# Patient Record
Sex: Female | Born: 1945 | Race: White | Hispanic: No | Marital: Single | Smoking: Never smoker
Health system: Southern US, Community
[De-identification: ages and names within clinical notes are randomized; demographics above are authoritative.]

## PROBLEM LIST (undated history)

## (undated) DIAGNOSIS — E785 Hyperlipidemia, unspecified: Secondary | ICD-10-CM

## (undated) DIAGNOSIS — I2510 Atherosclerotic heart disease of native coronary artery without angina pectoris: Secondary | ICD-10-CM

## (undated) DIAGNOSIS — N189 Chronic kidney disease, unspecified: Secondary | ICD-10-CM

## (undated) DIAGNOSIS — E039 Hypothyroidism, unspecified: Secondary | ICD-10-CM

## (undated) DIAGNOSIS — T148XXA Other injury of unspecified body region, initial encounter: Secondary | ICD-10-CM

## (undated) DIAGNOSIS — I10 Essential (primary) hypertension: Secondary | ICD-10-CM

## (undated) DIAGNOSIS — E669 Obesity, unspecified: Secondary | ICD-10-CM

## (undated) DIAGNOSIS — R42 Dizziness and giddiness: Secondary | ICD-10-CM

## (undated) DIAGNOSIS — I4891 Unspecified atrial fibrillation: Secondary | ICD-10-CM

## (undated) DIAGNOSIS — E079 Disorder of thyroid, unspecified: Secondary | ICD-10-CM

## (undated) DIAGNOSIS — R011 Cardiac murmur, unspecified: Secondary | ICD-10-CM

## (undated) DIAGNOSIS — M109 Gout, unspecified: Secondary | ICD-10-CM

## (undated) DIAGNOSIS — I340 Nonrheumatic mitral (valve) insufficiency: Secondary | ICD-10-CM

## (undated) DIAGNOSIS — M8580 Other specified disorders of bone density and structure, unspecified site: Secondary | ICD-10-CM

## (undated) HISTORY — DX: Other injury of unspecified body region, initial encounter: T14.8XXA

## (undated) HISTORY — DX: Obesity, unspecified: E66.9

## (undated) HISTORY — DX: Essential (primary) hypertension: I10

## (undated) HISTORY — DX: Disorder of thyroid, unspecified: E07.9

## (undated) HISTORY — DX: Other specified disorders of bone density and structure, unspecified site: M85.80

## (undated) HISTORY — DX: Chronic kidney disease, unspecified: N18.9

## (undated) HISTORY — DX: Cardiac murmur, unspecified: R01.1

## (undated) HISTORY — DX: Gout, unspecified: M10.9

## (undated) HISTORY — DX: Hyperlipidemia, unspecified: E78.5

---

## 2004-11-24 ENCOUNTER — Emergency Department: Payer: Self-pay | Admitting: Emergency Medicine

## 2006-01-29 ENCOUNTER — Ambulatory Visit: Payer: Self-pay | Admitting: Unknown Physician Specialty

## 2008-04-16 HISTORY — PX: OTHER SURGICAL HISTORY: SHX169

## 2008-08-14 DIAGNOSIS — I482 Chronic atrial fibrillation, unspecified: Secondary | ICD-10-CM | POA: Insufficient documentation

## 2009-02-24 ENCOUNTER — Ambulatory Visit: Payer: Self-pay | Admitting: Unknown Physician Specialty

## 2010-06-08 ENCOUNTER — Ambulatory Visit: Payer: Self-pay

## 2011-08-06 ENCOUNTER — Ambulatory Visit: Payer: Self-pay

## 2011-10-12 ENCOUNTER — Ambulatory Visit: Payer: Self-pay | Admitting: Cardiovascular Disease

## 2011-10-12 HISTORY — PX: CARDIAC CATHETERIZATION: SHX172

## 2012-03-14 ENCOUNTER — Ambulatory Visit: Payer: Self-pay | Admitting: Emergency Medicine

## 2012-06-02 HISTORY — PX: UPPER GI ENDOSCOPY: SHX6162

## 2012-08-13 DIAGNOSIS — E669 Obesity, unspecified: Secondary | ICD-10-CM | POA: Insufficient documentation

## 2012-08-13 DIAGNOSIS — E079 Disorder of thyroid, unspecified: Secondary | ICD-10-CM | POA: Insufficient documentation

## 2012-08-13 DIAGNOSIS — E063 Autoimmune thyroiditis: Secondary | ICD-10-CM | POA: Insufficient documentation

## 2012-08-13 DIAGNOSIS — I10 Essential (primary) hypertension: Secondary | ICD-10-CM | POA: Insufficient documentation

## 2012-08-14 DIAGNOSIS — Z8719 Personal history of other diseases of the digestive system: Secondary | ICD-10-CM | POA: Insufficient documentation

## 2012-08-14 DIAGNOSIS — M109 Gout, unspecified: Secondary | ICD-10-CM | POA: Insufficient documentation

## 2012-08-14 HISTORY — PX: HERNIA REPAIR: SHX51

## 2012-10-20 ENCOUNTER — Ambulatory Visit: Payer: Self-pay

## 2013-08-20 ENCOUNTER — Encounter: Payer: Self-pay | Admitting: *Deleted

## 2013-09-09 ENCOUNTER — Ambulatory Visit (INDEPENDENT_AMBULATORY_CARE_PROVIDER_SITE_OTHER): Payer: Medicare Other | Admitting: General Surgery

## 2013-09-09 ENCOUNTER — Other Ambulatory Visit: Payer: Medicare Other

## 2013-09-09 ENCOUNTER — Encounter: Payer: Self-pay | Admitting: General Surgery

## 2013-09-09 VITALS — BP 140/80 | HR 74 | Resp 14 | Ht 62.0 in | Wt 176.0 lb

## 2013-09-09 DIAGNOSIS — N641 Fat necrosis of breast: Secondary | ICD-10-CM

## 2013-09-09 DIAGNOSIS — N63 Unspecified lump in unspecified breast: Secondary | ICD-10-CM
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
## 2013-09-09 NOTE — Patient Instructions (Signed)
Patient to return as needed. 

## 2013-09-10 ENCOUNTER — Encounter: Payer: Self-pay | Admitting: General Surgery

## 2013-09-10 DIAGNOSIS — N63 Unspecified lump in unspecified breast: Secondary | ICD-10-CM | POA: Insufficient documentation

## 2013-09-10 DIAGNOSIS — N641 Fat necrosis of breast: Secondary | ICD-10-CM | POA: Insufficient documentation

## 2013-10-21 ENCOUNTER — Ambulatory Visit: Payer: Self-pay

## 2014-02-15 ENCOUNTER — Encounter: Payer: Self-pay | Admitting: General Surgery

## 2014-03-08 ENCOUNTER — Ambulatory Visit: Payer: Self-pay | Admitting: Unknown Physician Specialty

## 2014-05-17 DIAGNOSIS — I2510 Atherosclerotic heart disease of native coronary artery without angina pectoris: Secondary | ICD-10-CM | POA: Diagnosis not present

## 2014-05-17 DIAGNOSIS — I4891 Unspecified atrial fibrillation: Secondary | ICD-10-CM | POA: Diagnosis not present

## 2014-05-17 DIAGNOSIS — E785 Hyperlipidemia, unspecified: Secondary | ICD-10-CM | POA: Diagnosis not present

## 2014-05-17 DIAGNOSIS — I340 Nonrheumatic mitral (valve) insufficiency: Secondary | ICD-10-CM | POA: Diagnosis not present

## 2014-05-21 DIAGNOSIS — I482 Chronic atrial fibrillation: Secondary | ICD-10-CM | POA: Diagnosis not present

## 2014-05-21 DIAGNOSIS — E785 Hyperlipidemia, unspecified: Secondary | ICD-10-CM | POA: Diagnosis not present

## 2014-05-21 DIAGNOSIS — E039 Hypothyroidism, unspecified: Secondary | ICD-10-CM | POA: Diagnosis not present

## 2014-05-21 DIAGNOSIS — E789 Disorder of lipoprotein metabolism, unspecified: Secondary | ICD-10-CM | POA: Diagnosis not present

## 2014-05-21 DIAGNOSIS — I129 Hypertensive chronic kidney disease with stage 1 through stage 4 chronic kidney disease, or unspecified chronic kidney disease: Secondary | ICD-10-CM | POA: Diagnosis not present

## 2014-05-21 DIAGNOSIS — O039 Complete or unspecified spontaneous abortion without complication: Secondary | ICD-10-CM | POA: Diagnosis not present

## 2014-06-03 DIAGNOSIS — G4733 Obstructive sleep apnea (adult) (pediatric): Secondary | ICD-10-CM | POA: Diagnosis not present

## 2014-06-25 DIAGNOSIS — G4733 Obstructive sleep apnea (adult) (pediatric): Secondary | ICD-10-CM | POA: Diagnosis not present

## 2014-07-01 DIAGNOSIS — I4891 Unspecified atrial fibrillation: Secondary | ICD-10-CM | POA: Diagnosis not present

## 2014-07-01 DIAGNOSIS — G4733 Obstructive sleep apnea (adult) (pediatric): Secondary | ICD-10-CM | POA: Diagnosis not present

## 2014-07-01 DIAGNOSIS — I2510 Atherosclerotic heart disease of native coronary artery without angina pectoris: Secondary | ICD-10-CM | POA: Diagnosis not present

## 2014-07-01 DIAGNOSIS — I10 Essential (primary) hypertension: Secondary | ICD-10-CM | POA: Diagnosis not present

## 2014-08-09 LAB — SURGICAL PATHOLOGY

## 2014-10-11 DIAGNOSIS — G4730 Sleep apnea, unspecified: Secondary | ICD-10-CM | POA: Diagnosis not present

## 2014-10-11 DIAGNOSIS — I2510 Atherosclerotic heart disease of native coronary artery without angina pectoris: Secondary | ICD-10-CM | POA: Diagnosis not present

## 2014-10-11 DIAGNOSIS — I4891 Unspecified atrial fibrillation: Secondary | ICD-10-CM | POA: Diagnosis not present

## 2014-10-31 ENCOUNTER — Other Ambulatory Visit: Payer: Self-pay | Admitting: Unknown Physician Specialty

## 2014-11-04 ENCOUNTER — Other Ambulatory Visit: Payer: Self-pay | Admitting: Family Medicine

## 2014-11-04 DIAGNOSIS — R011 Cardiac murmur, unspecified: Secondary | ICD-10-CM | POA: Insufficient documentation

## 2014-11-04 DIAGNOSIS — I129 Hypertensive chronic kidney disease with stage 1 through stage 4 chronic kidney disease, or unspecified chronic kidney disease: Secondary | ICD-10-CM

## 2014-11-04 DIAGNOSIS — N183 Chronic kidney disease, stage 3 unspecified: Secondary | ICD-10-CM | POA: Insufficient documentation

## 2014-11-04 DIAGNOSIS — E039 Hypothyroidism, unspecified: Secondary | ICD-10-CM

## 2014-11-04 DIAGNOSIS — E785 Hyperlipidemia, unspecified: Secondary | ICD-10-CM | POA: Insufficient documentation

## 2014-11-04 DIAGNOSIS — I482 Chronic atrial fibrillation, unspecified: Secondary | ICD-10-CM

## 2014-11-04 DIAGNOSIS — M109 Gout, unspecified: Secondary | ICD-10-CM

## 2014-11-04 DIAGNOSIS — M85852 Other specified disorders of bone density and structure, left thigh: Secondary | ICD-10-CM | POA: Insufficient documentation

## 2014-11-04 DIAGNOSIS — E669 Obesity, unspecified: Secondary | ICD-10-CM

## 2014-11-04 DIAGNOSIS — M8580 Other specified disorders of bone density and structure, unspecified site: Secondary | ICD-10-CM | POA: Insufficient documentation

## 2014-11-04 DIAGNOSIS — Z1231 Encounter for screening mammogram for malignant neoplasm of breast: Secondary | ICD-10-CM

## 2014-11-04 DIAGNOSIS — I340 Nonrheumatic mitral (valve) insufficiency: Secondary | ICD-10-CM | POA: Insufficient documentation

## 2014-11-08 ENCOUNTER — Encounter: Payer: Self-pay | Admitting: Unknown Physician Specialty

## 2014-11-08 ENCOUNTER — Ambulatory Visit (INDEPENDENT_AMBULATORY_CARE_PROVIDER_SITE_OTHER): Payer: Medicare Other | Admitting: Unknown Physician Specialty

## 2014-11-08 VITALS — BP 141/84 | HR 65 | Temp 98.4°F | Ht 60.5 in | Wt 182.6 lb

## 2014-11-08 DIAGNOSIS — E785 Hyperlipidemia, unspecified: Secondary | ICD-10-CM

## 2014-11-08 DIAGNOSIS — N182 Chronic kidney disease, stage 2 (mild): Secondary | ICD-10-CM

## 2014-11-08 DIAGNOSIS — N185 Chronic kidney disease, stage 5: Secondary | ICD-10-CM | POA: Diagnosis not present

## 2014-11-08 DIAGNOSIS — N189 Chronic kidney disease, unspecified: Secondary | ICD-10-CM

## 2014-11-08 DIAGNOSIS — N181 Chronic kidney disease, stage 1: Secondary | ICD-10-CM

## 2014-11-08 DIAGNOSIS — N183 Chronic kidney disease, stage 3 (moderate): Secondary | ICD-10-CM | POA: Diagnosis not present

## 2014-11-08 DIAGNOSIS — E039 Hypothyroidism, unspecified: Secondary | ICD-10-CM | POA: Diagnosis not present

## 2014-11-08 DIAGNOSIS — E669 Obesity, unspecified: Secondary | ICD-10-CM | POA: Diagnosis not present

## 2014-11-08 DIAGNOSIS — N184 Chronic kidney disease, stage 4 (severe): Secondary | ICD-10-CM | POA: Diagnosis not present

## 2014-11-08 DIAGNOSIS — I129 Hypertensive chronic kidney disease with stage 1 through stage 4 chronic kidney disease, or unspecified chronic kidney disease: Secondary | ICD-10-CM

## 2014-11-08 LAB — LIPID PANEL PICCOLO, WAIVED
Chol/HDL Ratio Piccolo,Waive: 1.8 mg/dL
Cholesterol Piccolo, Waived: 130 mg/dL (ref <200)
HDL Chol Piccolo, Waived: 71 mg/dL (ref >59)
LDL Chol Calc Piccolo Waived: 41 mg/dL (ref <100)
Triglycerides Piccolo,Waived: 89 mg/dL (ref <150)
VLDL Chol Calc Piccolo,Waive: 18 mg/dL (ref <30)

## 2014-11-08 LAB — MICROALBUMIN, URINE WAIVED
Creatinine, Urine Waived: 50 mg/dL (ref 10–300)
Microalb, Ur Waived: 10 mg/L (ref 0–19)
Microalb/Creat Ratio: <30 mg/g (ref <30)

## 2014-11-08 LAB — BAYER DCA HB A1C WAIVED: HB A1C (BAYER DCA - WAIVED): 5.7 % (ref <7.0)

## 2014-11-08 MED ORDER — LEVOTHYROXINE SODIUM 75 MCG PO TABS: 75.0000 ug | ORAL_TABLET | Freq: Every day | ORAL | Status: DC

## 2014-11-08 MED ORDER — AMLODIPINE BESYLATE 2.5 MG PO TABS: 2.5000 mg | ORAL_TABLET | Freq: Every day | ORAL | Status: DC

## 2014-11-08 NOTE — Progress Notes (Signed)
BP 141/84 mmHg  Pulse 65  Temp(Src) 98.4 F (36.9 C)  Ht 5' 0.5" (1.537 m)  Wt 182 lb 9.6 oz (82.827 kg)  BMI 35.06 kg/m2  SpO2 98%  LMP  (LMP Unknown)   Subjective:    Patient ID: Amy Myers, female    DOB: 29-Aug-1945, 69 y.o.   MRN: [id number]  HPI: Amy Myers is a 69 y.o. female  Chief Complaint  Patient presents with  . Hypertension  . Hyperlipidemia    HYPERTENSION / HYPERLIPIDEMIA Satisfied with current treatment?  yes  H6  Duration of hypertension:  chronic  H4  BP monitoring frequency:  a few times a week  H5  BP range:  130s  60-70s H3 BP medication side effects:  no P1 Duration of hyperlipidemia:  chronic  H4  Cholesterol medication side effects:  no P1  Cholesterol supplements:  none  P1 Past cholesterol medications:  none  P1 Medication compliance:  excellent compliance  P1  Aspirin:  Aspirin Therapy Done on 10/31/10 Recent stressors:  no  H6   Recurrent headaches:  no  R10 Visual changes:  no  R2  Palpitations:  yes  R4  Dyspnea:  no  R5  Chest pain:  no  R4  Lower extremity edema:  no  R4  Dizzy/lightheaded:  no  R10    Relevant past medical, surgical, family and social history reviewed and updated as indicated. Interim medical history since our last visit reviewed. Allergies and medications reviewed and updated.  Review of Systems  Per HPI unless specifically indicated above     Objective:    BP 141/84 mmHg  Pulse 65  Temp(Src) 98.4 F (36.9 C)  Ht 5' 0.5" (1.537 m)  Wt 182 lb 9.6 oz (82.827 kg)  BMI 35.06 kg/m2  SpO2 98%  LMP  (LMP Unknown)  Wt Readings from Last 3 Encounters:  11/08/14 182 lb 9.6 oz (82.827 kg)  05/21/14 178 lb (80.74 kg)  09/09/13 176 lb (79.833 kg)    Physical Exam  Constitutional: She is oriented to person, place, and time. She appears well-developed and well-nourished. No distress.  HENT:  Head: Normocephalic and atraumatic.  Eyes: Conjunctivae and lids are normal. Right eye exhibits no discharge.  Left eye exhibits no discharge. No scleral icterus.  Cardiovascular: Normal rate, regular rhythm and normal heart sounds.   Pulmonary/Chest: Effort normal and breath sounds normal. No respiratory distress.  Abdominal: Normal appearance. There is no splenomegaly or hepatomegaly.  Musculoskeletal: Normal range of motion.  Neurological: She is alert and oriented to person, place, and time.  Skin: Skin is intact. No rash noted. No pallor.  Psychiatric: She has a normal mood and affect. Her behavior is normal. Judgment and thought content normal.  Vitals reviewed.     Assessment & Plan:   Problem List Items Addressed This Visit      Unprioritized   Hypertensive CKD (chronic kidney disease)   Relevant Medications   amLODipine (NORVASC) 2.5 MG tablet   Other Relevant Orders   Microalbumin, Urine Waived   Uric acid   Comprehensive metabolic panel   Obesity   Relevant Orders   Bayer DCA Hb A1c Waived   Hyperlipidemia - Primary   Relevant Medications   amLODipine (NORVASC) 2.5 MG tablet   Other Relevant Orders   Lipid Panel Piccolo, Waived   Hypothyroidism   Relevant Medications   levothyroxine (SYNTHROID, LEVOTHROID) 75 MCG tablet   Other Relevant Orders   TSH  All stable.  Continue present meds.  Await TSH   Follow up plan: Return in about 4 weeks (around 12/06/2014) for physical.

## 2014-11-08 NOTE — Patient Instructions (Signed)

## 2014-11-09 ENCOUNTER — Other Ambulatory Visit: Payer: Self-pay | Admitting: Family Medicine

## 2014-11-09 ENCOUNTER — Encounter: Payer: Self-pay | Admitting: Unknown Physician Specialty

## 2014-11-09 ENCOUNTER — Ambulatory Visit
Admission: RE | Admit: 2014-11-09 | Discharge: 2014-11-09 | Disposition: A | Discharge status: Home / Self Care | Payer: Medicare Other | Source: Ambulatory Visit | Attending: Family Medicine | Admitting: Family Medicine

## 2014-11-09 DIAGNOSIS — Z1231 Encounter for screening mammogram for malignant neoplasm of breast: Secondary | ICD-10-CM | POA: Diagnosis not present

## 2014-11-09 LAB — COMPREHENSIVE METABOLIC PANEL
ALT: 8 IU/L (ref 0–32)
AST: 11 IU/L (ref 0–40)
Albumin/Globulin Ratio: 2 (ref 1.1–2.5)
Albumin: 4.1 g/dL (ref 3.5–4.8)
Alkaline Phosphatase: 70 IU/L (ref 39–117)
BUN/Creatinine Ratio: 14 (ref 11–26)
BUN: 15 mg/dL (ref 8–27)
Bilirubin Total: 0.4 mg/dL (ref 0.0–1.2)
CO2: 21 mmol/L (ref 18–29)
Calcium: 9.2 mg/dL (ref 8.7–10.3)
Chloride: 100 mmol/L (ref 97–108)
Creatinine, Ser: 1.04 mg/dL — ABNORMAL HIGH (ref 0.57–1.00)
GFR calc Af Amer: 63 mL/min/{1.73_m2} (ref >59)
GFR calc non Af Amer: 55 mL/min/{1.73_m2} — ABNORMAL LOW (ref >59)
Globulin, Total: 2.1 g/dL (ref 1.5–4.5)
Glucose: 94 mg/dL (ref 65–99)
Potassium: 4.5 mmol/L (ref 3.5–5.2)
Sodium: 139 mmol/L (ref 134–144)
Total Protein: 6.2 g/dL (ref 6.0–8.5)

## 2014-11-09 LAB — URIC ACID: Uric Acid: 4.4 mg/dL (ref 2.5–7.1)

## 2014-11-09 LAB — TSH: TSH: 1.58 u[IU]/mL (ref 0.450–4.500)

## 2014-11-30 ENCOUNTER — Other Ambulatory Visit: Payer: Self-pay | Admitting: Unknown Physician Specialty

## 2014-12-08 ENCOUNTER — Encounter: Payer: Medicare Other | Admitting: Unknown Physician Specialty

## 2014-12-13 ENCOUNTER — Other Ambulatory Visit: Payer: Self-pay

## 2014-12-13 DIAGNOSIS — E039 Hypothyroidism, unspecified: Secondary | ICD-10-CM

## 2014-12-13 MED ORDER — RALOXIFENE HCL 60 MG PO TABS: 60.0000 mg | ORAL_TABLET | Freq: Every day | ORAL | Status: DC

## 2014-12-13 NOTE — Telephone Encounter (Signed)
Patient was last seen on 11/08/14 and pharmacy is [facility].

## 2014-12-24 ENCOUNTER — Ambulatory Visit (INDEPENDENT_AMBULATORY_CARE_PROVIDER_SITE_OTHER): Payer: Medicare Other | Admitting: Unknown Physician Specialty

## 2014-12-24 ENCOUNTER — Encounter: Payer: Self-pay | Admitting: Unknown Physician Specialty

## 2014-12-24 VITALS — BP 139/81 | HR 61 | Temp 98.4°F | Ht 60.7 in | Wt 184.0 lb

## 2014-12-24 DIAGNOSIS — N183 Chronic kidney disease, stage 3 (moderate): Secondary | ICD-10-CM

## 2014-12-24 DIAGNOSIS — N185 Chronic kidney disease, stage 5: Secondary | ICD-10-CM

## 2014-12-24 DIAGNOSIS — N181 Chronic kidney disease, stage 1: Secondary | ICD-10-CM

## 2014-12-24 DIAGNOSIS — N182 Chronic kidney disease, stage 2 (mild): Secondary | ICD-10-CM | POA: Diagnosis not present

## 2014-12-24 DIAGNOSIS — I129 Hypertensive chronic kidney disease with stage 1 through stage 4 chronic kidney disease, or unspecified chronic kidney disease: Secondary | ICD-10-CM

## 2014-12-24 DIAGNOSIS — N189 Chronic kidney disease, unspecified: Secondary | ICD-10-CM

## 2014-12-24 DIAGNOSIS — N184 Chronic kidney disease, stage 4 (severe): Secondary | ICD-10-CM | POA: Diagnosis not present

## 2014-12-24 DIAGNOSIS — E039 Hypothyroidism, unspecified: Secondary | ICD-10-CM

## 2014-12-24 DIAGNOSIS — Z0000 Encounter for general adult medical examination without abnormal findings: Secondary | ICD-10-CM

## 2014-12-24 MED ORDER — ALLOPURINOL 100 MG PO TABS: 100.0000 mg | ORAL_TABLET | Freq: Every day | ORAL | Status: DC

## 2014-12-24 MED ORDER — LEVOTHYROXINE SODIUM 75 MCG PO TABS: 75.0000 ug | ORAL_TABLET | Freq: Every day | ORAL | Status: DC

## 2014-12-24 MED ORDER — RALOXIFENE HCL 60 MG PO TABS: 60.0000 mg | ORAL_TABLET | Freq: Every day | ORAL | Status: DC

## 2014-12-24 MED ORDER — AMLODIPINE BESYLATE 2.5 MG PO TABS: 2.5000 mg | ORAL_TABLET | Freq: Every day | ORAL | Status: DC

## 2014-12-24 NOTE — Progress Notes (Signed)
BP 139/81 mmHg  Pulse 61  Temp(Src) 98.4 F (36.9 C)  Ht 5' 0.7" (1.542 m)  Wt 184 lb (83.462 kg)  BMI 35.10 kg/m2  SpO2 98%  LMP  (LMP Unknown)   Subjective:    Patient ID: Amy Myers, female    DOB: [birth year], 69 y.o.   MRN: [id number]  HPI: Amy Myers is a 69 y.o. female  Chief Complaint  Patient presents with  . Medicare Wellness   Relevant past medical, surgical, family and social history reviewed and updated as indicated. Interim medical history since our last visit reviewed. Allergies and medications reviewed and updated.  See functional status, depression screen, and fall's risk assessment  under the appropriate section.  Care team updated  Pt is able to perform complex mental tasks, recognize clock face, recognize time and do a 3 item recall.    Advance Directives:  Pt has not had power of attorney or advance directive forms.  She does want to be resucitated if she has a cardiac or respiratory arrest.    Relevant past medical, surgical, family and social history reviewed and updated as indicated. Interim medical history since our last visit reviewed. Allergies and medications reviewed and updated.  Review of Systems  Constitutional: Negative.   HENT: Negative.   Eyes: Negative.   Respiratory: Negative.   Cardiovascular: Negative.   Gastrointestinal: Negative.   Endocrine: Negative.   Genitourinary: Negative.   Musculoskeletal: Negative.   Skin: Negative.   Allergic/Immunologic: Negative.   Neurological: Negative.   Hematological: Negative.   Psychiatric/Behavioral: Negative.     Per HPI unless specifically indicated above     Objective:    BP 139/81 mmHg  Pulse 61  Temp(Src) 98.4 F (36.9 C)  Ht 5' 0.7" (1.542 m)  Wt 184 lb (83.462 kg)  BMI 35.10 kg/m2  SpO2 98%  LMP  (LMP Unknown)  Wt Readings from Last 3 Encounters:  12/24/14 184 lb (83.462 kg)  11/08/14 182 lb 9.6 oz (82.827 kg)  05/21/14 178 lb (80.74 kg)    Physical Exam   Constitutional: She is oriented to person, place, and time. She appears well-developed and well-nourished.  HENT:  Head: Normocephalic and atraumatic.  Eyes: Pupils are equal, round, and reactive to light. Right eye exhibits no discharge. Left eye exhibits no discharge. No scleral icterus.  Neck: Normal range of motion. Neck supple. Carotid bruit is not present. No thyromegaly present.  Cardiovascular: Normal rate, regular rhythm and normal heart sounds.  Exam reveals no gallop and no friction rub.   No murmur heard. Pulmonary/Chest: Effort normal and breath sounds normal. No respiratory distress. She has no wheezes. She has no rales.  Abdominal: Soft. Bowel sounds are normal. There is no tenderness. There is no rebound.  Genitourinary: No breast swelling, tenderness or discharge.  Musculoskeletal: Normal range of motion.  Lymphadenopathy:    She has no cervical adenopathy.  Neurological: She is alert and oriented to person, place, and time.  Skin: Skin is warm, dry and intact. No rash noted.  Psychiatric: She has a normal mood and affect. Her speech is normal and behavior is normal. Judgment and thought content normal. Cognition and memory are normal.  Nursing note and vitals reviewed.   Results for orders placed or performed in visit on 11/08/14  Lipid Panel Piccolo, [employer]  Result Value Ref Range   Cholesterol Piccolo, Waived 130 <200 mg/dL   HDL Chol Piccolo, Waived 71 >59 mg/dL   Triglycerides Piccolo,Waived 89 <150  mg/dL   Chol/HDL Ratio Piccolo,Waive 1.8 mg/dL   LDL Chol Calc Piccolo Waived 41 <100 mg/dL   VLDL Chol Calc Piccolo,Waive 18 <30 mg/dL  Bayer DCA Hb A1c Waived  Result Value Ref Range   Bayer DCA Hb A1c Waived 5.7 <7.0 %  Microalbumin, Urine Waived  Result Value Ref Range   Microalb, Ur Waived 10 0 - 19 mg/L   Creatinine, Urine Waived 50 10 - 300 mg/dL   Microalb/Creat Ratio <30 <30 mg/g  Uric acid  Result Value Ref Range   Uric Acid 4.4 2.5 - 7.1 mg/dL   Comprehensive metabolic panel  Result Value Ref Range   Glucose 94 65 - 99 mg/dL   BUN 15 8 - 27 mg/dL   Creatinine, Ser 1.04 (H) 0.57 - 1.00 mg/dL   GFR calc non Af Amer 55 (L) >59 mL/min/1.73   GFR calc Af Amer 63 >59 mL/min/1.73   BUN/Creatinine Ratio 14 11 - 26   Sodium 139 134 - 144 mmol/L   Potassium 4.5 3.5 - 5.2 mmol/L   Chloride 100 97 - 108 mmol/L   CO2 21 18 - 29 mmol/L   Calcium 9.2 8.7 - 10.3 mg/dL   Total Protein 6.2 6.0 - 8.5 g/dL   Albumin 4.1 3.5 - 4.8 g/dL   Globulin, Total 2.1 1.5 - 4.5 g/dL   Albumin/Globulin Ratio 2.0 1.1 - 2.5   Bilirubin Total 0.4 0.0 - 1.2 mg/dL   Alkaline Phosphatase 70 39 - 117 IU/L   AST 11 0 - 40 IU/L   ALT 8 0 - 32 IU/L  TSH  Result Value Ref Range   TSH 1.580 0.450 - 4.500 uIU/mL      Assessment & Plan:   Medicare wellness  Follow up plan: Return in about 6 months (around 06/23/2015).

## 2015-01-11 DIAGNOSIS — I10 Essential (primary) hypertension: Secondary | ICD-10-CM | POA: Diagnosis not present

## 2015-01-11 DIAGNOSIS — I340 Nonrheumatic mitral (valve) insufficiency: Secondary | ICD-10-CM | POA: Diagnosis not present

## 2015-01-11 DIAGNOSIS — I2510 Atherosclerotic heart disease of native coronary artery without angina pectoris: Secondary | ICD-10-CM | POA: Diagnosis not present

## 2015-01-11 DIAGNOSIS — I4891 Unspecified atrial fibrillation: Secondary | ICD-10-CM | POA: Diagnosis not present

## 2015-01-11 DIAGNOSIS — I341 Nonrheumatic mitral (valve) prolapse: Secondary | ICD-10-CM | POA: Diagnosis not present

## 2015-01-31 ENCOUNTER — Other Ambulatory Visit: Payer: Self-pay

## 2015-01-31 MED ORDER — ALLOPURINOL 100 MG PO TABS: 100.0000 mg | ORAL_TABLET | Freq: Every day | ORAL | Status: DC

## 2015-01-31 NOTE — Telephone Encounter (Signed)
PATIENT: Amy Myers DOB: [birth year] PHARMACY: [facility] LAST VISIT: 12/24/2014  Patient requests allopurinol 100 mg tab.   I contacted pharmacy because the prescription was written 12/24/2014 with 5 refills and the directions say take one tablet daily. The pharmacist says that she's been taking two tablets a day for a while, and if she was supposed to take only one now, that wasn't clear to her and she ran out early. This is why the pharmacist faxed in a new request with directions 2 tablets daily. I told pharmacist I would clarify with you what amount you wanted patient to take.

## 2015-02-03 ENCOUNTER — Other Ambulatory Visit: Payer: Self-pay

## 2015-02-03 NOTE — Telephone Encounter (Signed)
PATIENT: Amy Myers LAST VISIT: 12/21/2014  [name]

## 2015-02-04 MED ORDER — LISINOPRIL 40 MG PO TABS: 40.0000 mg | ORAL_TABLET | Freq: Every day | ORAL | Status: DC

## 2015-03-01 ENCOUNTER — Telehealth: Payer: Self-pay

## 2015-03-01 NOTE — Telephone Encounter (Signed)
Pharmacy sent a fax stating that the patient's prescription for allopurinol was written to take one tablet once daily but the patient is supposed to be taking 2 tablets daily. They request a new prescription be sent with the correct directions.

## 2015-03-01 NOTE — Telephone Encounter (Signed)
OK to wait for Amy Myers 

## 2015-03-02 MED ORDER — ALLOPURINOL 100 MG PO TABS: 100.0000 mg | ORAL_TABLET | Freq: Two times a day (BID) | ORAL | Status: DC

## 2015-06-13 DIAGNOSIS — I2510 Atherosclerotic heart disease of native coronary artery without angina pectoris: Secondary | ICD-10-CM | POA: Diagnosis not present

## 2015-06-13 DIAGNOSIS — I10 Essential (primary) hypertension: Secondary | ICD-10-CM | POA: Diagnosis not present

## 2015-06-13 DIAGNOSIS — I4891 Unspecified atrial fibrillation: Secondary | ICD-10-CM | POA: Diagnosis not present

## 2015-06-22 DIAGNOSIS — R079 Chest pain, unspecified: Secondary | ICD-10-CM | POA: Diagnosis not present

## 2015-06-24 ENCOUNTER — Ambulatory Visit (INDEPENDENT_AMBULATORY_CARE_PROVIDER_SITE_OTHER): Payer: Medicare Other | Admitting: Unknown Physician Specialty

## 2015-06-24 ENCOUNTER — Telehealth: Payer: Self-pay

## 2015-06-24 ENCOUNTER — Encounter: Payer: Self-pay | Admitting: Unknown Physician Specialty

## 2015-06-24 VITALS — BP 121/82 | HR 76 | Temp 97.8°F | Ht 59.3 in | Wt 184.2 lb

## 2015-06-24 DIAGNOSIS — N183 Chronic kidney disease, stage 3 unspecified: Secondary | ICD-10-CM

## 2015-06-24 DIAGNOSIS — I129 Hypertensive chronic kidney disease with stage 1 through stage 4 chronic kidney disease, or unspecified chronic kidney disease: Secondary | ICD-10-CM

## 2015-06-24 DIAGNOSIS — E785 Hyperlipidemia, unspecified: Secondary | ICD-10-CM | POA: Diagnosis not present

## 2015-06-24 LAB — LIPID PANEL PICCOLO, WAIVED
Chol/HDL Ratio Piccolo,Waive: 1.8 mg/dL
Cholesterol Piccolo, Waived: 128 mg/dL (ref <200)
HDL Chol Piccolo, Waived: 71 mg/dL (ref >59)
LDL Chol Calc Piccolo Waived: 41 mg/dL (ref <100)
Triglycerides Piccolo,Waived: 84 mg/dL (ref <150)
VLDL Chol Calc Piccolo,Waive: 17 mg/dL (ref <30)

## 2015-06-24 NOTE — Telephone Encounter (Signed)
Patient came in for an appointment and stated she got a flu shot at [facility] so I called them and they stated the patient got her flu shot 01/13/15.

## 2015-06-24 NOTE — Progress Notes (Signed)
----------------------------------------------------------------------------  BP 121/82 mmHg  Pulse 76  Temp(Src) 97.8 F (36.6 C)  Ht 4' 11.3" (1.506 m)  Wt 184 lb 3.2 oz (83.553 kg)  BMI 36.84 kg/m2  SpO2 97%  LMP  (LMP Unknown)   Subjective:    Patient ID: Amy Myers, female    DOB: [birth year], 70 y.o.   MRN: [id number]  HPI: Amy Myers is a 70 y.o. female  Chief Complaint  Patient presents with  . Hyperlipidemia  . Hypertension  . Hypothyroidism   Hypertension Using medications without difficulty.   Average home BP: 122/70  No problems or lightheadedness No chest pain with exertion or shortness of breath No Edema   Hyperlipidemia Using medications without problems No Muscle aches  Diet compliance: Mostly eats well Exercise: "a little bit"   Relevant past medical, surgical, family and social history reviewed and updated as indicated. Interim medical history since our last visit reviewed. Allergies and medications reviewed and updated.  Review of Systems  Per HPI unless specifically indicated above     Objective:    BP 121/82 mmHg  Pulse 76  Temp(Src) 97.8 F (36.6 C)  Ht 4' 11.3" (1.506 m)  Wt 184 lb 3.2 oz (83.553 kg)  BMI 36.84 kg/m2  SpO2 97%  LMP  (LMP Unknown)  Wt Readings from Last 3 Encounters:  06/24/15 184 lb 3.2 oz (83.553 kg)  12/24/14 184 lb (83.462 kg)  11/08/14 182 lb 9.6 oz (82.827 kg)    Physical Exam  Constitutional: She is oriented to person, place, and time. She appears well-developed and well-nourished. No distress.  HENT:  Head: Normocephalic and atraumatic.  Eyes: Conjunctivae and lids are normal. Right eye exhibits no discharge. Left eye exhibits no discharge. No scleral icterus.  Neck: Normal range of motion. Neck supple. No JVD present. Carotid bruit is not present.  Cardiovascular: Normal rate, regular rhythm and normal heart sounds.   Pulmonary/Chest: Effort normal and breath sounds normal.  Abdominal: Normal  appearance. There is no splenomegaly or hepatomegaly.  Musculoskeletal: Normal range of motion.  Neurological: She is alert and oriented to person, place, and time.  Skin: Skin is warm, dry and intact. No rash noted. No pallor.  Psychiatric: She has a normal mood and affect. Her behavior is normal. Judgment and thought content normal.    Results for orders placed or performed in visit on 11/08/14  Lipid Panel Piccolo, [employer]  Result Value Ref Range   Cholesterol Piccolo, Waived 130 <200 mg/dL   HDL Chol Piccolo, Waived 71 >59 mg/dL   Triglycerides Piccolo,Waived 89 <150 mg/dL   Chol/HDL Ratio Piccolo,Waive 1.8 mg/dL   LDL Chol Calc Piccolo Waived 41 <100 mg/dL   VLDL Chol Calc Piccolo,Waive 18 <30 mg/dL  Bayer DCA Hb A1c Waived  Result Value Ref Range   Bayer DCA Hb A1c Waived 5.7 <7.0 %  Microalbumin, Urine Waived  Result Value Ref Range   Microalb, Ur Waived 10 0 - 19 mg/L   Creatinine, Urine Waived 50 10 - 300 mg/dL   Microalb/Creat Ratio <30 <30 mg/g  Uric acid  Result Value Ref Range   Uric Acid 4.4 2.5 - 7.1 mg/dL  Comprehensive metabolic panel  Result Value Ref Range   Glucose 94 65 - 99 mg/dL   BUN 15 8 - 27 mg/dL   Creatinine, Ser 1.04 (H) 0.57 - 1.00 mg/dL   GFR calc non Af Amer 55 (L) >59 mL/min/1.73   GFR calc Af Amer 63 >59 mL/min/1.73  BUN/Creatinine Ratio 14 11 - 26   Sodium 139 134 - 144 mmol/L   Potassium 4.5 3.5 - 5.2 mmol/L   Chloride 100 97 - 108 mmol/L   CO2 21 18 - 29 mmol/L   Calcium 9.2 8.7 - 10.3 mg/dL   Total Protein 6.2 6.0 - 8.5 g/dL   Albumin 4.1 3.5 - 4.8 g/dL   Globulin, Total 2.1 1.5 - 4.5 g/dL   Albumin/Globulin Ratio 2.0 1.1 - 2.5   Bilirubin Total 0.4 0.0 - 1.2 mg/dL   Alkaline Phosphatase 70 39 - 117 IU/L   AST 11 0 - 40 IU/L   ALT 8 0 - 32 IU/L  TSH  Result Value Ref Range   TSH 1.580 0.450 - 4.500 uIU/mL      Assessment & Plan:   Problem List Items Addressed This Visit      Unprioritized   Hyperlipidemia    Reviewed  lipid panel.  LDL was 71.    Continue present medications.         Relevant Orders   Lipid Panel Piccolo, Waived   Benign hypertension with chronic kidney disease, stage III - Primary    Check CMP.  BP stable.  Continue present meds      Relevant Orders   Comprehensive metabolic panel       Follow up plan: Return in about 6 months (around 12/25/2015) for physicak.

## 2015-06-24 NOTE — Assessment & Plan Note (Signed)
Check CMP.  BP stable.  Continue present meds

## 2015-06-24 NOTE — Assessment & Plan Note (Signed)
Reviewed lipid panel.  LDL was 71.    Continue present medications.

## 2015-06-25 LAB — COMPREHENSIVE METABOLIC PANEL
ALT: 6 IU/L (ref 0–32)
AST: 8 IU/L (ref 0–40)
Albumin/Globulin Ratio: 1.8 (ref 1.1–2.5)
Albumin: 4.1 g/dL (ref 3.5–4.8)
Alkaline Phosphatase: 64 IU/L (ref 39–117)
BUN/Creatinine Ratio: 14 (ref 11–26)
BUN: 17 mg/dL (ref 8–27)
Bilirubin Total: 0.5 mg/dL (ref 0.0–1.2)
CO2: 22 mmol/L (ref 18–29)
Calcium: 9.4 mg/dL (ref 8.7–10.3)
Chloride: 100 mmol/L (ref 96–106)
Creatinine, Ser: 1.21 mg/dL — ABNORMAL HIGH (ref 0.57–1.00)
GFR calc Af Amer: 52 mL/min/{1.73_m2} — ABNORMAL LOW (ref >59)
GFR calc non Af Amer: 45 mL/min/{1.73_m2} — ABNORMAL LOW (ref >59)
Globulin, Total: 2.3 g/dL (ref 1.5–4.5)
Glucose: 101 mg/dL — ABNORMAL HIGH (ref 65–99)
Potassium: 4.7 mmol/L (ref 3.5–5.2)
Sodium: 137 mmol/L (ref 134–144)
Total Protein: 6.4 g/dL (ref 6.0–8.5)

## 2015-06-27 DIAGNOSIS — I4891 Unspecified atrial fibrillation: Secondary | ICD-10-CM | POA: Diagnosis not present

## 2015-06-27 DIAGNOSIS — I10 Essential (primary) hypertension: Secondary | ICD-10-CM | POA: Diagnosis not present

## 2015-06-27 DIAGNOSIS — I2510 Atherosclerotic heart disease of native coronary artery without angina pectoris: Secondary | ICD-10-CM | POA: Diagnosis not present

## 2015-10-03 ENCOUNTER — Other Ambulatory Visit: Payer: Self-pay | Admitting: Unknown Physician Specialty

## 2015-10-03 ENCOUNTER — Other Ambulatory Visit: Payer: Self-pay | Admitting: Family Medicine

## 2015-10-03 DIAGNOSIS — Z1231 Encounter for screening mammogram for malignant neoplasm of breast: Secondary | ICD-10-CM

## 2015-10-19 DIAGNOSIS — I2510 Atherosclerotic heart disease of native coronary artery without angina pectoris: Secondary | ICD-10-CM | POA: Diagnosis not present

## 2015-10-19 DIAGNOSIS — G4730 Sleep apnea, unspecified: Secondary | ICD-10-CM | POA: Diagnosis not present

## 2015-10-19 DIAGNOSIS — I10 Essential (primary) hypertension: Secondary | ICD-10-CM | POA: Diagnosis not present

## 2015-10-19 DIAGNOSIS — I4891 Unspecified atrial fibrillation: Secondary | ICD-10-CM | POA: Diagnosis not present

## 2015-11-10 ENCOUNTER — Other Ambulatory Visit: Payer: Self-pay | Admitting: Family Medicine

## 2015-11-10 ENCOUNTER — Ambulatory Visit
Admission: RE | Admit: 2015-11-10 | Discharge: 2015-11-10 | Disposition: A | Discharge status: Home / Self Care | Payer: Medicare Other | Source: Ambulatory Visit | Attending: Family Medicine | Admitting: Family Medicine

## 2015-11-10 DIAGNOSIS — Z1231 Encounter for screening mammogram for malignant neoplasm of breast: Secondary | ICD-10-CM | POA: Insufficient documentation

## 2015-12-08 ENCOUNTER — Encounter (INDEPENDENT_AMBULATORY_CARE_PROVIDER_SITE_OTHER): Payer: Self-pay

## 2016-01-02 ENCOUNTER — Encounter: Payer: Self-pay | Admitting: Unknown Physician Specialty

## 2016-01-02 ENCOUNTER — Ambulatory Visit (INDEPENDENT_AMBULATORY_CARE_PROVIDER_SITE_OTHER): Payer: Medicare Other | Admitting: Unknown Physician Specialty

## 2016-01-02 VITALS — BP 120/80 | HR 150 | Temp 98.7°F | Ht 60.0 in | Wt 180.2 lb

## 2016-01-02 DIAGNOSIS — N63 Unspecified lump in unspecified breast: Secondary | ICD-10-CM

## 2016-01-02 DIAGNOSIS — I129 Hypertensive chronic kidney disease with stage 1 through stage 4 chronic kidney disease, or unspecified chronic kidney disease: Secondary | ICD-10-CM | POA: Diagnosis not present

## 2016-01-02 DIAGNOSIS — E039 Hypothyroidism, unspecified: Secondary | ICD-10-CM | POA: Diagnosis not present

## 2016-01-02 DIAGNOSIS — I482 Chronic atrial fibrillation, unspecified: Secondary | ICD-10-CM

## 2016-01-02 DIAGNOSIS — N183 Chronic kidney disease, stage 3 (moderate): Secondary | ICD-10-CM

## 2016-01-02 DIAGNOSIS — E785 Hyperlipidemia, unspecified: Secondary | ICD-10-CM | POA: Diagnosis not present

## 2016-01-02 DIAGNOSIS — Z0000 Encounter for general adult medical examination without abnormal findings: Secondary | ICD-10-CM

## 2016-01-02 DIAGNOSIS — M8580 Other specified disorders of bone density and structure, unspecified site: Secondary | ICD-10-CM | POA: Diagnosis not present

## 2016-01-02 MED ORDER — ALLOPURINOL 100 MG PO TABS: 100.0000 mg | ORAL_TABLET | Freq: Two times a day (BID) | ORAL | 5 refills | Status: DC

## 2016-01-02 MED ORDER — RALOXIFENE HCL 60 MG PO TABS: 60.0000 mg | ORAL_TABLET | Freq: Every day | ORAL | 5 refills | Status: DC

## 2016-01-02 MED ORDER — LISINOPRIL 40 MG PO TABS: 40.0000 mg | ORAL_TABLET | Freq: Every day | ORAL | 6 refills | Status: DC

## 2016-01-02 NOTE — Assessment & Plan Note (Signed)
Check lipid panel  

## 2016-01-02 NOTE — Assessment & Plan Note (Signed)
Evaluated by surgery. Recent mammogram negative

## 2016-01-02 NOTE — Assessment & Plan Note (Signed)
Stable, continue present medications.   

## 2016-01-02 NOTE — Assessment & Plan Note (Signed)
Check TSH 

## 2016-01-02 NOTE — Patient Instructions (Signed)
Please do call to schedule your bone density; the number to schedule one at either [facility] or [facility] is [phone]   

## 2016-01-02 NOTE — Progress Notes (Signed)
BP 120/80 (BP Location: Left Arm, Patient Position: Sitting, Cuff Size: Large)   Pulse (!) 150   Temp 98.7 F (37.1 C)   Ht 5' (1.524 m)   Wt 180 lb 3.2 oz (81.7 kg)   LMP  (LMP Unknown)   SpO2 96%   BMI 35.19 kg/m    Subjective:    Patient ID: Amy Myers, female    DOB: 05/19/1945, 70 y.o.   MRN: [id number]  HPI: Amy Myers is a 70 y.o. female  Pt is here for her Medicare physical and monitoring of chronic medical conditions Chief Complaint  Patient presents with  . Medicare Wellness    Hep C order entered   Hypertension Using medications without difficulty Average home BPs  SBP 120-130s   No problems or lightheadedness No chest pain with exertion or shortness of breath No Edema  Hyperlipidemia Using medications without problems: No Muscle aches  Diet compliance: "trying to" Exercise:  "trying to"  Hypothyroid No complaints of fatigue, weight loss or weight gain  Chronic a fib Taking Elquis 5 mg daily.  Sees Dr. Humphrey Rolls  Depression screen [facility] 2/9 01/02/2016 12/24/2014  Decreased Interest 0 0  Down, Depressed, Hopeless 0 0  PHQ - 2 Score 0 0   Fall Risk  01/02/2016 12/24/2014  Falls in the past year? No No   Mini cog was negative  Relevant past medical, surgical, family and social history reviewed and updated as indicated. Interim medical history since our last visit reviewed. Allergies and medications reviewed and updated.  Review of Systems  All other systems reviewed and are negative.   Per HPI unless specifically indicated above     Objective:    BP 120/80 (BP Location: Left Arm, Patient Position: Sitting, Cuff Size: Large)   Pulse (!) 150   Temp 98.7 F (37.1 C)   Ht 5' (1.524 m)   Wt 180 lb 3.2 oz (81.7 kg)   LMP  (LMP Unknown)   SpO2 96%   BMI 35.19 kg/m   Wt Readings from Last 3 Encounters:  01/02/16 180 lb 3.2 oz (81.7 kg)  06/24/15 184 lb 3.2 oz (83.6 kg)  12/24/14 184 lb (83.5 kg)    Pulse rechecked and down to  102  Physical Exam  Constitutional: She is oriented to person, place, and time. She appears well-developed and well-nourished.  HENT:  Head: Normocephalic and atraumatic.  Eyes: Pupils are equal, round, and reactive to light. Right eye exhibits no discharge. Left eye exhibits no discharge. No scleral icterus.  Neck: Normal range of motion. Neck supple. Carotid bruit is not present. No thyromegaly present.  Cardiovascular: Normal rate, S1 normal, S2 normal and normal heart sounds.  An irregularly irregular rhythm present. Exam reveals no gallop and no friction rub.   No murmur heard. Pulmonary/Chest: Effort normal and breath sounds normal. No respiratory distress. She has no wheezes. She has no rales.  Abdominal: Soft. Bowel sounds are normal. There is no tenderness. There is no rebound.  Genitourinary: No breast tenderness or discharge.  Genitourinary Comments: Lump right breast a 3 o clock.  Has been there for a period of time  Musculoskeletal: Normal range of motion.  Lymphadenopathy:    She has no cervical adenopathy.  Neurological: She is alert and oriented to person, place, and time.  Skin: Skin is warm, dry and intact. No rash noted.  Psychiatric: She has a normal mood and affect. Her speech is normal and behavior is normal. Judgment and  thought content normal. Cognition and memory are normal.  Nursing note and vitals reviewed.   Results for orders placed or performed in visit on 06/24/15  Comprehensive metabolic panel  Result Value Ref Range   Glucose 101 (H) 65 - 99 mg/dL   BUN 17 8 - 27 mg/dL   Creatinine, Ser 1.21 (H) 0.57 - 1.00 mg/dL   GFR calc non Af Amer 45 (L) >59 mL/min/1.73   GFR calc Af Amer 52 (L) >59 mL/min/1.73   BUN/Creatinine Ratio 14 11 - 26   Sodium 137 134 - 144 mmol/L   Potassium 4.7 3.5 - 5.2 mmol/L   Chloride 100 96 - 106 mmol/L   CO2 22 18 - 29 mmol/L   Calcium 9.4 8.7 - 10.3 mg/dL   Total Protein 6.4 6.0 - 8.5 g/dL   Albumin 4.1 3.5 - 4.8 g/dL    Globulin, Total 2.3 1.5 - 4.5 g/dL   Albumin/Globulin Ratio 1.8 1.1 - 2.5   Bilirubin Total 0.5 0.0 - 1.2 mg/dL   Alkaline Phosphatase 64 39 - 117 IU/L   AST 8 0 - 40 IU/L   ALT 6 0 - 32 IU/L  Lipid Panel Piccolo, Waived  Result Value Ref Range   Cholesterol Piccolo, Waived 128 <200 mg/dL   HDL Chol Piccolo, Waived 71 >59 mg/dL   Triglycerides Piccolo,Waived 84 <150 mg/dL   Chol/HDL Ratio Piccolo,Waive 1.8 mg/dL   LDL Chol Calc Piccolo Waived 41 <100 mg/dL   VLDL Chol Calc Piccolo,Waive 17 <30 mg/dL      Assessment & Plan:   Problem List Items Addressed This Visit      Unprioritized   Benign hypertension with chronic kidney disease, stage III    Stable, continue present medications.        Relevant Medications   lisinopril (PRINIVIL,ZESTRIL) 40 MG tablet   Other Relevant Orders   CBC with Differential/Platelet   Comprehensive metabolic panel   Chronic atrial fibrillation ([place])    Per Dr. Humphrey Rolls      Relevant Medications   lisinopril (PRINIVIL,ZESTRIL) 40 MG tablet   Hyperlipidemia    Check lipid panel      Relevant Medications   lisinopril (PRINIVIL,ZESTRIL) 40 MG tablet   Other Relevant Orders   Lipid Panel w/o Chol/HDL Ratio   Hypothyroidism    Check TSH      Relevant Orders   TSH   Lump or mass in breast    Evaluated by surgery. Recent mammogram negative      Osteopenia    Other Visit Diagnoses    Health care maintenance    -  Primary   Relevant Orders   Hepatitis C antibody     Order bone density   Follow up plan: Return in about 6 months (around 07/01/2016).

## 2016-01-02 NOTE — Assessment & Plan Note (Signed)
Per Dr. Khan 

## 2016-01-03 ENCOUNTER — Encounter: Payer: Self-pay | Admitting: Unknown Physician Specialty

## 2016-01-03 LAB — COMPREHENSIVE METABOLIC PANEL
ALT: 10 IU/L (ref 0–32)
AST: 9 IU/L (ref 0–40)
Albumin/Globulin Ratio: 1.6 (ref 1.2–2.2)
Albumin: 4.1 g/dL (ref 3.5–4.8)
Alkaline Phosphatase: 72 IU/L (ref 39–117)
BUN/Creatinine Ratio: 13 (ref 12–28)
BUN: 16 mg/dL (ref 8–27)
Bilirubin Total: 0.4 mg/dL (ref 0.0–1.2)
CO2: 21 mmol/L (ref 18–29)
Calcium: 9.6 mg/dL (ref 8.7–10.3)
Chloride: 97 mmol/L (ref 96–106)
Creatinine, Ser: 1.22 mg/dL — ABNORMAL HIGH (ref 0.57–1.00)
GFR calc Af Amer: 52 mL/min/{1.73_m2} — ABNORMAL LOW (ref >59)
GFR calc non Af Amer: 45 mL/min/{1.73_m2} — ABNORMAL LOW (ref >59)
Globulin, Total: 2.5 g/dL (ref 1.5–4.5)
Glucose: 105 mg/dL — ABNORMAL HIGH (ref 65–99)
Potassium: 4.6 mmol/L (ref 3.5–5.2)
Sodium: 138 mmol/L (ref 134–144)
Total Protein: 6.6 g/dL (ref 6.0–8.5)

## 2016-01-03 LAB — HEPATITIS C ANTIBODY: Hep C Virus Ab: <0.1 s/co ratio (ref 0.0–0.9)

## 2016-01-03 LAB — LIPID PANEL W/O CHOL/HDL RATIO
Cholesterol, Total: 155 mg/dL (ref 100–199)
HDL: 77 mg/dL (ref >39)
LDL Calculated: 55 mg/dL (ref 0–99)
Triglycerides: 117 mg/dL (ref 0–149)
VLDL Cholesterol Cal: 23 mg/dL (ref 5–40)

## 2016-01-03 LAB — CBC WITH DIFFERENTIAL/PLATELET
Basophils Absolute: 0 10*3/uL (ref 0.0–0.2)
Basos: 1 %
EOS (ABSOLUTE): 0.2 10*3/uL (ref 0.0–0.4)
Eos: 3 %
Hematocrit: 41.1 % (ref 34.0–46.6)
Hemoglobin: 13.9 g/dL (ref 11.1–15.9)
Immature Grans (Abs): 0 10*3/uL (ref 0.0–0.1)
Immature Granulocytes: 0 %
Lymphocytes Absolute: 1.7 10*3/uL (ref 0.7–3.1)
Lymphs: 26 %
MCH: 31 pg (ref 26.6–33.0)
MCHC: 33.8 g/dL (ref 31.5–35.7)
MCV: 92 fL (ref 79–97)
Monocytes Absolute: 0.3 10*3/uL (ref 0.1–0.9)
Monocytes: 5 %
Neutrophils Absolute: 4.3 10*3/uL (ref 1.4–7.0)
Neutrophils: 65 %
Platelets: 203 10*3/uL (ref 150–379)
RBC: 4.49 x10E6/uL (ref 3.77–5.28)
RDW: 14.3 % (ref 12.3–15.4)
WBC: 6.6 10*3/uL (ref 3.4–10.8)

## 2016-01-03 LAB — TSH: TSH: 1.91 u[IU]/mL (ref 0.450–4.500)

## 2016-01-18 ENCOUNTER — Ambulatory Visit
Admission: RE | Admit: 2016-01-18 | Discharge: 2016-01-18 | Disposition: A | Discharge status: Home / Self Care | Payer: Medicare Other | Source: Ambulatory Visit | Attending: Unknown Physician Specialty | Admitting: Unknown Physician Specialty

## 2016-01-18 ENCOUNTER — Encounter: Payer: Self-pay | Admitting: Unknown Physician Specialty

## 2016-01-18 DIAGNOSIS — Z13820 Encounter for screening for osteoporosis: Secondary | ICD-10-CM | POA: Diagnosis not present

## 2016-01-18 DIAGNOSIS — M85852 Other specified disorders of bone density and structure, left thigh: Secondary | ICD-10-CM | POA: Insufficient documentation

## 2016-01-18 DIAGNOSIS — Z0000 Encounter for general adult medical examination without abnormal findings: Secondary | ICD-10-CM | POA: Insufficient documentation

## 2016-01-18 DIAGNOSIS — M85862 Other specified disorders of bone density and structure, left lower leg: Secondary | ICD-10-CM | POA: Diagnosis not present

## 2016-02-20 DIAGNOSIS — I4891 Unspecified atrial fibrillation: Secondary | ICD-10-CM | POA: Diagnosis not present

## 2016-02-20 DIAGNOSIS — I340 Nonrheumatic mitral (valve) insufficiency: Secondary | ICD-10-CM | POA: Diagnosis not present

## 2016-02-20 DIAGNOSIS — I2510 Atherosclerotic heart disease of native coronary artery without angina pectoris: Secondary | ICD-10-CM | POA: Diagnosis not present

## 2016-03-22 DIAGNOSIS — R0602 Shortness of breath: Secondary | ICD-10-CM | POA: Diagnosis not present

## 2016-03-22 DIAGNOSIS — I2510 Atherosclerotic heart disease of native coronary artery without angina pectoris: Secondary | ICD-10-CM | POA: Diagnosis not present

## 2016-03-22 DIAGNOSIS — I340 Nonrheumatic mitral (valve) insufficiency: Secondary | ICD-10-CM | POA: Diagnosis not present

## 2016-03-22 DIAGNOSIS — I4891 Unspecified atrial fibrillation: Secondary | ICD-10-CM | POA: Diagnosis not present

## 2016-03-22 DIAGNOSIS — I10 Essential (primary) hypertension: Secondary | ICD-10-CM | POA: Diagnosis not present

## 2016-04-18 DIAGNOSIS — R600 Localized edema: Secondary | ICD-10-CM | POA: Diagnosis not present

## 2016-04-18 DIAGNOSIS — N179 Acute kidney failure, unspecified: Secondary | ICD-10-CM | POA: Diagnosis not present

## 2016-04-18 DIAGNOSIS — N183 Chronic kidney disease, stage 3 (moderate): Secondary | ICD-10-CM | POA: Diagnosis not present

## 2016-04-18 DIAGNOSIS — I10 Essential (primary) hypertension: Secondary | ICD-10-CM | POA: Diagnosis not present

## 2016-06-22 ENCOUNTER — Other Ambulatory Visit: Payer: Self-pay

## 2016-06-22 MED ORDER — LEVOTHYROXINE SODIUM 75 MCG PO TABS: 75.0000 ug | ORAL_TABLET | Freq: Every day | ORAL | 5 refills | Status: DC

## 2016-07-03 ENCOUNTER — Ambulatory Visit (INDEPENDENT_AMBULATORY_CARE_PROVIDER_SITE_OTHER): Payer: Medicare Other | Admitting: Unknown Physician Specialty

## 2016-07-03 ENCOUNTER — Encounter: Payer: Self-pay | Admitting: Unknown Physician Specialty

## 2016-07-03 DIAGNOSIS — N183 Chronic kidney disease, stage 3 (moderate): Secondary | ICD-10-CM | POA: Diagnosis not present

## 2016-07-03 DIAGNOSIS — I129 Hypertensive chronic kidney disease with stage 1 through stage 4 chronic kidney disease, or unspecified chronic kidney disease: Secondary | ICD-10-CM | POA: Diagnosis not present

## 2016-07-03 DIAGNOSIS — E7800 Pure hypercholesterolemia, unspecified: Secondary | ICD-10-CM

## 2016-07-03 DIAGNOSIS — M109 Gout, unspecified: Secondary | ICD-10-CM

## 2016-07-03 MED ORDER — RALOXIFENE HCL 60 MG PO TABS: 60.0000 mg | ORAL_TABLET | Freq: Every day | ORAL | 5 refills | Status: DC

## 2016-07-03 MED ORDER — LISINOPRIL 40 MG PO TABS: 40.0000 mg | ORAL_TABLET | Freq: Every day | ORAL | 6 refills | Status: DC

## 2016-07-03 MED ORDER — ALLOPURINOL 100 MG PO TABS: 100.0000 mg | ORAL_TABLET | Freq: Two times a day (BID) | ORAL | 5 refills | Status: DC

## 2016-07-03 MED ORDER — AMLODIPINE BESYLATE 2.5 MG PO TABS: 2.5000 mg | ORAL_TABLET | Freq: Every day | ORAL | 5 refills | Status: DC

## 2016-07-03 NOTE — Assessment & Plan Note (Signed)
Stable, continue present medications.  Check CMP today 

## 2016-07-03 NOTE — Progress Notes (Signed)
BP 123/77 (BP Location: Left Arm, Patient Position: Sitting, Cuff Size: Large)   Pulse 84   Temp 97.7 F (36.5 C)   Ht 5' 0.7" (1.542 m) Comment: pt had shoes on  Wt 184 lb (83.5 kg) Comment: pt had shoes on  LMP  (LMP Unknown)   SpO2 98%   BMI 35.11 kg/m    Subjective:    Patient ID: Amy Myers, female    DOB: 25-Jan-1946, 71 y.o.   MRN: [id number]  HPI: Amy Myers is a 71 y.o. female  Chief Complaint  Patient presents with  . Hyperlipidemia  . Hypertension  . Hypothyroidism   Hypertension Using medications without difficulty Average home BPs  SBP 120-130s                No swelling, chest pain, or SOB  Hyperlipidemia Using medications without problems: No Muscle aches  Diet compliance: no changes Exercise:  No changes Pt with oow GFR last visit.  Needs to be checked.    Hypothyroid No complaints of fatigue, weight loss or weight gain  Chronic a fib Taking Elquis 5 mg daily without problems.  Seeing Dr. Humphrey Rolls next month  Gout Has occasional pain but no flares.  On Allopurinol daily.    Relevant past medical, surgical, family and social history reviewed and updated as indicated. Interim medical history since our last visit reviewed. Allergies and medications reviewed and updated.  Review of Systems  Per HPI unless specifically indicated above     Objective:    BP 123/77 (BP Location: Left Arm, Patient Position: Sitting, Cuff Size: Large)   Pulse 84   Temp 97.7 F (36.5 C)   Ht 5' 0.7" (1.542 m) Comment: pt had shoes on  Wt 184 lb (83.5 kg) Comment: pt had shoes on  LMP  (LMP Unknown)   SpO2 98%   BMI 35.11 kg/m   Wt Readings from Last 3 Encounters:  07/03/16 184 lb (83.5 kg)  01/02/16 180 lb 3.2 oz (81.7 kg)  06/24/15 184 lb 3.2 oz (83.6 kg)    Physical Exam  Constitutional: She is oriented to person, place, and time. She appears well-developed and well-nourished. No distress.  HENT:  Head: Normocephalic and atraumatic.  Eyes:  Conjunctivae and lids are normal. Right eye exhibits no discharge. Left eye exhibits no discharge. No scleral icterus.  Neck: Normal range of motion. Neck supple. No JVD present. Carotid bruit is not present.  Cardiovascular: An irregularly irregular rhythm present.  Pulmonary/Chest: Effort normal and breath sounds normal.  Abdominal: Normal appearance. There is no splenomegaly or hepatomegaly.  Musculoskeletal: Normal range of motion.  Neurological: She is alert and oriented to person, place, and time.  Skin: Skin is warm, dry and intact. No rash noted. No pallor.  Psychiatric: She has a normal mood and affect. Her behavior is normal. Judgment and thought content normal.    Results for orders placed or performed in visit on 01/02/16  Hepatitis C antibody  Result Value Ref Range   Hep C Virus Ab <0.1 0.0 - 0.9 s/co ratio  CBC with Differential/Platelet  Result Value Ref Range   WBC 6.6 3.4 - 10.8 x10E3/uL   RBC 4.49 3.77 - 5.28 x10E6/uL   Hemoglobin 13.9 11.1 - 15.9 g/dL   Hematocrit 41.1 34.0 - 46.6 %   MCV 92 79 - 97 fL   MCH 31.0 26.6 - 33.0 pg   MCHC 33.8 31.5 - 35.7 g/dL   RDW 14.3 12.3 -  15.4 %   Platelets 203 150 - 379 x10E3/uL   Neutrophils 65 %   Lymphs 26 %   Monocytes 5 %   Eos 3 %   Basos 1 %   Neutrophils Absolute 4.3 1.4 - 7.0 x10E3/uL   Lymphocytes Absolute 1.7 0.7 - 3.1 x10E3/uL   Monocytes Absolute 0.3 0.1 - 0.9 x10E3/uL   EOS (ABSOLUTE) 0.2 0.0 - 0.4 x10E3/uL   Basophils Absolute 0.0 0.0 - 0.2 x10E3/uL   Immature Granulocytes 0 %   Immature Grans (Abs) 0.0 0.0 - 0.1 x10E3/uL  Comprehensive metabolic panel  Result Value Ref Range   Glucose 105 (H) 65 - 99 mg/dL   BUN 16 8 - 27 mg/dL   Creatinine, Ser 1.22 (H) 0.57 - 1.00 mg/dL   GFR calc non Af Amer 45 (L) >59 mL/min/1.73   GFR calc Af Amer 52 (L) >59 mL/min/1.73   BUN/Creatinine Ratio 13 12 - 28   Sodium 138 134 - 144 mmol/L   Potassium 4.6 3.5 - 5.2 mmol/L   Chloride 97 96 - 106 mmol/L   CO2 21 18 -  29 mmol/L   Calcium 9.6 8.7 - 10.3 mg/dL   Total Protein 6.6 6.0 - 8.5 g/dL   Albumin 4.1 3.5 - 4.8 g/dL   Globulin, Total 2.5 1.5 - 4.5 g/dL   Albumin/Globulin Ratio 1.6 1.2 - 2.2   Bilirubin Total 0.4 0.0 - 1.2 mg/dL   Alkaline Phosphatase 72 39 - 117 IU/L   AST 9 0 - 40 IU/L   ALT 10 0 - 32 IU/L  Lipid Panel w/o Chol/HDL Ratio  Result Value Ref Range   Cholesterol, Total 155 100 - 199 mg/dL   Triglycerides 117 0 - 149 mg/dL   HDL 77 >39 mg/dL   VLDL Cholesterol Cal 23 5 - 40 mg/dL   LDL Calculated 55 0 - 99 mg/dL  TSH  Result Value Ref Range   TSH 1.910 0.450 - 4.500 uIU/mL      Assessment & Plan:   Problem List Items Addressed This Visit      Unprioritized   Benign hypertension with chronic kidney disease, stage III    Stable, continue present medications.  Check CMP today      Relevant Medications   lisinopril (PRINIVIL,ZESTRIL) 40 MG tablet   amLODipine (NORVASC) 2.5 MG tablet   Other Relevant Orders   Comprehensive metabolic panel   Gout    Renal doses of Allopurinol  Doing well and continue present      Relevant Orders   Comprehensive metabolic panel   Uric acid   Hyperlipidemia    Stable, continue present medications.        Relevant Medications   lisinopril (PRINIVIL,ZESTRIL) 40 MG tablet   amLODipine (NORVASC) 2.5 MG tablet       Follow up plan: Return in about 6 months (around 01/03/2017) for for PE.

## 2016-07-03 NOTE — Assessment & Plan Note (Signed)
Renal doses of Allopurinol  Doing well and continue present

## 2016-07-03 NOTE — Assessment & Plan Note (Signed)
Stable, continue present medications.   

## 2016-07-04 ENCOUNTER — Encounter: Payer: Self-pay | Admitting: Unknown Physician Specialty

## 2016-07-04 LAB — COMPREHENSIVE METABOLIC PANEL
ALT: 9 IU/L (ref 0–32)
AST: 13 IU/L (ref 0–40)
Albumin/Globulin Ratio: 1.8 (ref 1.2–2.2)
Albumin: 4.1 g/dL (ref 3.5–4.8)
Alkaline Phosphatase: 68 IU/L (ref 39–117)
BUN/Creatinine Ratio: 14 (ref 12–28)
BUN: 16 mg/dL (ref 8–27)
Bilirubin Total: 0.3 mg/dL (ref 0.0–1.2)
CO2: 28 mmol/L (ref 18–29)
Calcium: 9.6 mg/dL (ref 8.7–10.3)
Chloride: 102 mmol/L (ref 96–106)
Creatinine, Ser: 1.17 mg/dL — ABNORMAL HIGH (ref 0.57–1.00)
GFR calc Af Amer: 54 mL/min/{1.73_m2} — ABNORMAL LOW (ref >59)
GFR calc non Af Amer: 47 mL/min/{1.73_m2} — ABNORMAL LOW (ref >59)
Globulin, Total: 2.3 g/dL (ref 1.5–4.5)
Glucose: 101 mg/dL — ABNORMAL HIGH (ref 65–99)
Potassium: 4.6 mmol/L (ref 3.5–5.2)
Sodium: 142 mmol/L (ref 134–144)
Total Protein: 6.4 g/dL (ref 6.0–8.5)

## 2016-07-04 LAB — URIC ACID: Uric Acid: 4.3 mg/dL (ref 2.5–7.1)

## 2016-07-20 DIAGNOSIS — R0602 Shortness of breath: Secondary | ICD-10-CM | POA: Diagnosis not present

## 2016-07-20 DIAGNOSIS — I10 Essential (primary) hypertension: Secondary | ICD-10-CM | POA: Diagnosis not present

## 2016-07-20 DIAGNOSIS — I2510 Atherosclerotic heart disease of native coronary artery without angina pectoris: Secondary | ICD-10-CM | POA: Diagnosis not present

## 2016-07-20 DIAGNOSIS — I4891 Unspecified atrial fibrillation: Secondary | ICD-10-CM | POA: Diagnosis not present

## 2016-08-03 DIAGNOSIS — I4891 Unspecified atrial fibrillation: Secondary | ICD-10-CM | POA: Diagnosis not present

## 2016-08-03 DIAGNOSIS — I10 Essential (primary) hypertension: Secondary | ICD-10-CM | POA: Diagnosis not present

## 2016-08-03 DIAGNOSIS — I2510 Atherosclerotic heart disease of native coronary artery without angina pectoris: Secondary | ICD-10-CM | POA: Diagnosis not present

## 2016-08-09 DIAGNOSIS — R0602 Shortness of breath: Secondary | ICD-10-CM | POA: Diagnosis not present

## 2016-08-09 DIAGNOSIS — I10 Essential (primary) hypertension: Secondary | ICD-10-CM | POA: Diagnosis not present

## 2016-08-09 DIAGNOSIS — I4891 Unspecified atrial fibrillation: Secondary | ICD-10-CM | POA: Diagnosis not present

## 2016-08-09 DIAGNOSIS — G4730 Sleep apnea, unspecified: Secondary | ICD-10-CM | POA: Diagnosis not present

## 2016-08-20 DIAGNOSIS — R0602 Shortness of breath: Secondary | ICD-10-CM | POA: Diagnosis not present

## 2016-08-20 DIAGNOSIS — I10 Essential (primary) hypertension: Secondary | ICD-10-CM | POA: Diagnosis not present

## 2016-08-20 DIAGNOSIS — I2510 Atherosclerotic heart disease of native coronary artery without angina pectoris: Secondary | ICD-10-CM | POA: Diagnosis not present

## 2016-08-20 DIAGNOSIS — G4730 Sleep apnea, unspecified: Secondary | ICD-10-CM | POA: Diagnosis not present

## 2016-08-20 DIAGNOSIS — I4891 Unspecified atrial fibrillation: Secondary | ICD-10-CM | POA: Diagnosis not present

## 2016-10-04 DIAGNOSIS — I2510 Atherosclerotic heart disease of native coronary artery without angina pectoris: Secondary | ICD-10-CM | POA: Diagnosis not present

## 2016-10-04 DIAGNOSIS — I25119 Atherosclerotic heart disease of native coronary artery with unspecified angina pectoris: Secondary | ICD-10-CM | POA: Diagnosis not present

## 2016-10-04 DIAGNOSIS — I10 Essential (primary) hypertension: Secondary | ICD-10-CM | POA: Diagnosis not present

## 2016-10-04 DIAGNOSIS — I4891 Unspecified atrial fibrillation: Secondary | ICD-10-CM | POA: Diagnosis not present

## 2016-10-10 ENCOUNTER — Telehealth: Payer: Self-pay | Admitting: Unknown Physician Specialty

## 2016-10-10 NOTE — Telephone Encounter (Signed)
Patient called to let Amy Myers know that her cardiologist had done lab work on her and that they found her thyroid was messed up. She wanted me to ask before scheduling an appt what Amy Myers wanted her to do.  Please advise.  Thanks  [phone]

## 2016-10-10 NOTE — Telephone Encounter (Signed)
Called and spoke to patient. She states that she sees Dr. Humphrey Rolls at [facility]. Will call them and see if they can fax [place] the patient lab results.

## 2016-10-10 NOTE — Telephone Encounter (Signed)
Called and left a VM at the nurses station at [facility] requesting a copy of the patient lab results.

## 2016-10-10 NOTE — Telephone Encounter (Signed)
Routing to provider for advice.

## 2016-10-10 NOTE — Telephone Encounter (Signed)
It was fine last time I checked.  I can't find any results.  Probably thyroid medicine needs adjusting but I need the results

## 2016-10-15 NOTE — Telephone Encounter (Signed)
Fax number [phone].

## 2016-10-15 NOTE — Telephone Encounter (Signed)
Results received from [place]. Will give to [facility] for review tomorrow when she returns.

## 2016-10-15 NOTE — Telephone Encounter (Signed)
[place] and spoke to medical records department. I asked if we could be sent a copy of the patient last lab results. They asked for me to send them a fax on a cover sheet with the information I was needing. Will send them a fax now.

## 2016-10-16 MED ORDER — LEVOTHYROXINE SODIUM 100 MCG PO TABS: 100.0000 ug | ORAL_TABLET | Freq: Every day | ORAL | 0 refills | Status: DC

## 2016-10-16 NOTE — Addendum Note (Signed)
Addended by: Kathrine Haddock on: 10/16/2016 12:11 PM   Modules accepted: Orders

## 2016-10-16 NOTE — Telephone Encounter (Signed)
Patient notified

## 2016-10-16 NOTE — Telephone Encounter (Signed)
Please let her know that I will increase her Synthroid

## 2016-10-16 NOTE — Telephone Encounter (Signed)
Results given to [facility] for review.

## 2016-10-22 ENCOUNTER — Other Ambulatory Visit: Payer: Self-pay | Admitting: Family Medicine

## 2016-10-22 DIAGNOSIS — Z1231 Encounter for screening mammogram for malignant neoplasm of breast: Secondary | ICD-10-CM

## 2016-11-13 ENCOUNTER — Ambulatory Visit
Admission: RE | Admit: 2016-11-13 | Discharge: 2016-11-13 | Disposition: A | Discharge status: Home / Self Care | Payer: Medicare Other | Source: Ambulatory Visit | Attending: Family Medicine | Admitting: Family Medicine

## 2016-11-13 DIAGNOSIS — Z1231 Encounter for screening mammogram for malignant neoplasm of breast: Secondary | ICD-10-CM | POA: Diagnosis not present

## 2016-12-04 DIAGNOSIS — I4891 Unspecified atrial fibrillation: Secondary | ICD-10-CM | POA: Diagnosis not present

## 2016-12-04 DIAGNOSIS — I2510 Atherosclerotic heart disease of native coronary artery without angina pectoris: Secondary | ICD-10-CM | POA: Diagnosis not present

## 2016-12-04 DIAGNOSIS — I340 Nonrheumatic mitral (valve) insufficiency: Secondary | ICD-10-CM | POA: Diagnosis not present

## 2016-12-04 DIAGNOSIS — I10 Essential (primary) hypertension: Secondary | ICD-10-CM | POA: Diagnosis not present

## 2016-12-06 ENCOUNTER — Ambulatory Visit (INDEPENDENT_AMBULATORY_CARE_PROVIDER_SITE_OTHER): Payer: Medicare Other

## 2016-12-06 VITALS — BP 127/78 | HR 64 | Temp 98.0°F | Resp 15 | Ht 60.0 in | Wt 186.8 lb

## 2016-12-06 DIAGNOSIS — Z0000 Encounter for general adult medical examination without abnormal findings: Secondary | ICD-10-CM

## 2016-12-06 NOTE — Patient Instructions (Addendum)
Amy Myers , Thank you for taking time to come for your Medicare Wellness Visit. I appreciate your ongoing commitment to your health goals. Please review the following plan we discussed and let me know if I can assist you in the future.   Screening recommendations/referrals: Colonoscopy: completed 03/08/2014  Mammogram: completed 11/13/2016 Bone Density: completed 02/07/2016 Recommended yearly ophthalmology/optometry visit for glaucoma screening and checkup Recommended yearly dental visit for hygiene and checkup  Vaccinations: Influenza vaccine: up to date, due 12/2016 Pneumococcal vaccine: up to date Tdap vaccine: up to date Shingles vaccine: up to date  Advanced directives: Advance directive discussed with you today. I have provided a copy for you to complete at home and have notarized. Once this is complete please bring a copy in to our office so we can scan it into your chart.  Conditions/risks identified: Recommend drinking at least 5-6 glasses of water a day   Next appointment: Follow up on 01/04/2017 at 9:00am with Regino Schultze. Follow up in one year for your annual wellness exam.    Preventive Care 65 Years and Older, Female Preventive care refers to lifestyle choices and visits with your health care provider that can promote health and wellness. What does preventive care include?  A yearly physical exam. This is also called an annual well check.  Dental exams once or twice a year.  Routine eye exams. Ask your health care provider how often you should have your eyes checked.  Personal lifestyle choices, including:  Daily care of your teeth and gums.  Regular physical activity.  Eating a healthy diet.  Avoiding tobacco and drug use.  Limiting alcohol use.  Practicing safe sex.  Taking low-dose aspirin every day.  Taking vitamin and mineral supplements as recommended by your health care provider. What happens during an annual well check? The services and  screenings done by your health care provider during your annual well check will depend on your age, overall health, lifestyle risk factors, and family history of disease. Counseling  Your health care provider may ask you questions about your:  Alcohol use.  Tobacco use.  Drug use.  Emotional well-being.  Home and relationship well-being.  Sexual activity.  Eating habits.  History of falls.  Memory and ability to understand (cognition).  Work and work Statistician.  Reproductive health. Screening  You may have the following tests or measurements:  Height, weight, and BMI.  Blood pressure.  Lipid and cholesterol levels. These may be checked every 5 years, or more frequently if you are over 86 years old.  Skin check.  Lung cancer screening. You may have this screening every year starting at age 71 if you have a 30-pack-year history of smoking and currently smoke or have quit within the past 15 years.  Fecal occult blood test (FOBT) of the stool. You may have this test every year starting at age 28.  Flexible sigmoidoscopy or colonoscopy. You may have a sigmoidoscopy every 5 years or a colonoscopy every 10 years starting at age 41.  Hepatitis C blood test.  Hepatitis B blood test.  Sexually transmitted disease (STD) testing.  Diabetes screening. This is done by checking your blood sugar (glucose) after you have not eaten for a while (fasting). You may have this done every 1-3 years.  Bone density scan. This is done to screen for osteoporosis. You may have this done starting at age 15.  Mammogram. This may be done every 1-2 years. Talk to your health care provider about how often  you should have regular mammograms. Talk with your health care provider about your test results, treatment options, and if necessary, the need for more tests. Vaccines  Your health care provider may recommend certain vaccines, such as:  Influenza vaccine. This is recommended every  year.  Tetanus, diphtheria, and acellular pertussis (Tdap, Td) vaccine. You may need a Td booster every 10 years.  Zoster vaccine. You may need this after age 84.  Pneumococcal 13-valent conjugate (PCV13) vaccine. One dose is recommended after age 87.  Pneumococcal polysaccharide (PPSV23) vaccine. One dose is recommended after age 56. Talk to your health care provider about which screenings and vaccines you need and how often you need them. This information is not intended to replace advice given to you by your health care provider. Make sure you discuss any questions you have with your health care provider. Document Released: 04/29/2015 Document Revised: 12/21/2015 Document Reviewed: 02/01/2015 Elsevier Interactive Patient Education  2017 [place] Prevention in the Home Falls can cause injuries. They can happen to people of all ages. There are many things you can do to make your home safe and to help prevent falls. What can I do on the outside of my home?  Regularly fix the edges of walkways and driveways and fix any cracks.  Remove anything that might make you trip as you walk through a door, such as a raised step or threshold.  Trim any bushes or trees on the path to your home.  Use bright outdoor lighting.  Clear any walking paths of anything that might make someone trip, such as rocks or tools.  Regularly check to see if handrails are loose or broken. Make sure that both sides of any steps have handrails.  Any raised decks and porches should have guardrails on the edges.  Have any leaves, snow, or ice cleared regularly.  Use sand or salt on walking paths during winter.  Clean up any spills in your garage right away. This includes oil or grease spills. What can I do in the bathroom?  Use night lights.  Install grab bars by the toilet and in the tub and shower. Do not use towel bars as grab bars.  Use non-skid mats or decals in the tub or shower.  If you  need to sit down in the shower, use a plastic, non-slip stool.  Keep the floor dry. Clean up any water that spills on the floor as soon as it happens.  Remove soap buildup in the tub or shower regularly.  Attach bath mats securely with double-sided non-slip rug tape.  Do not have throw rugs and other things on the floor that can make you trip. What can I do in the bedroom?  Use night lights.  Make sure that you have a light by your bed that is easy to reach.  Do not use any sheets or blankets that are too big for your bed. They should not hang down onto the floor.  Have a firm chair that has side arms. You can use this for support while you get dressed.  Do not have throw rugs and other things on the floor that can make you trip. What can I do in the kitchen?  Clean up any spills right away.  Avoid walking on wet floors.  Keep items that you use a lot in easy-to-reach places.  If you need to reach something above you, use a strong step stool that has a grab bar.  Keep electrical cords  out of the way.  Do not use floor polish or wax that makes floors slippery. If you must use wax, use non-skid floor wax.  Do not have throw rugs and other things on the floor that can make you trip. What can I do with my stairs?  Do not leave any items on the stairs.  Make sure that there are handrails on both sides of the stairs and use them. Fix handrails that are broken or loose. Make sure that handrails are as long as the stairways.  Check any carpeting to make sure that it is firmly attached to the stairs. Fix any carpet that is loose or worn.  Avoid having throw rugs at the top or bottom of the stairs. If you do have throw rugs, attach them to the floor with carpet tape.  Make sure that you have a light switch at the top of the stairs and the bottom of the stairs. If you do not have them, ask someone to add them for you. What else can I do to help prevent falls?  Wear shoes  that:  Do not have high heels.  Have rubber bottoms.  Are comfortable and fit you well.  Are closed at the toe. Do not wear sandals.  If you use a stepladder:  Make sure that it is fully opened. Do not climb a closed stepladder.  Make sure that both sides of the stepladder are locked into place.  Ask someone to hold it for you, if possible.  Clearly mark and make sure that you can see:  Any grab bars or handrails.  First and last steps.  Where the edge of each step is.  Use tools that help you move around (mobility aids) if they are needed. These include:  Canes.  Walkers.  Scooters.  Crutches.  Turn on the lights when you go into a dark area. Replace any light bulbs as soon as they burn out.  Set up your furniture so you have a clear path. Avoid moving your furniture around.  If any of your floors are uneven, fix them.  If there are any pets around you, be aware of where they are.  Review your medicines with your doctor. Some medicines can make you feel dizzy. This can increase your chance of falling. Ask your doctor what other things that you can do to help prevent falls. This information is not intended to replace advice given to you by your health care provider. Make sure you discuss any questions you have with your health care provider. Document Released: 01/27/2009 Document Revised: 09/08/2015 Document Reviewed: 05/07/2014 Elsevier Interactive Patient Education  2017 [employer].
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
## 2017-01-04 ENCOUNTER — Encounter: Payer: Self-pay | Admitting: Unknown Physician Specialty

## 2017-01-04 ENCOUNTER — Ambulatory Visit (INDEPENDENT_AMBULATORY_CARE_PROVIDER_SITE_OTHER): Payer: Medicare Other | Admitting: Unknown Physician Specialty

## 2017-01-04 VITALS — BP 126/84 | HR 101 | Temp 98.2°F | Wt 184.2 lb

## 2017-01-04 DIAGNOSIS — N183 Chronic kidney disease, stage 3 unspecified: Secondary | ICD-10-CM

## 2017-01-04 DIAGNOSIS — Z23 Encounter for immunization: Secondary | ICD-10-CM | POA: Diagnosis not present

## 2017-01-04 DIAGNOSIS — N63 Unspecified lump in unspecified breast: Secondary | ICD-10-CM | POA: Diagnosis not present

## 2017-01-04 DIAGNOSIS — E7800 Pure hypercholesterolemia, unspecified: Secondary | ICD-10-CM | POA: Diagnosis not present

## 2017-01-04 DIAGNOSIS — Z0000 Encounter for general adult medical examination without abnormal findings: Secondary | ICD-10-CM

## 2017-01-04 DIAGNOSIS — I129 Hypertensive chronic kidney disease with stage 1 through stage 4 chronic kidney disease, or unspecified chronic kidney disease: Secondary | ICD-10-CM

## 2017-01-04 DIAGNOSIS — E039 Hypothyroidism, unspecified: Secondary | ICD-10-CM | POA: Diagnosis not present

## 2017-01-04 DIAGNOSIS — Z7189 Other specified counseling: Secondary | ICD-10-CM

## 2017-01-04 MED ORDER — ALLOPURINOL 100 MG PO TABS: 100.0000 mg | ORAL_TABLET | Freq: Two times a day (BID) | ORAL | 5 refills | Status: DC

## 2017-01-04 MED ORDER — LISINOPRIL 40 MG PO TABS: 40.0000 mg | ORAL_TABLET | Freq: Every day | ORAL | 6 refills | Status: DC

## 2017-01-04 MED ORDER — AMLODIPINE BESYLATE 2.5 MG PO TABS: 2.5000 mg | ORAL_TABLET | Freq: Every day | ORAL | 5 refills | Status: DC

## 2017-01-04 MED ORDER — LEVOTHYROXINE SODIUM 100 MCG PO TABS: 100.0000 ug | ORAL_TABLET | Freq: Every day | ORAL | 0 refills | Status: DC

## 2017-01-04 NOTE — Assessment & Plan Note (Signed)
Check labs today.
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
## 2017-01-04 NOTE — Assessment & Plan Note (Signed)
Pt brought in a living will and HCPOA paperwork.  Her HCPOA is Amy Myers, her nephew and living will completed.

## 2017-01-04 NOTE — Assessment & Plan Note (Signed)
Stable, continue present medications.   

## 2017-01-04 NOTE — Assessment & Plan Note (Signed)
Complaints of weight gain.  Check labs

## 2017-01-04 NOTE — Patient Instructions (Addendum)
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
## 2017-01-05 LAB — CBC WITH DIFFERENTIAL/PLATELET
Basophils Absolute: 0 10*3/uL (ref 0.0–0.2)
Basos: 1 %
EOS (ABSOLUTE): 0.2 10*3/uL (ref 0.0–0.4)
Eos: 3 %
Hematocrit: 39.9 % (ref 34.0–46.6)
Hemoglobin: 13.2 g/dL (ref 11.1–15.9)
Immature Grans (Abs): 0 10*3/uL (ref 0.0–0.1)
Immature Granulocytes: 0 %
Lymphocytes Absolute: 1 10*3/uL (ref 0.7–3.1)
Lymphs: 20 %
MCH: 31 pg (ref 26.6–33.0)
MCHC: 33.1 g/dL (ref 31.5–35.7)
MCV: 94 fL (ref 79–97)
Monocytes Absolute: 0.2 10*3/uL (ref 0.1–0.9)
Monocytes: 5 %
Neutrophils Absolute: 3.7 10*3/uL (ref 1.4–7.0)
Neutrophils: 71 %
Platelets: 161 10*3/uL (ref 150–379)
RBC: 4.26 x10E6/uL (ref 3.77–5.28)
RDW: 15.2 % (ref 12.3–15.4)
WBC: 5.1 10*3/uL (ref 3.4–10.8)

## 2017-01-05 LAB — THYROID PANEL WITH TSH
Free Thyroxine Index: 4.1 (ref 1.2–4.9)
T3 Uptake Ratio: 31 % (ref 24–39)
T4, Total: 13.2 ug/dL — ABNORMAL HIGH (ref 4.5–12.0)
TSH: 0.343 u[IU]/mL — ABNORMAL LOW (ref 0.450–4.500)

## 2017-01-05 LAB — COMPREHENSIVE METABOLIC PANEL
ALT: 7 IU/L (ref 0–32)
AST: 13 IU/L (ref 0–40)
Albumin/Globulin Ratio: 2.1 (ref 1.2–2.2)
Albumin: 4.6 g/dL (ref 3.5–4.8)
Alkaline Phosphatase: 66 IU/L (ref 39–117)
BUN/Creatinine Ratio: 10 — ABNORMAL LOW (ref 12–28)
BUN: 15 mg/dL (ref 8–27)
Bilirubin Total: 0.4 mg/dL (ref 0.0–1.2)
CO2: 25 mmol/L (ref 20–29)
Calcium: 9.7 mg/dL (ref 8.7–10.3)
Chloride: 100 mmol/L (ref 96–106)
Creatinine, Ser: 1.51 mg/dL — ABNORMAL HIGH (ref 0.57–1.00)
GFR calc Af Amer: 40 mL/min/{1.73_m2} — ABNORMAL LOW (ref >59)
GFR calc non Af Amer: 34 mL/min/{1.73_m2} — ABNORMAL LOW (ref >59)
Globulin, Total: 2.2 g/dL (ref 1.5–4.5)
Glucose: 97 mg/dL (ref 65–99)
Potassium: 4.1 mmol/L (ref 3.5–5.2)
Sodium: 140 mmol/L (ref 134–144)
Total Protein: 6.8 g/dL (ref 6.0–8.5)

## 2017-01-05 LAB — LIPID PANEL W/O CHOL/HDL RATIO
Cholesterol, Total: 146 mg/dL (ref 100–199)
HDL: 89 mg/dL (ref >39)
LDL Calculated: 44 mg/dL (ref 0–99)
Triglycerides: 65 mg/dL (ref 0–149)
VLDL Cholesterol Cal: 13 mg/dL (ref 5–40)

## 2017-01-08 ENCOUNTER — Telehealth: Payer: Self-pay | Admitting: Unknown Physician Specialty

## 2017-01-08 DIAGNOSIS — N183 Chronic kidney disease, stage 3 unspecified: Secondary | ICD-10-CM

## 2017-01-08 NOTE — Telephone Encounter (Signed)
Discuss with pt low kidney function.  Will refer to nephrology for further evaluation.

## 2017-01-09 ENCOUNTER — Other Ambulatory Visit: Payer: Self-pay | Admitting: Unknown Physician Specialty

## 2017-02-07 ENCOUNTER — Other Ambulatory Visit: Payer: Self-pay | Admitting: Unknown Physician Specialty

## 2017-02-15 DIAGNOSIS — R601 Generalized edema: Secondary | ICD-10-CM | POA: Diagnosis not present

## 2017-02-15 DIAGNOSIS — I10 Essential (primary) hypertension: Secondary | ICD-10-CM | POA: Diagnosis not present

## 2017-02-15 DIAGNOSIS — N183 Chronic kidney disease, stage 3 (moderate): Secondary | ICD-10-CM | POA: Diagnosis not present

## 2017-02-15 DIAGNOSIS — N179 Acute kidney failure, unspecified: Secondary | ICD-10-CM | POA: Diagnosis not present

## 2017-02-20 DIAGNOSIS — N179 Acute kidney failure, unspecified: Secondary | ICD-10-CM | POA: Diagnosis not present

## 2017-02-20 DIAGNOSIS — N183 Chronic kidney disease, stage 3 (moderate): Secondary | ICD-10-CM | POA: Diagnosis not present

## 2017-02-20 DIAGNOSIS — I10 Essential (primary) hypertension: Secondary | ICD-10-CM | POA: Diagnosis not present

## 2017-02-20 DIAGNOSIS — R600 Localized edema: Secondary | ICD-10-CM | POA: Diagnosis not present

## 2017-03-11 DIAGNOSIS — I10 Essential (primary) hypertension: Secondary | ICD-10-CM | POA: Diagnosis not present

## 2017-03-11 DIAGNOSIS — R0602 Shortness of breath: Secondary | ICD-10-CM | POA: Diagnosis not present

## 2017-03-11 DIAGNOSIS — I4891 Unspecified atrial fibrillation: Secondary | ICD-10-CM | POA: Diagnosis not present

## 2017-03-11 DIAGNOSIS — I2510 Atherosclerotic heart disease of native coronary artery without angina pectoris: Secondary | ICD-10-CM | POA: Diagnosis not present

## 2017-03-12 DIAGNOSIS — N179 Acute kidney failure, unspecified: Secondary | ICD-10-CM | POA: Diagnosis not present

## 2017-03-12 DIAGNOSIS — N183 Chronic kidney disease, stage 3 (moderate): Secondary | ICD-10-CM | POA: Diagnosis not present

## 2017-04-08 ENCOUNTER — Other Ambulatory Visit: Payer: Self-pay | Admitting: Unknown Physician Specialty

## 2017-04-08 MED ORDER — RALOXIFENE HCL 60 MG PO TABS: 60.0000 mg | ORAL_TABLET | Freq: Every day | ORAL | 0 refills | Status: DC

## 2017-04-08 NOTE — Telephone Encounter (Signed)
Received a refill request for Reloxifene HCL 60 mg  #30   [facility]

## 2017-04-08 NOTE — Telephone Encounter (Signed)
Routing to provider. Patient last seen 01/04/2017.

## 2017-04-14 ENCOUNTER — Other Ambulatory Visit: Payer: Self-pay | Admitting: Unknown Physician Specialty

## 2017-04-18 DIAGNOSIS — I10 Essential (primary) hypertension: Secondary | ICD-10-CM | POA: Diagnosis not present

## 2017-04-18 DIAGNOSIS — N179 Acute kidney failure, unspecified: Secondary | ICD-10-CM | POA: Diagnosis not present

## 2017-04-18 DIAGNOSIS — N183 Chronic kidney disease, stage 3 (moderate): Secondary | ICD-10-CM | POA: Diagnosis not present

## 2017-04-18 DIAGNOSIS — R600 Localized edema: Secondary | ICD-10-CM | POA: Diagnosis not present

## 2017-04-26 DIAGNOSIS — Z7901 Long term (current) use of anticoagulants: Secondary | ICD-10-CM | POA: Diagnosis not present

## 2017-04-26 DIAGNOSIS — Z86010 Personal history of colonic polyps: Secondary | ICD-10-CM | POA: Diagnosis not present

## 2017-05-30 ENCOUNTER — Other Ambulatory Visit: Payer: Self-pay

## 2017-05-31 MED ORDER — RALOXIFENE HCL 60 MG PO TABS: 60.0000 mg | ORAL_TABLET | Freq: Every day | ORAL | 0 refills | Status: DC

## 2017-05-31 NOTE — Telephone Encounter (Signed)
Error   This encounter was created in error - please disregard. 

## 2017-06-11 ENCOUNTER — Encounter: Payer: Self-pay | Admitting: Unknown Physician Specialty

## 2017-06-11 DIAGNOSIS — G4730 Sleep apnea, unspecified: Secondary | ICD-10-CM | POA: Diagnosis not present

## 2017-06-11 DIAGNOSIS — I2510 Atherosclerotic heart disease of native coronary artery without angina pectoris: Secondary | ICD-10-CM | POA: Diagnosis not present

## 2017-06-11 DIAGNOSIS — I509 Heart failure, unspecified: Secondary | ICD-10-CM | POA: Diagnosis not present

## 2017-06-11 DIAGNOSIS — I4891 Unspecified atrial fibrillation: Secondary | ICD-10-CM | POA: Diagnosis not present

## 2017-06-21 DIAGNOSIS — I4891 Unspecified atrial fibrillation: Secondary | ICD-10-CM | POA: Diagnosis not present

## 2017-06-21 DIAGNOSIS — I10 Essential (primary) hypertension: Secondary | ICD-10-CM | POA: Diagnosis not present

## 2017-06-21 DIAGNOSIS — I2510 Atherosclerotic heart disease of native coronary artery without angina pectoris: Secondary | ICD-10-CM | POA: Diagnosis not present

## 2017-06-21 DIAGNOSIS — R0602 Shortness of breath: Secondary | ICD-10-CM | POA: Diagnosis not present

## 2017-06-21 DIAGNOSIS — G4730 Sleep apnea, unspecified: Secondary | ICD-10-CM | POA: Diagnosis not present

## 2017-07-01 DIAGNOSIS — I10 Essential (primary) hypertension: Secondary | ICD-10-CM | POA: Diagnosis not present

## 2017-07-01 DIAGNOSIS — R0602 Shortness of breath: Secondary | ICD-10-CM | POA: Diagnosis not present

## 2017-07-01 DIAGNOSIS — I4891 Unspecified atrial fibrillation: Secondary | ICD-10-CM | POA: Diagnosis not present

## 2017-07-01 DIAGNOSIS — I2510 Atherosclerotic heart disease of native coronary artery without angina pectoris: Secondary | ICD-10-CM | POA: Diagnosis not present

## 2017-07-02 ENCOUNTER — Encounter: Payer: Self-pay | Admitting: *Deleted

## 2017-07-03 ENCOUNTER — Encounter: Payer: Self-pay | Admitting: *Deleted

## 2017-07-03 ENCOUNTER — Other Ambulatory Visit: Payer: Self-pay

## 2017-07-03 ENCOUNTER — Ambulatory Visit
Admission: RE | Admit: 2017-07-03 | Discharge: 2017-07-03 | Disposition: A | Discharge status: Home / Self Care | Payer: Medicare Other | Source: Ambulatory Visit | Attending: Unknown Physician Specialty | Admitting: Unknown Physician Specialty

## 2017-07-03 ENCOUNTER — Ambulatory Visit: Payer: Medicare Other | Admitting: Anesthesiology

## 2017-07-03 ENCOUNTER — Encounter
Admission: RE | Disposition: A | Discharge status: Home / Self Care | Payer: Self-pay | Source: Ambulatory Visit | Attending: Unknown Physician Specialty

## 2017-07-03 DIAGNOSIS — R42 Dizziness and giddiness: Secondary | ICD-10-CM | POA: Insufficient documentation

## 2017-07-03 DIAGNOSIS — K640 First degree hemorrhoids: Secondary | ICD-10-CM | POA: Diagnosis not present

## 2017-07-03 DIAGNOSIS — Z7901 Long term (current) use of anticoagulants: Secondary | ICD-10-CM | POA: Diagnosis not present

## 2017-07-03 DIAGNOSIS — M109 Gout, unspecified: Secondary | ICD-10-CM | POA: Diagnosis not present

## 2017-07-03 DIAGNOSIS — E039 Hypothyroidism, unspecified: Secondary | ICD-10-CM | POA: Diagnosis not present

## 2017-07-03 DIAGNOSIS — I129 Hypertensive chronic kidney disease with stage 1 through stage 4 chronic kidney disease, or unspecified chronic kidney disease: Secondary | ICD-10-CM | POA: Insufficient documentation

## 2017-07-03 DIAGNOSIS — I2510 Atherosclerotic heart disease of native coronary artery without angina pectoris: Secondary | ICD-10-CM | POA: Diagnosis not present

## 2017-07-03 DIAGNOSIS — Z8262 Family history of osteoporosis: Secondary | ICD-10-CM | POA: Diagnosis not present

## 2017-07-03 DIAGNOSIS — Z79899 Other long term (current) drug therapy: Secondary | ICD-10-CM | POA: Insufficient documentation

## 2017-07-03 DIAGNOSIS — N189 Chronic kidney disease, unspecified: Secondary | ICD-10-CM | POA: Diagnosis not present

## 2017-07-03 DIAGNOSIS — K635 Polyp of colon: Secondary | ICD-10-CM | POA: Diagnosis not present

## 2017-07-03 DIAGNOSIS — E785 Hyperlipidemia, unspecified: Secondary | ICD-10-CM | POA: Diagnosis not present

## 2017-07-03 DIAGNOSIS — Z825 Family history of asthma and other chronic lower respiratory diseases: Secondary | ICD-10-CM | POA: Diagnosis not present

## 2017-07-03 DIAGNOSIS — Z86010 Personal history of colonic polyps: Secondary | ICD-10-CM | POA: Insufficient documentation

## 2017-07-03 DIAGNOSIS — I4891 Unspecified atrial fibrillation: Secondary | ICD-10-CM | POA: Diagnosis not present

## 2017-07-03 DIAGNOSIS — Z8249 Family history of ischemic heart disease and other diseases of the circulatory system: Secondary | ICD-10-CM | POA: Insufficient documentation

## 2017-07-03 DIAGNOSIS — M8580 Other specified disorders of bone density and structure, unspecified site: Secondary | ICD-10-CM | POA: Diagnosis not present

## 2017-07-03 DIAGNOSIS — Z1211 Encounter for screening for malignant neoplasm of colon: Secondary | ICD-10-CM | POA: Diagnosis not present

## 2017-07-03 DIAGNOSIS — R011 Cardiac murmur, unspecified: Secondary | ICD-10-CM | POA: Insufficient documentation

## 2017-07-03 DIAGNOSIS — E669 Obesity, unspecified: Secondary | ICD-10-CM | POA: Insufficient documentation

## 2017-07-03 DIAGNOSIS — D124 Benign neoplasm of descending colon: Secondary | ICD-10-CM | POA: Insufficient documentation

## 2017-07-03 DIAGNOSIS — I340 Nonrheumatic mitral (valve) insufficiency: Secondary | ICD-10-CM | POA: Diagnosis not present

## 2017-07-03 DIAGNOSIS — Z823 Family history of stroke: Secondary | ICD-10-CM | POA: Insufficient documentation

## 2017-07-03 DIAGNOSIS — N183 Chronic kidney disease, stage 3 (moderate): Secondary | ICD-10-CM | POA: Diagnosis not present

## 2017-07-03 DIAGNOSIS — Z6835 Body mass index (BMI) 35.0-35.9, adult: Secondary | ICD-10-CM | POA: Insufficient documentation

## 2017-07-03 HISTORY — PX: COLONOSCOPY WITH PROPOFOL: SHX5780

## 2017-07-03 HISTORY — DX: Unspecified atrial fibrillation: I48.91

## 2017-07-03 HISTORY — DX: Nonrheumatic mitral (valve) insufficiency: I34.0

## 2017-07-03 HISTORY — DX: Atherosclerotic heart disease of native coronary artery without angina pectoris: I25.10

## 2017-07-03 HISTORY — DX: Hypothyroidism, unspecified: E03.9

## 2017-07-03 HISTORY — DX: Dizziness and giddiness: R42

## 2017-07-03 SURGERY — COLONOSCOPY WITH PROPOFOL
Anesthesia: General

## 2017-07-03 MED ORDER — PROPOFOL 10 MG/ML IV BOLUS
INTRAVENOUS | Status: DC | PRN
  Administered 2017-07-03: 80 mg via INTRAVENOUS

## 2017-07-03 MED ORDER — PROPOFOL 500 MG/50ML IV EMUL
INTRAVENOUS | Status: DC | PRN
  Administered 2017-07-03: 140 ug/kg/min via INTRAVENOUS

## 2017-07-03 MED ORDER — SODIUM CHLORIDE 0.9 % IV SOLN
INTRAVENOUS | Status: DC
  Administered 2017-07-03: 10:00:00 via INTRAVENOUS

## 2017-07-03 MED ORDER — SODIUM CHLORIDE 0.9 % IV SOLN: INTRAVENOUS | Status: DC

## 2017-07-03 MED ORDER — PROPOFOL 500 MG/50ML IV EMUL
INTRAVENOUS | Status: AC
  Filled 2017-07-03: qty 50

## 2017-07-03 NOTE — Transfer of Care (Signed)
Immediate Anesthesia Transfer of Care Note  Patient: Amy Myers  Procedure(s) Performed: COLONOSCOPY WITH PROPOFOL (N/A )  Patient Location: Endoscopy Unit  Anesthesia Type:General  Level of Consciousness: sedated  Airway & Oxygen Therapy: Patient Spontanous Breathing and Patient connected to nasal cannula oxygen  Post-op Assessment: Report given to RN and Post -op Vital signs reviewed and stable  Post vital signs: stable  Last Vitals:  Vitals:   07/03/17 1002 07/03/17 1055  BP: 132/76   Pulse: 75   Resp: 18   Temp: (!) 35.6 C (!) (P) 36.1 C  SpO2: 98%     Last Pain:  Vitals:   07/03/17 1055  TempSrc: (P) Tympanic         Complications: No apparent anesthesia complications

## 2017-07-03 NOTE — Anesthesia Post-op Follow-up Note (Signed)
Anesthesia QCDR form completed.        

## 2017-07-03 NOTE — Op Note (Addendum)
[facility] Patient Name: Amy Myers Procedure Date: 07/03/2017 10:16 AM MRN: [id number] Account #: [id number] Date of Birth: [birth year] Admit Type: Outpatient Age: 72 Room: [facility] ENDO ROOM 4 Gender: Female Note Status: Finalized Procedure:            Colonoscopy Indications:          High risk colon cancer surveillance: Personal history                        of colonic polyps Providers:            Manya Silvas, MD Referring MD:         Kathrine Haddock (Referring MD) Medicines:            Propofol per Anesthesia Complications:        No immediate complications. Procedure:            Pre-Anesthesia Assessment:                       - After reviewing the risks and benefits, the patient                        was deemed in satisfactory condition to undergo the                        procedure.                       After obtaining informed consent, the colonoscope was                        passed under direct vision. Throughout the procedure,                        the patient's blood pressure, pulse, and oxygen                        saturations were monitored continuously. The                        Colonoscope was introduced through the anus and                        advanced to the the cecum, identified by appendiceal                        orifice and ileocecal valve. The colonoscopy was                        performed without difficulty. The patient tolerated the                        procedure well. The quality of the bowel preparation                        was adequate to identify polyps. Findings:      A diminutive polyp was found in the descending colon. The polyp was       sessile. The polyp was removed with a cold snare. Resection and       retrieval were complete. That was in the descending area.  Stool had to be removed from the end of the cecum with a snare to       visualize that area.      Internal hemorrhoids were  found during retroflexion. The hemorrhoids       were small and Grade I (internal hemorrhoids that do not prolapse).      The exam was otherwise without abnormality. Impression:           - One diminutive polyp in the descending colon, removed                        with a cold snare. Resected and retrieved.                       - Internal hemorrhoids.                       - The examination was otherwise normal. Recommendation:       - Await pathology results. Manya Silvas, MD 07/03/2017 11:02:18 AM This report has been signed electronically. Number of Addenda: 0 Note Initiated On: 07/03/2017 10:16 AM Scope Withdrawal Time: 0 hours 10 minutes 35 seconds  Total Procedure Duration: 0 hours 24 minutes 4 seconds       [facility]

## 2017-07-03 NOTE — Anesthesia Postprocedure Evaluation (Signed)
Anesthesia Post Note  Patient: Amy Myers  Procedure(s) Performed: COLONOSCOPY WITH PROPOFOL (N/A )  Patient location during evaluation: Endoscopy Anesthesia Type: General Level of consciousness: awake and alert Pain management: pain level controlled Vital Signs Assessment: post-procedure vital signs reviewed and stable Respiratory status: spontaneous breathing, nonlabored ventilation, respiratory function stable and patient connected to nasal cannula oxygen Cardiovascular status: blood pressure returned to baseline and stable Postop Assessment: no apparent nausea or vomiting Anesthetic complications: no     Last Vitals:  Vitals:   07/03/17 1058 07/03/17 1105  BP: 93/61 98/84  Pulse:    Resp:    Temp:    SpO2:      Last Pain:  Vitals:   07/03/17 1105  TempSrc:   PainSc: 0-No pain                 Precious Haws Alexsander Cavins
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
## 2017-07-04 LAB — SURGICAL PATHOLOGY

## 2017-07-05 ENCOUNTER — Encounter: Payer: Self-pay | Admitting: Unknown Physician Specialty

## 2017-07-05 ENCOUNTER — Ambulatory Visit: Payer: Medicare Other | Admitting: Unknown Physician Specialty

## 2017-07-08 DIAGNOSIS — I10 Essential (primary) hypertension: Secondary | ICD-10-CM | POA: Diagnosis not present

## 2017-07-08 DIAGNOSIS — I2510 Atherosclerotic heart disease of native coronary artery without angina pectoris: Secondary | ICD-10-CM | POA: Diagnosis not present

## 2017-07-08 DIAGNOSIS — I340 Nonrheumatic mitral (valve) insufficiency: Secondary | ICD-10-CM | POA: Diagnosis not present

## 2017-07-08 DIAGNOSIS — I4891 Unspecified atrial fibrillation: Secondary | ICD-10-CM | POA: Diagnosis not present

## 2017-07-11 ENCOUNTER — Other Ambulatory Visit: Payer: Self-pay | Admitting: Unknown Physician Specialty

## 2017-07-11 NOTE — Telephone Encounter (Signed)
levothyroxine refill Last OV: 01/04/17 Last Refill:04/15/17 #90 tabs no RF Pharmacy:[facility] [address] [place] PCP: Kathrine Haddock NP Last TSH 01/04/17 0.343

## 2017-07-19 DIAGNOSIS — I129 Hypertensive chronic kidney disease with stage 1 through stage 4 chronic kidney disease, or unspecified chronic kidney disease: Secondary | ICD-10-CM | POA: Diagnosis not present

## 2017-07-19 DIAGNOSIS — R600 Localized edema: Secondary | ICD-10-CM | POA: Diagnosis not present

## 2017-07-19 DIAGNOSIS — N183 Chronic kidney disease, stage 3 (moderate): Secondary | ICD-10-CM | POA: Diagnosis not present

## 2017-07-26 ENCOUNTER — Other Ambulatory Visit: Payer: Self-pay

## 2017-07-26 MED ORDER — RALOXIFENE HCL 60 MG PO TABS
60.0000 mg | ORAL_TABLET | Freq: Every day | ORAL | 6 refills | Status: DC
Start: 2017-07-26 — End: 2018-03-17

## 2017-07-26 MED ORDER — ALLOPURINOL 100 MG PO TABS: 100.0000 mg | ORAL_TABLET | Freq: Two times a day (BID) | ORAL | 5 refills | Status: DC

## 2017-07-26 NOTE — Telephone Encounter (Signed)
Patient last seen 01/04/17 and has f/up 07/30/17.

## 2017-07-30 ENCOUNTER — Encounter: Payer: Self-pay | Admitting: Unknown Physician Specialty

## 2017-07-30 ENCOUNTER — Ambulatory Visit (INDEPENDENT_AMBULATORY_CARE_PROVIDER_SITE_OTHER): Payer: Medicare Other | Admitting: Unknown Physician Specialty

## 2017-07-30 DIAGNOSIS — I482 Chronic atrial fibrillation, unspecified: Secondary | ICD-10-CM

## 2017-07-30 DIAGNOSIS — I129 Hypertensive chronic kidney disease with stage 1 through stage 4 chronic kidney disease, or unspecified chronic kidney disease: Secondary | ICD-10-CM

## 2017-07-30 DIAGNOSIS — N183 Chronic kidney disease, stage 3 unspecified: Secondary | ICD-10-CM

## 2017-07-30 DIAGNOSIS — E039 Hypothyroidism, unspecified: Secondary | ICD-10-CM | POA: Diagnosis not present

## 2017-07-30 NOTE — Progress Notes (Signed)
BP 112/69   Pulse (!) 50   Temp (!) 97.4 F (36.3 C) (Oral)   Ht 4' 11.7" (1.516 m)   Wt 183 lb 11.2 oz (83.3 kg)   LMP  (LMP Unknown)   SpO2 96%   BMI 36.24 kg/m    Subjective:    Patient ID: Amy Myers, female    DOB: 08/12/1945, 72 y.o.   MRN: [id number]  HPI: Amy Myers is a 72 y.o. female  Chief Complaint  Patient presents with  . Hyperlipidemia  . Hypertension  . Hypothyroidism   Hypertension Using medications without difficulty.  She is complaining of a cough.   Average home BPs Not checking   No problems or lightheadedness No chest pain with exertion or shortness of breath No Edema  Hypothyroid Changed dose for suppressed TSH.  Needs recheck today.  Feels good but tires easily  Hyperlipidemia Using medications without problems: No Muscle aches   A-fib Seeing cardiology and recently changed to Metoprolol.  Follow up with cardiology soon.  On Elquis  CKD Seeing [facility].  Unable to view notes today .     Relevant past medical, surgical, family and social history reviewed and updated as indicated. Interim medical history since our last visit reviewed. Allergies and medications reviewed and updated.  Review of Systems  Constitutional: Positive for fatigue.  HENT: Negative.   Respiratory: Negative.   Cardiovascular: Negative.   Psychiatric/Behavioral: Negative.     Per HPI unless specifically indicated above     Objective:    BP 112/69   Pulse (!) 50   Temp (!) 97.4 F (36.3 C) (Oral)   Ht 4' 11.7" (1.516 m)   Wt 183 lb 11.2 oz (83.3 kg)   LMP  (LMP Unknown)   SpO2 96%   BMI 36.24 kg/m   Wt Readings from Last 3 Encounters:  07/30/17 183 lb 11.2 oz (83.3 kg)  07/03/17 184 lb (83.5 kg)  01/04/17 184 lb 3.2 oz (83.6 kg)    Physical Exam  Constitutional: She is oriented to person, place, and time. She appears well-developed and well-nourished.  HENT:  Head: Normocephalic and atraumatic.  Eyes: Pupils are equal, round, and  reactive to light. Right eye exhibits no discharge. Left eye exhibits no discharge. No scleral icterus.  Neck: Normal range of motion. Neck supple. Carotid bruit is not present. No thyromegaly present.  Cardiovascular: Normal rate, regular rhythm and normal heart sounds. Exam reveals no gallop and no friction rub.  No murmur heard. Pulmonary/Chest: Effort normal and breath sounds normal. No respiratory distress. She has no wheezes. She has no rales. No breast tenderness or discharge.  Abdominal: There is no tenderness. There is no rebound.  Genitourinary: No breast tenderness or discharge.  Musculoskeletal: Normal range of motion.  Lymphadenopathy:    She has no cervical adenopathy.  Neurological: She is alert and oriented to person, place, and time.  Skin: Skin is warm, dry and intact. No rash noted.  Psychiatric: She has a normal mood and affect. Her speech is normal and behavior is normal. Judgment and thought content normal. Cognition and memory are normal.      Assessment & Plan:   Problem List Items Addressed This Visit      Unprioritized   Benign hypertension with chronic kidney disease, stage III (HCC)    BP is good today.  Has a cough.  Pt desired not to switch off of ACE.  Will continue to follow  Relevant Medications   metoprolol succinate (TOPROL-XL) 100 MG 24 hr tablet   apixaban (ELIQUIS) 5 MG TABS tablet   Chronic atrial fibrillation (HCC)    Rate controlled on metoprolol.  On Elquis for stroke prophylaxis.  Normal rhythm today      Relevant Medications   metoprolol succinate (TOPROL-XL) 100 MG 24 hr tablet   apixaban (ELIQUIS) 5 MG TABS tablet   CKD (chronic kidney disease), stage III ([place])    Labs done at [facility]      Hypothyroidism    Recheck TSH today      Relevant Medications   metoprolol succinate (TOPROL-XL) 100 MG 24 hr tablet   Other Relevant Orders   TSH       Follow up plan: Return in about 6 months (around 01/29/2018) for  physical.

## 2017-07-30 NOTE — Assessment & Plan Note (Addendum)
Rate controlled on metoprolol.  On Elquis for stroke prophylaxis.  Normal rhythm today

## 2017-07-30 NOTE — Assessment & Plan Note (Signed)
Labs done at [facility]

## 2017-07-30 NOTE — Assessment & Plan Note (Signed)
BP is good today.  Has a cough.  Pt desired not to switch off of ACE.  Will continue to follow

## 2017-07-30 NOTE — Assessment & Plan Note (Signed)
Recheck TSH today.  

## 2017-07-31 ENCOUNTER — Encounter: Payer: Self-pay | Admitting: Unknown Physician Specialty

## 2017-07-31 LAB — TSH: TSH: 4.81 u[IU]/mL — ABNORMAL HIGH (ref 0.450–4.500)

## 2017-08-01 DIAGNOSIS — I2510 Atherosclerotic heart disease of native coronary artery without angina pectoris: Secondary | ICD-10-CM | POA: Diagnosis not present

## 2017-08-01 DIAGNOSIS — I10 Essential (primary) hypertension: Secondary | ICD-10-CM | POA: Diagnosis not present

## 2017-08-01 DIAGNOSIS — R0602 Shortness of breath: Secondary | ICD-10-CM | POA: Diagnosis not present

## 2017-08-01 DIAGNOSIS — G4730 Sleep apnea, unspecified: Secondary | ICD-10-CM | POA: Diagnosis not present

## 2017-08-01 DIAGNOSIS — I4891 Unspecified atrial fibrillation: Secondary | ICD-10-CM | POA: Diagnosis not present

## 2017-08-09 ENCOUNTER — Other Ambulatory Visit: Payer: Self-pay

## 2017-08-09 MED ORDER — AMLODIPINE BESYLATE 2.5 MG PO TABS
2.5000 mg | ORAL_TABLET | Freq: Every day | ORAL | 5 refills | Status: DC
Start: 2017-08-09 — End: 2018-01-29

## 2017-08-09 NOTE — Telephone Encounter (Signed)
Patient last seen 07/30/17 and has f/up 01/29/18.

## 2017-09-12 DIAGNOSIS — I2510 Atherosclerotic heart disease of native coronary artery without angina pectoris: Secondary | ICD-10-CM | POA: Diagnosis not present

## 2017-09-12 DIAGNOSIS — I4891 Unspecified atrial fibrillation: Secondary | ICD-10-CM | POA: Diagnosis not present

## 2017-09-12 DIAGNOSIS — I10 Essential (primary) hypertension: Secondary | ICD-10-CM | POA: Diagnosis not present

## 2017-09-12 DIAGNOSIS — G4730 Sleep apnea, unspecified: Secondary | ICD-10-CM | POA: Diagnosis not present

## 2017-09-12 DIAGNOSIS — R0602 Shortness of breath: Secondary | ICD-10-CM | POA: Diagnosis not present

## 2017-10-04 ENCOUNTER — Emergency Department: Payer: Medicare Other

## 2017-10-04 ENCOUNTER — Other Ambulatory Visit: Payer: Self-pay

## 2017-10-04 ENCOUNTER — Emergency Department
Admission: EM | Admit: 2017-10-04 | Discharge: 2017-10-04 | Disposition: A | Discharge status: Home / Self Care | Payer: Medicare Other | Attending: Emergency Medicine | Admitting: Emergency Medicine

## 2017-10-04 DIAGNOSIS — I2510 Atherosclerotic heart disease of native coronary artery without angina pectoris: Secondary | ICD-10-CM | POA: Diagnosis not present

## 2017-10-04 DIAGNOSIS — S0083XA Contusion of other part of head, initial encounter: Secondary | ICD-10-CM

## 2017-10-04 DIAGNOSIS — S29012A Strain of muscle and tendon of back wall of thorax, initial encounter: Secondary | ICD-10-CM

## 2017-10-04 DIAGNOSIS — Z79899 Other long term (current) drug therapy: Secondary | ICD-10-CM | POA: Insufficient documentation

## 2017-10-04 DIAGNOSIS — M545 Low back pain: Secondary | ICD-10-CM | POA: Diagnosis not present

## 2017-10-04 DIAGNOSIS — S0993XA Unspecified injury of face, initial encounter: Secondary | ICD-10-CM | POA: Diagnosis not present

## 2017-10-04 DIAGNOSIS — S3991XA Unspecified injury of abdomen, initial encounter: Secondary | ICD-10-CM | POA: Diagnosis not present

## 2017-10-04 DIAGNOSIS — Y92009 Unspecified place in unspecified non-institutional (private) residence as the place of occurrence of the external cause: Secondary | ICD-10-CM | POA: Insufficient documentation

## 2017-10-04 DIAGNOSIS — Y939 Activity, unspecified: Secondary | ICD-10-CM | POA: Diagnosis not present

## 2017-10-04 DIAGNOSIS — M546 Pain in thoracic spine: Secondary | ICD-10-CM | POA: Diagnosis not present

## 2017-10-04 DIAGNOSIS — Y999 Unspecified external cause status: Secondary | ICD-10-CM | POA: Insufficient documentation

## 2017-10-04 DIAGNOSIS — E039 Hypothyroidism, unspecified: Secondary | ICD-10-CM | POA: Insufficient documentation

## 2017-10-04 DIAGNOSIS — Z7901 Long term (current) use of anticoagulants: Secondary | ICD-10-CM | POA: Insufficient documentation

## 2017-10-04 DIAGNOSIS — N189 Chronic kidney disease, unspecified: Secondary | ICD-10-CM | POA: Diagnosis not present

## 2017-10-04 DIAGNOSIS — I129 Hypertensive chronic kidney disease with stage 1 through stage 4 chronic kidney disease, or unspecified chronic kidney disease: Secondary | ICD-10-CM | POA: Insufficient documentation

## 2017-10-04 DIAGNOSIS — S299XXA Unspecified injury of thorax, initial encounter: Secondary | ICD-10-CM | POA: Insufficient documentation

## 2017-10-04 DIAGNOSIS — R6884 Jaw pain: Secondary | ICD-10-CM | POA: Diagnosis not present

## 2017-10-04 DIAGNOSIS — W109XXA Fall (on) (from) unspecified stairs and steps, initial encounter: Secondary | ICD-10-CM | POA: Diagnosis not present

## 2017-10-04 DIAGNOSIS — W19XXXA Unspecified fall, initial encounter: Secondary | ICD-10-CM

## 2017-10-04 DIAGNOSIS — S0990XA Unspecified injury of head, initial encounter: Secondary | ICD-10-CM | POA: Diagnosis not present

## 2017-10-04 LAB — COMPREHENSIVE METABOLIC PANEL
ALT: 17 U/L (ref 14–54)
AST: 28 U/L (ref 15–41)
Albumin: 4.2 g/dL (ref 3.5–5.0)
Alkaline Phosphatase: 61 U/L (ref 38–126)
Anion gap: 9 (ref 5–15)
BUN: 18 mg/dL (ref 6–20)
CO2: 23 mmol/L (ref 22–32)
Calcium: 9.1 mg/dL (ref 8.9–10.3)
Chloride: 108 mmol/L (ref 101–111)
Creatinine, Ser: 1.47 mg/dL — ABNORMAL HIGH (ref 0.44–1.00)
GFR calc Af Amer: 40 mL/min — ABNORMAL LOW (ref >60)
GFR calc non Af Amer: 34 mL/min — ABNORMAL LOW (ref >60)
Glucose, Bld: 129 mg/dL — ABNORMAL HIGH (ref 65–99)
Potassium: 4.1 mmol/L (ref 3.5–5.1)
Sodium: 140 mmol/L (ref 135–145)
Total Bilirubin: 0.5 mg/dL (ref 0.3–1.2)
Total Protein: 7.5 g/dL (ref 6.5–8.1)

## 2017-10-04 LAB — CBC WITH DIFFERENTIAL/PLATELET
Basophils Absolute: 0.1 10*3/uL (ref 0–0.1)
Basophils Relative: 1 %
Eosinophils Absolute: 0.1 10*3/uL (ref 0–0.7)
Eosinophils Relative: 1 %
HCT: 41.4 % (ref 35.0–47.0)
Hemoglobin: 13.7 g/dL (ref 12.0–16.0)
Lymphocytes Relative: 8 %
Lymphs Abs: 0.9 10*3/uL — ABNORMAL LOW (ref 1.0–3.6)
MCH: 30.4 pg (ref 26.0–34.0)
MCHC: 33 g/dL (ref 32.0–36.0)
MCV: 91.9 fL (ref 80.0–100.0)
Monocytes Absolute: 0.3 10*3/uL (ref 0.2–0.9)
Monocytes Relative: 3 %
Neutro Abs: 9.3 10*3/uL — ABNORMAL HIGH (ref 1.4–6.5)
Neutrophils Relative %: 87 %
Platelets: 131 10*3/uL — ABNORMAL LOW (ref 150–440)
RBC: 4.5 MIL/uL (ref 3.80–5.20)
RDW: 17.1 % — ABNORMAL HIGH (ref 11.5–14.5)
WBC: 10.7 10*3/uL (ref 3.6–11.0)

## 2017-10-04 MED ORDER — IOPAMIDOL (ISOVUE-300) INJECTION 61%
75.0000 mL | Freq: Once | INTRAVENOUS | Status: AC | PRN
  Administered 2017-10-04: 75 mL via INTRAVENOUS
  Filled 2017-10-04: qty 75

## 2017-10-04 MED ORDER — METHOCARBAMOL 500 MG PO TABS: 500.0000 mg | ORAL_TABLET | Freq: Three times a day (TID) | ORAL | 0 refills | Status: DC | PRN

## 2017-10-04 MED ORDER — PREDNISONE 50 MG PO TABS: 50.0000 mg | ORAL_TABLET | Freq: Every day | ORAL | 0 refills | Status: DC

## 2017-10-04 MED ORDER — SODIUM CHLORIDE 0.9 % IV BOLUS
1000.0000 mL | Freq: Once | INTRAVENOUS | Status: AC
  Administered 2017-10-04: 1000 mL via INTRAVENOUS

## 2017-10-04 MED ORDER — TRAMADOL HCL 50 MG PO TABS: 50.0000 mg | ORAL_TABLET | Freq: Four times a day (QID) | ORAL | 0 refills | Status: DC | PRN
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
## 2017-10-04 NOTE — ED Triage Notes (Addendum)
Pt states she fell down approx 5-6 stairs. Now c/o back pain and dental pain. No loss of skin integrity. No LOC. Pt alert and oriented X4, active, cooperative, pt in NAD. RR even and unlabored, color WNL.  Pt ambulatory. Pt denies any dizziness, weakness prior to or after fall.  States that she "is pretty sure I slipped"

## 2017-10-22 ENCOUNTER — Other Ambulatory Visit: Payer: Self-pay | Admitting: Family Medicine

## 2017-10-22 DIAGNOSIS — Z1231 Encounter for screening mammogram for malignant neoplasm of breast: Secondary | ICD-10-CM

## 2017-11-02 ENCOUNTER — Other Ambulatory Visit: Payer: Self-pay | Admitting: Unknown Physician Specialty

## 2017-11-04 ENCOUNTER — Other Ambulatory Visit: Payer: Self-pay

## 2017-11-04 MED ORDER — LISINOPRIL 40 MG PO TABS: 40.0000 mg | ORAL_TABLET | Freq: Every day | ORAL | 6 refills | Status: DC

## 2017-11-04 NOTE — Telephone Encounter (Signed)
Patient last seen 07/30/17 and has appointment 01/29/18.

## 2017-11-14 ENCOUNTER — Encounter: Payer: Self-pay | Admitting: Family Medicine

## 2017-11-14 ENCOUNTER — Ambulatory Visit
Admission: RE | Admit: 2017-11-14 | Discharge: 2017-11-14 | Disposition: A | Discharge status: Home / Self Care | Payer: Medicare Other | Source: Ambulatory Visit | Attending: Family Medicine | Admitting: Family Medicine

## 2017-11-14 DIAGNOSIS — Z1231 Encounter for screening mammogram for malignant neoplasm of breast: Secondary | ICD-10-CM | POA: Diagnosis not present

## 2017-11-21 DIAGNOSIS — N183 Chronic kidney disease, stage 3 (moderate): Secondary | ICD-10-CM | POA: Diagnosis not present

## 2017-11-21 DIAGNOSIS — R600 Localized edema: Secondary | ICD-10-CM | POA: Diagnosis not present

## 2017-11-21 DIAGNOSIS — I129 Hypertensive chronic kidney disease with stage 1 through stage 4 chronic kidney disease, or unspecified chronic kidney disease: Secondary | ICD-10-CM | POA: Diagnosis not present

## 2017-12-11 ENCOUNTER — Ambulatory Visit (INDEPENDENT_AMBULATORY_CARE_PROVIDER_SITE_OTHER): Payer: Medicare Other

## 2017-12-11 VITALS — BP 106/62 | HR 63 | Temp 98.2°F | Resp 17 | Ht 60.0 in | Wt 170.5 lb

## 2017-12-11 DIAGNOSIS — Z0000 Encounter for general adult medical examination without abnormal findings: Secondary | ICD-10-CM

## 2017-12-11 NOTE — Patient Instructions (Addendum)
[name] , Thank you for taking time to come for your Medicare Wellness Visit. I appreciate your ongoing commitment to your health goals. Please review the following plan we discussed and let me know if I can assist you in the future.   Screening recommendations/referrals: Colonoscopy: completed 07/03/2017  Mammogram: completed 11/14/2017  Bone Density: completed 01/18/2016 Recommended yearly ophthalmology/optometry visit for glaucoma screening and checkup Recommended yearly dental visit for hygiene and checkup  Vaccinations: Influenza vaccine: due 01/2018 Pneumococcal vaccine: completed series Tdap vaccine: due, check with your insurance company for coverage  Shingles vaccine: shingrix eligible, check with your insurance company for coverage     Advanced directives: copy on file  Conditions/risks identified: Recommend drinking at least 6-8 glasses of water a day   Next appointment: Follow up in one year for your annual wellness exam.    Preventive Care 65 Years and Older, Female Preventive care refers to lifestyle choices and visits with your health care provider that can promote health and wellness. What does preventive care include?  A yearly physical exam. This is also called an annual well check.  Dental exams once or twice a year.  Routine eye exams. Ask your health care provider how often you should have your eyes checked.  Personal lifestyle choices, including:  Daily care of your teeth and gums.  Regular physical activity.  Eating a healthy diet.  Avoiding tobacco and drug use.  Limiting alcohol use.  Practicing safe sex.  Taking low-dose aspirin every day.  Taking vitamin and mineral supplements as recommended by your health care provider. What happens during an annual well check? The services and screenings done by your health care provider during your annual well check will depend on your age, overall health, lifestyle risk factors, and family history of  disease. Counseling  Your health care provider may ask you questions about your:  Alcohol use.  Tobacco use.  Drug use.  Emotional well-being.  Home and relationship well-being.  Sexual activity.  Eating habits.  History of falls.  Memory and ability to understand (cognition).  Work and work Statistician.  Reproductive health. Screening  You may have the following tests or measurements:  Height, weight, and BMI.  Blood pressure.  Lipid and cholesterol levels. These may be checked every 5 years, or more frequently if you are over 65 years old.  Skin check.  Lung cancer screening. You may have this screening every year starting at age 79 if you have a 30-pack-year history of smoking and currently smoke or have quit within the past 15 years.  Fecal occult blood test (FOBT) of the stool. You may have this test every year starting at age 3.  Flexible sigmoidoscopy or colonoscopy. You may have a sigmoidoscopy every 5 years or a colonoscopy every 10 years starting at age 62.  Hepatitis C blood test.  Hepatitis B blood test.  Sexually transmitted disease (STD) testing.  Diabetes screening. This is done by checking your blood sugar (glucose) after you have not eaten for a while (fasting). You may have this done every 1-3 years.  Bone density scan. This is done to screen for osteoporosis. You may have this done starting at age 2.  Mammogram. This may be done every 1-2 years. Talk to your health care provider about how often you should have regular mammograms. Talk with your health care provider about your test results, treatment options, and if necessary, the need for more tests. Vaccines  Your health care provider may recommend certain  vaccines, such as:  Influenza vaccine. This is recommended every year.  Tetanus, diphtheria, and acellular pertussis (Tdap, Td) vaccine. You may need a Td booster every 10 years.  Zoster vaccine. You may need this after age  59.  Pneumococcal 13-valent conjugate (PCV13) vaccine. One dose is recommended after age 59.  Pneumococcal polysaccharide (PPSV23) vaccine. One dose is recommended after age 21. Talk to your health care provider about which screenings and vaccines you need and how often you need them. This information is not intended to replace advice given to you by your health care provider. Make sure you discuss any questions you have with your health care provider. Document Released: 04/29/2015 Document Revised: 12/21/2015 Document Reviewed: 02/01/2015 Elsevier Interactive Patient Education  2017 [place] Prevention in the Home Falls can cause injuries. They can happen to people of all ages. There are many things you can do to make your home safe and to help prevent falls. What can I do on the outside of my home?  Regularly fix the edges of walkways and driveways and fix any cracks.  Remove anything that might make you trip as you walk through a door, such as a raised step or threshold.  Trim any bushes or trees on the path to your home.  Use bright outdoor lighting.  Clear any walking paths of anything that might make someone trip, such as rocks or tools.  Regularly check to see if handrails are loose or broken. Make sure that both sides of any steps have handrails.  Any raised decks and porches should have guardrails on the edges.  Have any leaves, snow, or ice cleared regularly.  Use sand or salt on walking paths during winter.  Clean up any spills in your garage right away. This includes oil or grease spills. What can I do in the bathroom?  Use night lights.  Install grab bars by the toilet and in the tub and shower. Do not use towel bars as grab bars.  Use non-skid mats or decals in the tub or shower.  If you need to sit down in the shower, use a plastic, non-slip stool.  Keep the floor dry. Clean up any water that spills on the floor as soon as it happens.  Remove  soap buildup in the tub or shower regularly.  Attach bath mats securely with double-sided non-slip rug tape.  Do not have throw rugs and other things on the floor that can make you trip. What can I do in the bedroom?  Use night lights.  Make sure that you have a light by your bed that is easy to reach.  Do not use any sheets or blankets that are too big for your bed. They should not hang down onto the floor.  Have a firm chair that has side arms. You can use this for support while you get dressed.  Do not have throw rugs and other things on the floor that can make you trip. What can I do in the kitchen?  Clean up any spills right away.  Avoid walking on wet floors.  Keep items that you use a lot in easy-to-reach places.  If you need to reach something above you, use a strong step stool that has a grab bar.  Keep electrical cords out of the way.  Do not use floor polish or wax that makes floors slippery. If you must use wax, use non-skid floor wax.  Do not have throw rugs and other things  on the floor that can make you trip. What can I do with my stairs?  Do not leave any items on the stairs.  Make sure that there are handrails on both sides of the stairs and use them. Fix handrails that are broken or loose. Make sure that handrails are as long as the stairways.  Check any carpeting to make sure that it is firmly attached to the stairs. Fix any carpet that is loose or worn.  Avoid having throw rugs at the top or bottom of the stairs. If you do have throw rugs, attach them to the floor with carpet tape.  Make sure that you have a light switch at the top of the stairs and the bottom of the stairs. If you do not have them, ask someone to add them for you. What else can I do to help prevent falls?  Wear shoes that:  Do not have high heels.  Have rubber bottoms.  Are comfortable and fit you well.  Are closed at the toe. Do not wear sandals.  If you use a  stepladder:  Make sure that it is fully opened. Do not climb a closed stepladder.  Make sure that both sides of the stepladder are locked into place.  Ask someone to hold it for you, if possible.  Clearly mark and make sure that you can see:  Any grab bars or handrails.  First and last steps.  Where the edge of each step is.  Use tools that help you move around (mobility aids) if they are needed. These include:  Canes.  Walkers.  Scooters.  Crutches.  Turn on the lights when you go into a dark area. Replace any light bulbs as soon as they burn out.  Set up your furniture so you have a clear path. Avoid moving your furniture around.  If any of your floors are uneven, fix them.  If there are any pets around you, be aware of where they are.  Review your medicines with your doctor. Some medicines can make you feel dizzy. This can increase your chance of falling. Ask your doctor what other things that you can do to help prevent falls. This information is not intended to replace advice given to you by your health care provider. Make sure you discuss any questions you have with your health care provider. Document Released: 01/27/2009 Document Revised: 09/08/2015 Document Reviewed: 05/07/2014 Elsevier Interactive Patient Education  2017 [employer].
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
## 2017-12-13 DIAGNOSIS — I340 Nonrheumatic mitral (valve) insufficiency: Secondary | ICD-10-CM | POA: Diagnosis not present

## 2017-12-13 DIAGNOSIS — I10 Essential (primary) hypertension: Secondary | ICD-10-CM | POA: Diagnosis not present

## 2017-12-13 DIAGNOSIS — I4891 Unspecified atrial fibrillation: Secondary | ICD-10-CM | POA: Diagnosis not present

## 2017-12-19 ENCOUNTER — Ambulatory Visit: Payer: Medicare Other

## 2018-01-08 ENCOUNTER — Other Ambulatory Visit: Payer: Self-pay | Admitting: Family Medicine

## 2018-01-29 ENCOUNTER — Encounter: Payer: Self-pay | Admitting: Nurse Practitioner

## 2018-01-29 ENCOUNTER — Other Ambulatory Visit: Payer: Self-pay

## 2018-01-29 ENCOUNTER — Encounter: Payer: Medicaid Other | Admitting: Unknown Physician Specialty

## 2018-01-29 ENCOUNTER — Ambulatory Visit (INDEPENDENT_AMBULATORY_CARE_PROVIDER_SITE_OTHER): Payer: Medicare Other | Admitting: Nurse Practitioner

## 2018-01-29 VITALS — BP 132/84 | HR 73 | Temp 97.6°F | Ht 59.3 in | Wt 173.0 lb

## 2018-01-29 DIAGNOSIS — I482 Chronic atrial fibrillation, unspecified: Secondary | ICD-10-CM | POA: Diagnosis not present

## 2018-01-29 DIAGNOSIS — I129 Hypertensive chronic kidney disease with stage 1 through stage 4 chronic kidney disease, or unspecified chronic kidney disease: Secondary | ICD-10-CM | POA: Diagnosis not present

## 2018-01-29 DIAGNOSIS — E039 Hypothyroidism, unspecified: Secondary | ICD-10-CM | POA: Diagnosis not present

## 2018-01-29 DIAGNOSIS — N183 Chronic kidney disease, stage 3 unspecified: Secondary | ICD-10-CM

## 2018-01-29 DIAGNOSIS — E7800 Pure hypercholesterolemia, unspecified: Secondary | ICD-10-CM | POA: Diagnosis not present

## 2018-01-29 DIAGNOSIS — Z0000 Encounter for general adult medical examination without abnormal findings: Secondary | ICD-10-CM

## 2018-01-29 DIAGNOSIS — Z23 Encounter for immunization: Secondary | ICD-10-CM | POA: Diagnosis not present

## 2018-01-29 NOTE — Assessment & Plan Note (Signed)
Chronic, ongoing.  Rate control with Metoprolol and on Eliqius. Continue current regimen.  Followed by Dr. Humphrey Rolls.

## 2018-01-29 NOTE — Progress Notes (Signed)
BP 132/84   Pulse 73   Temp 97.6 F (36.4 C) (Oral)   Ht 4' 11.3" (1.506 m)   Wt 173 lb (78.5 kg)   LMP  (LMP Unknown)   SpO2 97%   BMI 34.59 kg/m    Subjective:    Patient ID: Amy Myers, female    DOB: 10-Sep-1945, 72 y.o.   MRN: [id number]  HPI: Amy Myers is a 72 y.o. female presents for annual physical  Chief Complaint  Patient presents with  . Annual Exam   HYPERTENSION / HYPERLIPIDEMIA Satisfied with current treatment? yes Duration of hypertension: chronic BP monitoring frequency: rarely BP range:  BP medication side effects: no Past BP meds: does not recall Duration of hyperlipidemia: chronic Cholesterol medication side effects: no Cholesterol supplements: none Past cholesterol medications: does not recall Medication compliance: excellent compliance Aspirin: no Recent stressors: no Recurrent headaches: no Visual changes: no Palpitations: no Dyspnea: no Chest pain: no Lower extremity edema: no Dizzy/lightheaded: no   ATRIAL FIBRILLATION Atrial fibrillation status: stable Satisfied with current treatment: yes Medication side effects:  no Medication compliance: excellent compliance Etiology of atrial fibrillation:  Palpitations:  no Chest pain:  no Dyspnea on exertion:  no Orthopnea:  no Syncope:  no Edema:  no Ventricular rate control: B-blocker Anti-coagulation: long acting   Relevant past medical, surgical, family and social history reviewed and updated as indicated. Interim medical history since our last visit reviewed. Allergies and medications reviewed and updated.  Review of Systems  Constitutional: Negative for activity change, appetite change and fatigue.  HENT: Negative.   Eyes: Negative.   Respiratory: Negative for cough, chest tightness and shortness of breath.   Cardiovascular: Negative for chest pain, palpitations and leg swelling.  Gastrointestinal: Negative for abdominal distention and abdominal pain.  Endocrine:  Negative.   Genitourinary: Negative.   Musculoskeletal: Negative.   Allergic/Immunologic: Negative.   Neurological: Negative for dizziness, tremors, weakness, numbness and headaches.  Hematological: Negative.   Psychiatric/Behavioral: Negative.    Hypothyroid: Continues on daily medication.  Denies cold intolerance, fatigue, or constipation.  Time one recent fall in June 2019 without injury.    Per HPI unless specifically indicated above     Objective:    BP 132/84   Pulse 73   Temp 97.6 F (36.4 C) (Oral)   Ht 4' 11.3" (1.506 m)   Wt 173 lb (78.5 kg)   LMP  (LMP Unknown)   SpO2 97%   BMI 34.59 kg/m   Wt Readings from Last 3 Encounters:  01/29/18 173 lb (78.5 kg)  12/11/17 170 lb 8 oz (77.3 kg)  10/04/17 186 lb (84.4 kg)    Physical Exam  Constitutional: She is oriented to person, place, and time. She appears well-developed and well-nourished.  HENT:  Head: Normocephalic and atraumatic.  Right Ear: External ear normal.  Left Ear: External ear normal.  Nose: Nose normal.  Mouth/Throat: Oropharynx is clear and moist.  Eyes: Pupils are equal, round, and reactive to light. Conjunctivae and EOM are normal.  Neck: Normal range of motion. Neck supple.  Cardiovascular: Normal rate, regular rhythm, normal heart sounds and intact distal pulses.  Pulmonary/Chest: Effort normal and breath sounds normal. Right breast exhibits no inverted nipple, no mass, no nipple discharge and no skin change. Left breast exhibits no inverted nipple, no mass, no nipple discharge and no skin change.  Abdominal: Soft. Bowel sounds are normal.  Musculoskeletal: Normal range of motion.  Neurological: She is alert and oriented to  person, place, and time.  Skin: Skin is warm and dry.  Psychiatric: She has a normal mood and affect. Her behavior is normal. Judgment and thought content normal.     Results for orders placed or performed during the hospital encounter of 10/04/17  CBC with Differential    Result Value Ref Range   WBC 10.7 3.6 - 11.0 K/uL   RBC 4.50 3.80 - 5.20 MIL/uL   Hemoglobin 13.7 12.0 - 16.0 g/dL   HCT 41.4 35.0 - 47.0 %   MCV 91.9 80.0 - 100.0 fL   MCH 30.4 26.0 - 34.0 pg   MCHC 33.0 32.0 - 36.0 g/dL   RDW 17.1 (H) 11.5 - 14.5 %   Platelets 131 (L) 150 - 440 K/uL   Neutrophils Relative % 87 %   Neutro Abs 9.3 (H) 1.4 - 6.5 K/uL   Lymphocytes Relative 8 %   Lymphs Abs 0.9 (L) 1.0 - 3.6 K/uL   Monocytes Relative 3 %   Monocytes Absolute 0.3 0.2 - 0.9 K/uL   Eosinophils Relative 1 %   Eosinophils Absolute 0.1 0 - 0.7 K/uL   Basophils Relative 1 %   Basophils Absolute 0.1 0 - 0.1 K/uL  Comprehensive metabolic panel  Result Value Ref Range   Sodium 140 135 - 145 mmol/L   Potassium 4.1 3.5 - 5.1 mmol/L   Chloride 108 101 - 111 mmol/L   CO2 23 22 - 32 mmol/L   Glucose, Bld 129 (H) 65 - 99 mg/dL   BUN 18 6 - 20 mg/dL   Creatinine, Ser 1.47 (H) 0.44 - 1.00 mg/dL   Calcium 9.1 8.9 - 10.3 mg/dL   Total Protein 7.5 6.5 - 8.1 g/dL   Albumin 4.2 3.5 - 5.0 g/dL   AST 28 15 - 41 U/L   ALT 17 14 - 54 U/L   Alkaline Phosphatase 61 38 - 126 U/L   Total Bilirubin 0.5 0.3 - 1.2 mg/dL   GFR calc non Af Amer 34 (L) >60 mL/min   GFR calc Af Amer 40 (L) >60 mL/min   Anion gap 9 5 - 15      Assessment & Plan:   Problem List Items Addressed This Visit      Cardiovascular and Mediastinum   Chronic atrial fibrillation    Chronic, ongoing.  Rate control with Metoprolol and on Eliqius. Continue current regimen.  Followed by Dr. Humphrey Rolls.      Relevant Medications   amLODipine (NORVASC) 5 MG tablet   Benign hypertension with chronic kidney disease, stage III (HCC)    Chronic, ongoing. BP good today.  Followed by cardiology and nephrology.  Sees nephrology next in December.  Continue current medication regimen.  Lisinopril offers kidney protection.      Relevant Medications   amLODipine (NORVASC) 5 MG tablet     Endocrine   Hypothyroidism    Chronic, recheck TSH today  and adjust dose as needed.        Other   Hyperlipidemia    Chronic, stable.  Continue current medications.      Relevant Medications   amLODipine (NORVASC) 5 MG tablet    Other Visit Diagnoses    Flu vaccine need    -  Primary   Relevant Orders   Flu vaccine HIGH DOSE PF (Completed)   Annual physical exam       Relevant Orders   CBC w/Diff   Comp Met (CMET)   TSH   Uric acid  Lipid Profile   B12       Follow up plan: Return in about 3 months (around 05/01/2018), or if symptoms worsen or fail to improve, for HTN and a-fib.

## 2018-01-29 NOTE — Assessment & Plan Note (Signed)
Chronic, ongoing. BP good today.  Followed by cardiology and nephrology.  Sees nephrology next in December.  Continue current medication regimen.  Lisinopril offers kidney protection.

## 2018-01-29 NOTE — Assessment & Plan Note (Signed)
Chronic, recheck TSH today and adjust dose as needed.

## 2018-01-29 NOTE — Assessment & Plan Note (Signed)
Chronic, stable.  Continue current medications. 

## 2018-01-29 NOTE — Patient Instructions (Signed)

## 2018-01-30 ENCOUNTER — Other Ambulatory Visit: Payer: Self-pay | Admitting: Nurse Practitioner

## 2018-01-30 LAB — CBC WITH DIFFERENTIAL/PLATELET
Basophils Absolute: 0.1 10*3/uL (ref 0.0–0.2)
Basos: 1 %
EOS (ABSOLUTE): 0.3 10*3/uL (ref 0.0–0.4)
Eos: 5 %
Hematocrit: 40.9 % (ref 34.0–46.6)
Hemoglobin: 13 g/dL (ref 11.1–15.9)
Immature Grans (Abs): 0 10*3/uL (ref 0.0–0.1)
Immature Granulocytes: 0 %
Lymphocytes Absolute: 1.1 10*3/uL (ref 0.7–3.1)
Lymphs: 20 %
MCH: 30.3 pg (ref 26.6–33.0)
MCHC: 31.8 g/dL (ref 31.5–35.7)
MCV: 95 fL (ref 79–97)
Monocytes Absolute: 0.3 10*3/uL (ref 0.1–0.9)
Monocytes: 6 %
Neutrophils Absolute: 3.9 10*3/uL (ref 1.4–7.0)
Neutrophils: 68 %
Platelets: 159 10*3/uL (ref 150–450)
RBC: 4.29 x10E6/uL (ref 3.77–5.28)
RDW: 13.8 % (ref 12.3–15.4)
WBC: 5.7 10*3/uL (ref 3.4–10.8)

## 2018-01-30 LAB — COMPREHENSIVE METABOLIC PANEL
ALT: 9 IU/L (ref 0–32)
AST: 15 IU/L (ref 0–40)
Albumin/Globulin Ratio: 2.2 (ref 1.2–2.2)
Albumin: 4.3 g/dL (ref 3.5–4.8)
Alkaline Phosphatase: 68 IU/L (ref 39–117)
BUN/Creatinine Ratio: 10 — ABNORMAL LOW (ref 12–28)
BUN: 13 mg/dL (ref 8–27)
Bilirubin Total: 0.4 mg/dL (ref 0.0–1.2)
CO2: 25 mmol/L (ref 20–29)
Calcium: 9.3 mg/dL (ref 8.7–10.3)
Chloride: 104 mmol/L (ref 96–106)
Creatinine, Ser: 1.28 mg/dL — ABNORMAL HIGH (ref 0.57–1.00)
GFR calc Af Amer: 48 mL/min/{1.73_m2} — ABNORMAL LOW (ref >59)
GFR calc non Af Amer: 42 mL/min/{1.73_m2} — ABNORMAL LOW (ref >59)
Globulin, Total: 2 g/dL (ref 1.5–4.5)
Glucose: 91 mg/dL (ref 65–99)
Potassium: 4.3 mmol/L (ref 3.5–5.2)
Sodium: 144 mmol/L (ref 134–144)
Total Protein: 6.3 g/dL (ref 6.0–8.5)

## 2018-01-30 LAB — LIPID PANEL
Chol/HDL Ratio: 1.7 ratio (ref 0.0–4.4)
Cholesterol, Total: 167 mg/dL (ref 100–199)
HDL: 96 mg/dL (ref >39)
LDL Calculated: 56 mg/dL (ref 0–99)
Triglycerides: 76 mg/dL (ref 0–149)
VLDL Cholesterol Cal: 15 mg/dL (ref 5–40)

## 2018-01-30 LAB — VITAMIN B12: Vitamin B-12: 349 pg/mL (ref 232–1245)

## 2018-01-30 LAB — URIC ACID: Uric Acid: 3.7 mg/dL (ref 2.5–7.1)

## 2018-01-30 LAB — TSH: TSH: 0.79 u[IU]/mL (ref 0.450–4.500)

## 2018-01-30 MED ORDER — LEVOTHYROXINE SODIUM 75 MCG PO TABS: 75.0000 ug | ORAL_TABLET | Freq: Every day | ORAL | 3 refills | Status: DC

## 2018-01-30 NOTE — Progress Notes (Signed)
TSH on labs 01/29/18 = o.790.  Previous was 4.810.  On lower end of normal at this time.  Will decrease Levothyroxine dose to 75MCG PO QDAY and alert patient to dose change.  Have her return in 6 weeks for TSH recheck.  Currently on 100MCG daily.

## 2018-02-18 ENCOUNTER — Other Ambulatory Visit: Payer: Self-pay | Admitting: Family Medicine

## 2018-02-18 NOTE — Telephone Encounter (Signed)
Requested Prescriptions  Pending Prescriptions Disp Refills  . allopurinol (ZYLOPRIM) 100 MG tablet [Pharmacy Med Name: ALLOPURINOL 100 MG TABLET] 60 tablet 0    Sig: Take 1 tablet (100 mg total) by mouth 2 (two) times daily.     Endocrinology:  Gout Agents Failed - 02/18/2018 10:29 AM      Failed - Cr in normal range and within 360 days    Creatinine, Ser  Date Value Ref Range Status  01/29/2018 1.28 (H) 0.57 - 1.00 mg/dL Final         Passed - Uric Acid in normal range and within 360 days    Uric Acid  Date Value Ref Range Status  01/29/2018 3.7 2.5 - 7.1 mg/dL Final    Comment:               Therapeutic target for gout patients: <6.0         Passed - Valid encounter within last 12 months    Recent Outpatient Visits          2 weeks ago Flu vaccine need   [facility], Henrine Screws T, NP   6 months ago CKD (chronic kidney disease), stage III ([place])   [facility] Kathrine Haddock, NP   1 year ago Need for influenza vaccination   [facility] Kathrine Haddock, NP   1 year ago Benign hypertension with chronic kidney disease, stage III   [facility] Kathrine Haddock, NP   2 years ago Health care maintenance   [facility] Kathrine Haddock, NP      Future Appointments            In 3 weeks Cannady, Barbaraann Faster, NP [facility]   In 2 months Cannady, Barbaraann Faster, NP [facility]   In 17 months  [facility]

## 2018-03-17 ENCOUNTER — Other Ambulatory Visit: Payer: Self-pay

## 2018-03-17 ENCOUNTER — Encounter: Payer: Self-pay | Admitting: Nurse Practitioner

## 2018-03-17 ENCOUNTER — Other Ambulatory Visit: Payer: Self-pay | Admitting: Family Medicine

## 2018-03-17 ENCOUNTER — Ambulatory Visit (INDEPENDENT_AMBULATORY_CARE_PROVIDER_SITE_OTHER): Payer: Medicare Other | Admitting: Nurse Practitioner

## 2018-03-17 VITALS — BP 139/65 | HR 56 | Temp 97.4°F | Ht 59.0 in | Wt 174.0 lb

## 2018-03-17 DIAGNOSIS — E039 Hypothyroidism, unspecified: Secondary | ICD-10-CM

## 2018-03-17 NOTE — Patient Instructions (Signed)
Levothyroxine tablets What is this medicine? LEVOTHYROXINE (lee voe thye ROX een) is a thyroid hormone. This medicine can improve symptoms of thyroid deficiency such as slow speech, lack of energy, weight gain, hair loss, dry skin, and feeling cold. It also helps to treat goiter (an enlarged thyroid gland). It is also used to treat some kinds of thyroid cancer along with surgery and other medicines. This medicine may be used for other purposes; ask your health care provider or pharmacist if you have questions. COMMON BRAND NAME(S): Estre, Levo-T, Levothroid, Levoxyl, Synthroid, Thyro-Tabs, Unithroid What should I tell my health care provider before I take this medicine? They need to know if you have any of these conditions: -angina -blood clotting problems -diabetes -dieting or on a weight loss program -fertility problems -heart disease -high levels of thyroid hormone -pituitary gland problem -previous heart attack -an unusual or allergic reaction to levothyroxine, thyroid hormones, other medicines, foods, dyes, or preservatives -pregnant or trying to get pregnant -breast-feeding How should I use this medicine? Take this medicine by mouth with plenty of water. It is best to take on an empty stomach, at least 30 minutes before or 2 hours after food. Follow the directions on the prescription label. Take at the same time each day. Do not take your medicine more often than directed. Contact your pediatrician regarding the use of this medicine in children. While this drug may be prescribed for children and infants as young as a few days of age for selected conditions, precautions do apply. For infants, you may crush the tablet and place in a small amount of (5-10 ml or 1 to 2 teaspoonfuls) of water, breast milk, or non-soy based infant formula. Do not mix with soy-based infant formula. Give as directed. Overdosage: If you think you have taken too much of this medicine contact a poison control center  or emergency room at once. NOTE: This medicine is only for you. Do not share this medicine with others. What if I miss a dose? If you miss a dose, take it as soon as you can. If it is almost time for your next dose, take only that dose. Do not take double or extra doses. What may interact with this medicine? -amiodarone -antacids -anti-thyroid medicines -calcium supplements -carbamazepine -cholestyramine -colestipol -digoxin -female hormones, including contraceptive or birth control pills -iron supplements -ketamine -liquid nutrition products like Ensure -medicines for colds and breathing difficulties -medicines for diabetes -medicines for mental depression -medicines or herbals used to decrease weight or appetite -phenobarbital or other barbiturate medications -phenytoin -prednisone or other corticosteroids -rifabutin -rifampin -soy isoflavones -sucralfate -theophylline -warfarin This list may not describe all possible interactions. Give your health care provider a list of all the medicines, herbs, non-prescription drugs, or dietary supplements you use. Also tell them if you smoke, drink alcohol, or use illegal drugs. Some items may interact with your medicine. What should I watch for while using this medicine? Be sure to take this medicine with plenty of fluids. Some tablets may cause choking, gagging, or difficulty swallowing from the tablet getting stuck in your throat. Most of these problems disappear if the medicine is taken with the right amount of water or other fluids. Do not switch brands of this medicine unless your health care professional agrees with the change. Ask questions if you are uncertain. You will need regular exams and occasional blood tests to check the response to treatment. If you are receiving this medicine for an underactive thyroid, it may be several weeks   before you notice an improvement. Check with your doctor or health care professional if your  symptoms do not improve. It may be necessary for you to take this medicine for the rest of your life. Do not stop using this medicine unless your doctor or health care professional advises you to. This medicine can affect blood sugar levels. If you have diabetes, check your blood sugar as directed. You may lose some of your hair when you first start treatment. With time, this usually corrects itself. If you are going to have surgery, tell your doctor or health care professional that you are taking this medicine. What side effects may I notice from receiving this medicine? Side effects that you should report to your doctor or health care professional as soon as possible: -allergic reactions like skin rash, itching or hives, swelling of the face, lips, or tongue -chest pain -excessive sweating or intolerance to heat -fast or irregular heartbeat -nervousness -skin rash or hives -swelling of ankles, feet, or legs -tremors Side effects that usually do not require medical attention (report to your doctor or health care professional if they continue or are bothersome): -changes in appetite -changes in menstrual periods -diarrhea -hair loss -headache -trouble sleeping -weight loss This list may not describe all possible side effects. Call your doctor for medical advice about side effects. You may report side effects to FDA at 1-800-FDA-1088. Where should I keep my medicine? Keep out of the reach of children. Store at room temperature between 15 and 30 degrees C (59 and 86 degrees F). Protect from light and moisture. Keep container tightly closed. Throw away any unused medicine after the expiration date. NOTE: This sheet is a summary. It may not cover all possible information. If you have questions about this medicine, talk to your doctor, pharmacist, or health care provider.  2018 Elsevier/Gold Standard (2008-07-09 14:28:07)  

## 2018-03-17 NOTE — Assessment & Plan Note (Signed)
Chronic, ongoing with dose reduction to 75 MCG in October d/t TSH 0.790.  Per ATA guidelines for age goal is <6, although tight control not recommended.  Repeat TSH today and adjust doses as needed.

## 2018-03-17 NOTE — Telephone Encounter (Signed)
Fax from pharmacy.  Request for Evista 60mg . Patient last seen today.

## 2018-03-17 NOTE — Progress Notes (Signed)
BP 139/65   Pulse (!) 56   Temp (!) 97.4 F (36.3 C) (Oral)   Ht _0  (1.499 m)   Wt 174 lb (78.9 kg)   LMP  (LMP Unknown)   SpO2 100%   BMI 35.14 kg/m    Subjective:    Patient ID: Amy Myers, female    DOB: [birth year], 72 y.o.   MRN: [id number]  HPI: Amy Myers is a 72 y.o. female presents for follow-up hypothyroidism  Chief Complaint  Patient presents with  . Hypothyroidism    f/u   HYPOTHYROIDISM On 01/29/18 TSH was 0.790, prior to this it was 4.810.  Her dose was recently decreased to 66 MCG (was on 100 MCG) and she returns today for TSH recheck and exam.  Discussed at length with patient thyroid and adjustments in medications. Thyroid control status:stable Satisfied with current treatment? yes Medication side effects: no Medication compliance: excellent compliance Etiology of hypothyroidism:  Recent dose adjustment:yes Fatigue: no Cold intolerance: no Heat intolerance: no Weight gain: no Weight loss: no Constipation: no Diarrhea/loose stools: no Palpitations: no Lower extremity edema: no Anxiety/depressed mood: no  Relevant past medical, surgical, family and social history reviewed and updated as indicated. Interim medical history since our last visit reviewed. Allergies and medications reviewed and updated.  Review of Systems  Constitutional: Negative for activity change, appetite change and fatigue.  Respiratory: Negative for cough, chest tightness, shortness of breath and wheezing.   Cardiovascular: Negative for chest pain, palpitations and leg swelling.  Gastrointestinal: Negative for abdominal distention, abdominal pain, constipation, diarrhea, nausea and vomiting.  Endocrine: Negative for cold intolerance, heat intolerance, polydipsia, polyphagia and polyuria.  Musculoskeletal: Negative.   Skin: Negative.   Neurological: Negative for dizziness, syncope, weakness, light-headedness, numbness and headaches.  Psychiatric/Behavioral: Negative.      Per HPI unless specifically indicated above     Objective:    BP 139/65   Pulse (!) 56   Temp (!) 97.4 F (36.3 C) (Oral)   Ht _1  (1.499 m)   Wt 174 lb (78.9 kg)   LMP  (LMP Unknown)   SpO2 100%   BMI 35.14 kg/m   Wt Readings from Last 3 Encounters:  03/17/18 174 lb (78.9 kg)  01/29/18 173 lb (78.5 kg)  12/11/17 170 lb 8 oz (77.3 kg)    Physical Exam  Constitutional: She is oriented to person, place, and time. She appears well-developed and well-nourished.  HENT:  Head: Normocephalic.  Eyes: Pupils are equal, round, and reactive to light. Conjunctivae and EOM are normal. Right eye exhibits no discharge. Left eye exhibits no discharge.  Neck: Normal range of motion. Neck supple. No JVD present. Carotid bruit is not present. No thyromegaly present.  Cardiovascular: Normal heart sounds. An irregularly irregular rhythm present.  Pulmonary/Chest: Effort normal and breath sounds normal.  Abdominal: Soft. Bowel sounds are normal.  Lymphadenopathy:    She has no cervical adenopathy.  Neurological: She is alert and oriented to person, place, and time.  Skin: Skin is warm and dry.  Psychiatric: She has a normal mood and affect. Her behavior is normal. Judgment and thought content normal.  Nursing note and vitals reviewed.   Results for orders placed or performed in visit on 01/29/18  CBC w/Diff  Result Value Ref Range   WBC 5.7 3.4 - 10.8 x10E3/uL   RBC 4.29 3.77 - 5.28 x10E6/uL   Hemoglobin 13.0 11.1 - 15.9 g/dL   Hematocrit 40.9 34.0 - 46.6 %  MCV 95 79 - 97 fL   MCH 30.3 26.6 - 33.0 pg   MCHC 31.8 31.5 - 35.7 g/dL   RDW 13.8 12.3 - 15.4 %   Platelets 159 150 - 450 x10E3/uL   Neutrophils 68 Not Estab. %   Lymphs 20 Not Estab. %   Monocytes 6 Not Estab. %   Eos 5 Not Estab. %   Basos 1 Not Estab. %   Neutrophils Absolute 3.9 1.4 - 7.0 x10E3/uL   Lymphocytes Absolute 1.1 0.7 - 3.1 x10E3/uL   Monocytes Absolute 0.3 0.1 - 0.9 x10E3/uL   EOS (ABSOLUTE) 0.3 0.0  - 0.4 x10E3/uL   Basophils Absolute 0.1 0.0 - 0.2 x10E3/uL   Immature Granulocytes 0 Not Estab. %   Immature Grans (Abs) 0.0 0.0 - 0.1 x10E3/uL  Comp Met (CMET)  Result Value Ref Range   Glucose 91 65 - 99 mg/dL   BUN 13 8 - 27 mg/dL   Creatinine, Ser 1.28 (H) 0.57 - 1.00 mg/dL   GFR calc non Af Amer 42 (L) >59 mL/min/1.73   GFR calc Af Amer 48 (L) >59 mL/min/1.73   BUN/Creatinine Ratio 10 (L) 12 - 28   Sodium 144 134 - 144 mmol/L   Potassium 4.3 3.5 - 5.2 mmol/L   Chloride 104 96 - 106 mmol/L   CO2 25 20 - 29 mmol/L   Calcium 9.3 8.7 - 10.3 mg/dL   Total Protein 6.3 6.0 - 8.5 g/dL   Albumin 4.3 3.5 - 4.8 g/dL   Globulin, Total 2.0 1.5 - 4.5 g/dL   Albumin/Globulin Ratio 2.2 1.2 - 2.2   Bilirubin Total 0.4 0.0 - 1.2 mg/dL   Alkaline Phosphatase 68 39 - 117 IU/L   AST 15 0 - 40 IU/L   ALT 9 0 - 32 IU/L  TSH  Result Value Ref Range   TSH 0.790 0.450 - 4.500 uIU/mL  Uric acid  Result Value Ref Range   Uric Acid 3.7 2.5 - 7.1 mg/dL  Lipid Profile  Result Value Ref Range   Cholesterol, Total 167 100 - 199 mg/dL   Triglycerides 76 0 - 149 mg/dL   HDL 96 >39 mg/dL   VLDL Cholesterol Cal 15 5 - 40 mg/dL   LDL Calculated 56 0 - 99 mg/dL   Chol/HDL Ratio 1.7 0.0 - 4.4 ratio  B12  Result Value Ref Range   Vitamin B-12 349 232 - 1,245 pg/mL      Assessment & Plan:   Problem List Items Addressed This Visit      Endocrine   Hypothyroidism - Primary    Chronic, ongoing with dose reduction to 75 MCG in October d/t TSH 0.790.  Per ATA guidelines for age goal is <6, although tight control not recommended.  Repeat TSH today and adjust doses as needed.      Relevant Orders   TSH       Follow up plan: Return in about 1 month (around 04/17/2018) for HTN/HLD.

## 2018-03-18 ENCOUNTER — Telehealth: Payer: Self-pay | Admitting: Nurse Practitioner

## 2018-03-18 DIAGNOSIS — E039 Hypothyroidism, unspecified: Secondary | ICD-10-CM

## 2018-03-18 LAB — TSH: TSH: 7.66 u[IU]/mL — ABNORMAL HIGH (ref 0.450–4.500)

## 2018-03-18 MED ORDER — LEVOTHYROXINE SODIUM 100 MCG PO TABS: 100.0000 ug | ORAL_TABLET | Freq: Every day | ORAL | 3 refills | Status: DC

## 2018-03-18 NOTE — Telephone Encounter (Signed)
Spoke to Amy Myers via telephone.  Previous TSH 0.790 and her Levothyroxine was decreased from 100 MCG to 75 MCG.  On repeat TSH 03/17/18 level 7.660.  Will increase her Levothyroxine back to 100 MCG QAM and discussed with her to ensure she is taking it 30 minutes prior to her other medication of food in the morning.  She was able to repeat back instructions to provider.  Instructed her to start taking Levothyroxine 100 MCG which will be sent into pharmacy today.  Will have her come in office in 6 weeks for blood draw only, which she is aware of, for thyroid panel.

## 2018-03-26 ENCOUNTER — Encounter: Payer: Self-pay | Admitting: Nurse Practitioner

## 2018-03-26 DIAGNOSIS — E538 Deficiency of other specified B group vitamins: Secondary | ICD-10-CM | POA: Insufficient documentation

## 2018-03-28 DIAGNOSIS — I129 Hypertensive chronic kidney disease with stage 1 through stage 4 chronic kidney disease, or unspecified chronic kidney disease: Secondary | ICD-10-CM | POA: Diagnosis not present

## 2018-03-28 DIAGNOSIS — R600 Localized edema: Secondary | ICD-10-CM | POA: Diagnosis not present

## 2018-03-28 DIAGNOSIS — N183 Chronic kidney disease, stage 3 (moderate): Secondary | ICD-10-CM | POA: Diagnosis not present

## 2018-04-14 DIAGNOSIS — I2510 Atherosclerotic heart disease of native coronary artery without angina pectoris: Secondary | ICD-10-CM | POA: Diagnosis not present

## 2018-04-14 DIAGNOSIS — I4891 Unspecified atrial fibrillation: Secondary | ICD-10-CM | POA: Diagnosis not present

## 2018-04-14 DIAGNOSIS — I10 Essential (primary) hypertension: Secondary | ICD-10-CM | POA: Diagnosis not present

## 2018-04-14 DIAGNOSIS — E785 Hyperlipidemia, unspecified: Secondary | ICD-10-CM | POA: Diagnosis not present

## 2018-04-15 ENCOUNTER — Other Ambulatory Visit: Payer: Self-pay | Admitting: Family Medicine

## 2018-04-23 DIAGNOSIS — R079 Chest pain, unspecified: Secondary | ICD-10-CM | POA: Diagnosis not present

## 2018-04-23 DIAGNOSIS — I2510 Atherosclerotic heart disease of native coronary artery without angina pectoris: Secondary | ICD-10-CM | POA: Diagnosis not present

## 2018-04-28 DIAGNOSIS — G4730 Sleep apnea, unspecified: Secondary | ICD-10-CM | POA: Diagnosis not present

## 2018-04-28 DIAGNOSIS — R079 Chest pain, unspecified: Secondary | ICD-10-CM | POA: Diagnosis not present

## 2018-04-28 DIAGNOSIS — I2510 Atherosclerotic heart disease of native coronary artery without angina pectoris: Secondary | ICD-10-CM | POA: Diagnosis not present

## 2018-04-28 DIAGNOSIS — I4891 Unspecified atrial fibrillation: Secondary | ICD-10-CM | POA: Diagnosis not present

## 2018-05-01 ENCOUNTER — Ambulatory Visit (INDEPENDENT_AMBULATORY_CARE_PROVIDER_SITE_OTHER): Payer: Medicare Other | Admitting: Nurse Practitioner

## 2018-05-01 ENCOUNTER — Encounter: Payer: Self-pay | Admitting: Nurse Practitioner

## 2018-05-01 VITALS — BP 124/80 | HR 89 | Temp 97.5°F | Wt 175.4 lb

## 2018-05-01 DIAGNOSIS — N183 Chronic kidney disease, stage 3 unspecified: Secondary | ICD-10-CM

## 2018-05-01 DIAGNOSIS — E039 Hypothyroidism, unspecified: Secondary | ICD-10-CM

## 2018-05-01 DIAGNOSIS — I129 Hypertensive chronic kidney disease with stage 1 through stage 4 chronic kidney disease, or unspecified chronic kidney disease: Secondary | ICD-10-CM

## 2018-05-01 DIAGNOSIS — E7800 Pure hypercholesterolemia, unspecified: Secondary | ICD-10-CM | POA: Diagnosis not present

## 2018-05-01 NOTE — Progress Notes (Signed)
BP 124/80   Pulse 89   Temp (!) 97.5 F (36.4 C) (Oral)   Wt 175 lb 6.4 oz (79.6 kg)   LMP  (LMP Unknown)   SpO2 99%   BMI 35.43 kg/m    Subjective:    Patient ID: Amy Myers, female    DOB: 1945-08-04, 73 y.o.   MRN: [id number]  HPI: Amy Myers is a 73 y.o. female presents for follow-up  Chief Complaint  Patient presents with  . Hyperlipidemia  . Hypertension  . Hypothyroidism   HYPERTENSION / HYPERLIPIDEMIA WITH CKD Reports Dr. Chancy Milroy switched her to Hydralazine recently and she is to have CT scan on heart on February 3rd as they think she has a blockage.  She had a stress test and "it did not turn out too good". Satisfied with current treatment? yes Duration of hypertension: chronic BP monitoring frequency: rarely BP range:  BP medication side effects: no Duration of hyperlipidemia: chronic Cholesterol medication side effects: no Cholesterol supplements: none Medication compliance: good compliance Aspirin: no Recent stressors: no Recurrent headaches: no Visual changes: no Palpitations: no Dyspnea: no Chest pain: no Lower extremity edema: no Dizzy/lightheaded: no   HYPOTHYROIDISM Thyroid control status:stable Satisfied with current treatment? yes Medication side effects: no Medication compliance: excellent compliance Etiology of hypothyroidism:  Recent dose adjustment:yes, increased to 100 MCG in December d/t TSH 7.660 Fatigue: no Cold intolerance: no Heat intolerance: no Weight gain: no Weight loss: no Constipation: yes, baseline Diarrhea/loose stools: no Palpitations: no Lower extremity edema: no Anxiety/depressed mood: no  Relevant past medical, surgical, family and social history reviewed and updated as indicated. Interim medical history since our last visit reviewed. Allergies and medications reviewed and updated.  Review of Systems  Constitutional: Negative for activity change, appetite change, diaphoresis, fatigue and fever.    Respiratory: Negative for cough, chest tightness and shortness of breath.   Cardiovascular: Negative for chest pain, palpitations and leg swelling.  Gastrointestinal: Negative for abdominal distention, abdominal pain, constipation, diarrhea, nausea and vomiting.  Endocrine: Negative for cold intolerance, heat intolerance, polydipsia, polyphagia and polyuria.  Neurological: Negative for dizziness, syncope, weakness, light-headedness, numbness and headaches.  Psychiatric/Behavioral: Negative.     Per HPI unless specifically indicated above     Objective:    BP 124/80   Pulse 89   Temp (!) 97.5 F (36.4 C) (Oral)   Wt 175 lb 6.4 oz (79.6 kg)   LMP  (LMP Unknown)   SpO2 99%   BMI 35.43 kg/m   Wt Readings from Last 3 Encounters:  05/01/18 175 lb 6.4 oz (79.6 kg)  03/17/18 174 lb (78.9 kg)  01/29/18 173 lb (78.5 kg)    Physical Exam Vitals signs and nursing note reviewed.  Constitutional:      General: She is awake.     Appearance: She is well-developed.  HENT:     Head: Normocephalic.     Right Ear: Hearing normal.     Left Ear: Hearing normal.     Nose: Nose normal.     Mouth/Throat:     Mouth: Mucous membranes are moist.  Eyes:     General: Lids are normal.        Right eye: No discharge.        Left eye: No discharge.     Conjunctiva/sclera: Conjunctivae normal.     Pupils: Pupils are equal, round, and reactive to light.  Neck:     Musculoskeletal: Normal range of motion and neck supple.  Thyroid: No thyromegaly.     Vascular: No carotid bruit or JVD.  Cardiovascular:     Rate and Rhythm: Normal rate and regular rhythm.     Heart sounds: Normal heart sounds. No murmur. No gallop.   Pulmonary:     Effort: Pulmonary effort is normal.     Breath sounds: Normal breath sounds.  Abdominal:     General: Bowel sounds are normal.     Palpations: Abdomen is soft. There is no hepatomegaly or splenomegaly.  Musculoskeletal:     Right lower leg: No edema.     Left  lower leg: No edema.  Lymphadenopathy:     Cervical: No cervical adenopathy.  Skin:    General: Skin is warm and dry.  Neurological:     Mental Status: She is alert and oriented to person, place, and time.  Psychiatric:        Attention and Perception: Attention normal.        Mood and Affect: Mood normal.        Behavior: Behavior normal. Behavior is cooperative.        Thought Content: Thought content normal.        Judgment: Judgment normal.     Results for orders placed or performed in visit on 03/17/18  TSH  Result Value Ref Range   TSH 7.660 (H) 0.450 - 4.500 uIU/mL      Assessment & Plan:   Problem List Items Addressed This Visit      Cardiovascular and Mediastinum   Benign hypertension with chronic kidney disease, stage III (HCC)    Chronic, ongoing.  Followed by cardiology and nephrology.  Recent med changes made.  Continue current regimen.      Relevant Medications   hydrALAZINE (APRESOLINE) 25 MG tablet     Endocrine   Hypothyroidism - Primary    Chronic, ongoing.  Recent increase in Levothyroxine to 100 MCG.  Recheck thyroid panel today.  ATA guidelines recommend goal for age <6.  Adjust dose as needed.      Relevant Orders   Thyroid Panel With TSH     Genitourinary   CKD (chronic kidney disease), stage III ([place])    Followed by [facility], labs performed there.        Other   Hyperlipidemia    Chronic, ongoing.  Continue current medication regimen.        Relevant Medications   hydrALAZINE (APRESOLINE) 25 MG tablet       Follow up plan: Return in about 3 months (around 07/31/2018) for HTN/HLD.

## 2018-05-01 NOTE — Assessment & Plan Note (Signed)
Chronic, ongoing.  Recent increase in Levothyroxine to 100 MCG.  Recheck thyroid panel today.  ATA guidelines recommend goal for age <6.  Adjust dose as needed.

## 2018-05-01 NOTE — Patient Instructions (Signed)
Hypothyroidism  Hypothyroidism is when the thyroid gland does not make enough of certain hormones (it is underactive). The thyroid gland is a small gland located in the lower front part of the neck, just in front of the windpipe (trachea). This gland makes hormones that help control how the body uses food for energy (metabolism) as well as how the heart and brain function. These hormones also play a role in keeping your bones strong. When the thyroid is underactive, it produces too little of the hormones thyroxine (T4) and triiodothyronine (T3). What are the causes? This condition may be caused by:  Hashimoto's disease. This is a disease in which the body's disease-fighting system (immune system) attacks the thyroid gland. This is the most common cause.  Viral infections.  Pregnancy.  Certain medicines.  Birth defects.  Past radiation treatments to the head or neck for cancer.  Past treatment with radioactive iodine.  Past exposure to radiation in the environment.  Past surgical removal of part or all of the thyroid.  Problems with a gland in the center of the brain (pituitary gland).  Lack of enough iodine in the diet. What increases the risk? You are more likely to develop this condition if:  You are female.  You have a family history of thyroid conditions.  You use a medicine called lithium.  You take medicines that affect the immune system (immunosuppressants). What are the signs or symptoms? Symptoms of this condition include:  Feeling as though you have no energy (lethargy).  Not being able to tolerate cold.  Weight gain that is not explained by a change in diet or exercise habits.  Lack of appetite.  Dry skin.  Coarse hair.  Menstrual irregularity.  Slowing of thought processes.  Constipation.  Sadness or depression. How is this diagnosed? This condition may be diagnosed based on:  Your symptoms, your medical history, and a physical exam.  Blood  tests. You may also have imaging tests, such as an ultrasound or MRI. How is this treated? This condition is treated with medicine that replaces the thyroid hormones that your body does not make. After you begin treatment, it may take several weeks for symptoms to go away. Follow these instructions at home:  Take over-the-counter and prescription medicines only as told by your health care provider.  If you start taking any new medicines, tell your health care provider.  Keep all follow-up visits as told by your health care provider. This is important. ? As your condition improves, your dosage of thyroid hormone medicine may change. ? You will need to have blood tests regularly so that your health care provider can monitor your condition. Contact a health care provider if:  Your symptoms do not get better with treatment.  You are taking thyroid replacement medicine and you: ? Sweat a lot. ? Have tremors. ? Feel anxious. ? Lose weight rapidly. ? Cannot tolerate heat. ? Have emotional swings. ? Have diarrhea. ? Feel weak. Get help right away if you have:  Chest pain.  An irregular heartbeat.  A rapid heartbeat.  Difficulty breathing. Summary  Hypothyroidism is when the thyroid gland does not make enough of certain hormones (it is underactive).  When the thyroid is underactive, it produces too little of the hormones thyroxine (T4) and triiodothyronine (T3).  The most common cause is Hashimoto's disease, a disease in which the body's disease-fighting system (immune system) attacks the thyroid gland. The condition can also be caused by viral infections, medicine, pregnancy, or past   radiation treatment to the head or neck.  Symptoms may include weight gain, dry skin, constipation, feeling as though you do not have energy, and not being able to tolerate cold.  This condition is treated with medicine to replace the thyroid hormones that your body does not make. This information  is not intended to replace advice given to you by your health care provider. Make sure you discuss any questions you have with your health care provider. Document Released: 04/02/2005 Document Revised: 03/13/2017 Document Reviewed: 03/13/2017 Elsevier Interactive Patient Education  2019 [employer]  

## 2018-05-01 NOTE — Assessment & Plan Note (Signed)
Followed by [facility], labs performed there.

## 2018-05-01 NOTE — Assessment & Plan Note (Signed)
Chronic, ongoing.  Continue current medication regimen.   

## 2018-05-01 NOTE — Assessment & Plan Note (Signed)
Chronic, ongoing.  Followed by cardiology and nephrology.  Recent med changes made.  Continue current regimen.

## 2018-05-02 LAB — THYROID PANEL WITH TSH
Free Thyroxine Index: 3.5 (ref 1.2–4.9)
T3 Uptake Ratio: 30 % (ref 24–39)
T4, Total: 11.5 ug/dL (ref 4.5–12.0)
TSH: 0.921 u[IU]/mL (ref 0.450–4.500)

## 2018-05-12 DIAGNOSIS — I4891 Unspecified atrial fibrillation: Secondary | ICD-10-CM | POA: Diagnosis not present

## 2018-05-12 DIAGNOSIS — I10 Essential (primary) hypertension: Secondary | ICD-10-CM | POA: Diagnosis not present

## 2018-05-12 DIAGNOSIS — I2510 Atherosclerotic heart disease of native coronary artery without angina pectoris: Secondary | ICD-10-CM | POA: Diagnosis not present

## 2018-05-13 ENCOUNTER — Other Ambulatory Visit: Payer: Self-pay | Admitting: Nurse Practitioner

## 2018-05-19 DIAGNOSIS — R072 Precordial pain: Secondary | ICD-10-CM | POA: Diagnosis not present

## 2018-05-22 DIAGNOSIS — E785 Hyperlipidemia, unspecified: Secondary | ICD-10-CM | POA: Diagnosis not present

## 2018-05-22 DIAGNOSIS — I2510 Atherosclerotic heart disease of native coronary artery without angina pectoris: Secondary | ICD-10-CM | POA: Diagnosis not present

## 2018-05-22 DIAGNOSIS — I10 Essential (primary) hypertension: Secondary | ICD-10-CM | POA: Diagnosis not present

## 2018-05-22 DIAGNOSIS — I4891 Unspecified atrial fibrillation: Secondary | ICD-10-CM | POA: Diagnosis not present

## 2018-06-11 ENCOUNTER — Other Ambulatory Visit: Payer: Self-pay | Admitting: Nurse Practitioner

## 2018-06-11 NOTE — Telephone Encounter (Signed)
Requested Prescriptions  Pending Prescriptions Disp Refills  . raloxifene (EVISTA) 60 MG tablet [Pharmacy Med Name: RALOXIFENE HCL 60 MG TABLET] 30 tablet 1    Sig: Take 1 tablet (60 mg total) by mouth daily.     OB/GYN:  Selective Estrogen Receptor Modulators Passed - 06/11/2018 10:42 AM      Passed - Valid encounter within last 12 months    Recent Outpatient Visits          1 month ago Hypothyroidism, unspecified type   [place], [place] T, NP   2 months ago Hypothyroidism, unspecified type   [facility], Barbaraann Faster, NP   4 months ago Flu vaccine need   [facility], [place] T, NP   10 months ago CKD (chronic kidney disease), stage III ([place])   [facility] Kathrine Haddock, NP   1 year ago Need for influenza vaccination   [facility] Kathrine Haddock, NP      Future Appointments            In 1 month [place], Barbaraann Faster, NP [facility]   In 6 months  [facility]

## 2018-06-21 ENCOUNTER — Emergency Department: Payer: Medicare Other

## 2018-06-21 ENCOUNTER — Emergency Department
Admission: EM | Admit: 2018-06-21 | Discharge: 2018-06-21 | Disposition: A | Discharge status: Home / Self Care | Payer: Medicare Other | Attending: Emergency Medicine | Admitting: Emergency Medicine

## 2018-06-21 ENCOUNTER — Other Ambulatory Visit: Payer: Self-pay

## 2018-06-21 DIAGNOSIS — R52 Pain, unspecified: Secondary | ICD-10-CM | POA: Diagnosis not present

## 2018-06-21 DIAGNOSIS — Z79899 Other long term (current) drug therapy: Secondary | ICD-10-CM | POA: Insufficient documentation

## 2018-06-21 DIAGNOSIS — S8002XA Contusion of left knee, initial encounter: Secondary | ICD-10-CM

## 2018-06-21 DIAGNOSIS — Z23 Encounter for immunization: Secondary | ICD-10-CM | POA: Insufficient documentation

## 2018-06-21 DIAGNOSIS — M25562 Pain in left knee: Secondary | ICD-10-CM | POA: Diagnosis not present

## 2018-06-21 DIAGNOSIS — I4891 Unspecified atrial fibrillation: Secondary | ICD-10-CM | POA: Diagnosis not present

## 2018-06-21 DIAGNOSIS — N183 Chronic kidney disease, stage 3 (moderate): Secondary | ICD-10-CM | POA: Insufficient documentation

## 2018-06-21 DIAGNOSIS — E039 Hypothyroidism, unspecified: Secondary | ICD-10-CM | POA: Diagnosis not present

## 2018-06-21 DIAGNOSIS — S0083XA Contusion of other part of head, initial encounter: Secondary | ICD-10-CM | POA: Insufficient documentation

## 2018-06-21 DIAGNOSIS — W19XXXA Unspecified fall, initial encounter: Secondary | ICD-10-CM | POA: Diagnosis not present

## 2018-06-21 DIAGNOSIS — S0990XA Unspecified injury of head, initial encounter: Secondary | ICD-10-CM | POA: Insufficient documentation

## 2018-06-21 DIAGNOSIS — S8992XA Unspecified injury of left lower leg, initial encounter: Secondary | ICD-10-CM | POA: Diagnosis not present

## 2018-06-21 DIAGNOSIS — Z7901 Long term (current) use of anticoagulants: Secondary | ICD-10-CM | POA: Diagnosis not present

## 2018-06-21 DIAGNOSIS — Y92481 Parking lot as the place of occurrence of the external cause: Secondary | ICD-10-CM | POA: Diagnosis not present

## 2018-06-21 DIAGNOSIS — I2510 Atherosclerotic heart disease of native coronary artery without angina pectoris: Secondary | ICD-10-CM | POA: Diagnosis not present

## 2018-06-21 DIAGNOSIS — I129 Hypertensive chronic kidney disease with stage 1 through stage 4 chronic kidney disease, or unspecified chronic kidney disease: Secondary | ICD-10-CM | POA: Insufficient documentation

## 2018-06-21 DIAGNOSIS — Y998 Other external cause status: Secondary | ICD-10-CM | POA: Diagnosis not present

## 2018-06-21 DIAGNOSIS — S01511A Laceration without foreign body of lip, initial encounter: Secondary | ICD-10-CM | POA: Diagnosis not present

## 2018-06-21 DIAGNOSIS — S0993XA Unspecified injury of face, initial encounter: Secondary | ICD-10-CM | POA: Diagnosis not present

## 2018-06-21 DIAGNOSIS — W01198A Fall on same level from slipping, tripping and stumbling with subsequent striking against other object, initial encounter: Secondary | ICD-10-CM | POA: Diagnosis not present

## 2018-06-21 DIAGNOSIS — R609 Edema, unspecified: Secondary | ICD-10-CM | POA: Diagnosis not present

## 2018-06-21 DIAGNOSIS — Y9389 Activity, other specified: Secondary | ICD-10-CM | POA: Diagnosis not present

## 2018-06-21 MED ORDER — TETANUS-DIPHTH-ACELL PERTUSSIS 5-2.5-18.5 LF-MCG/0.5 IM SUSP
0.5000 mL | Freq: Once | INTRAMUSCULAR | Status: AC
  Administered 2018-06-21: 0.5 mL via INTRAMUSCULAR
  Filled 2018-06-21: qty 0.5

## 2018-06-21 MED ORDER — PENTAFLUOROPROP-TETRAFLUOROETH EX AERO
INHALATION_SPRAY | CUTANEOUS | Status: DC | PRN
  Filled 2018-06-21: qty 30

## 2018-06-21 MED ORDER — ACETAMINOPHEN 500 MG PO TABS
1000.0000 mg | ORAL_TABLET | ORAL | Status: AC
  Administered 2018-06-21: 1000 mg via ORAL
  Filled 2018-06-21: qty 2
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
## 2018-06-21 NOTE — ED Triage Notes (Signed)
Pt arrived via [facility] from home, [place] her [place]. Nose and lip are swollen. Pt is on eliquis, no LOC. Pt is c/o of pain in left knee, swelling noted. Hx of afib and HTN.

## 2018-06-21 NOTE — ED Notes (Signed)
ACE wrap applied to right knee, pt tolerated well. Pt also ambulated with minimal assistance and did well.

## 2018-06-27 ENCOUNTER — Encounter: Payer: Self-pay | Admitting: Nurse Practitioner

## 2018-06-27 ENCOUNTER — Other Ambulatory Visit: Payer: Self-pay

## 2018-06-27 ENCOUNTER — Ambulatory Visit (INDEPENDENT_AMBULATORY_CARE_PROVIDER_SITE_OTHER): Payer: Medicare Other | Admitting: Nurse Practitioner

## 2018-06-27 DIAGNOSIS — Z9181 History of falling: Secondary | ICD-10-CM | POA: Diagnosis not present

## 2018-06-27 DIAGNOSIS — L03116 Cellulitis of left lower limb: Secondary | ICD-10-CM

## 2018-06-27 MED ORDER — DOXYCYCLINE HYCLATE 100 MG PO TABS: 100.0000 mg | ORAL_TABLET | Freq: Two times a day (BID) | ORAL | 0 refills | Status: DC

## 2018-06-27 NOTE — Assessment & Plan Note (Addendum)
Multiple areas of bruising.  Reports discomfort to left knee.  Recommend RICE and return on Monday.  No stiches to lip out today as crusting covering area and unable to view stiches well at this time.  Refer to cellulitis POC.

## 2018-06-27 NOTE — Patient Instructions (Signed)

## 2018-06-27 NOTE — Assessment & Plan Note (Signed)
Acute with significant bruising, warmth, erythema, edema.  Script for Doxycycline sent.  Recommend continue RICE at home with ice to area every 2 hours for 30 minutes.  Monitor temperature at home.  Hold Apixaban over weekend due to significant bruising to area with swelling.  Immediately go to ER if over weekend temperature >100 or increased pain/swelling to joint.  Return on Monday for follow-up.

## 2018-06-27 NOTE — Progress Notes (Signed)
BP 128/79   Pulse 68   Temp 97.6 F (36.4 C) (Oral)   Ht 4\' 11"  (1.499 m)   Wt 175 lb (79.4 kg)   LMP  (LMP Unknown)   SpO2 98%   BMI 35.35 kg/m    Subjective:    Patient ID: Amy Myers, female    DOB: May 14, 1945, 73 y.o.   MRN: [id number]  HPI: Amy Myers is a 73 y.o. female  Chief Complaint  Patient presents with  . Hospitalization Follow-up    Patient presents after a mechanical fall.  She fell forward striking her face on the pavement without loss of consciousness.  She is neurologically intact and denies any preceding or later cardiac pulmonary or neurologic symptoms.  She is well-appearing but obviously has injury to the front of the face especially the anterior with a laceration and slight edema involving the upper lip.  This was repaired with excellent effect.  She does not demonstrate dental injury.  She has no signs or symptoms of neck  . Fall      She is fully alert and oriented without distracting injury. CT of the head is performed as she is on anticoagulant and thankfully this does not show any bleeding.  Additionally on careful inspection she also has anterior swelling and a mild sized I suspect hematoma involving the left anterior knee soft tissues.  Negative for fracture.   ER FOLLOW UP Seen in ER 06/21/2018 after fall.  [place] [employer] and tripped in parking lot.  [address] forward striking her face on the pavement.  No loss of consciousness.  Did have left nasal bone fracture on imaging, reports they recommended letting this "heal on itself".  She reports two stiches placed to lip.  Fell on left knee during fall.  States her left leg is more swollen now, reports swelling to this leg increased on Wednesday.  She endorses resting and icing area.  Reports pain to front of left knee, pain 6/10.  Taking Tylenol at home, is not helping much.  Icing three times a day.  Walking on it makes it worse, resting makes it better.  Is on Apixaban. Time since discharge:  06/21/2018 Hospital/facility: [facility] Diagnosis: CT Head and maxiofacial + knee imaging Procedures/tests: Imaging Consultants: none New medications: none, gave Tetanus shot Discharge instructions:  Follow-up with PCP Status: stable  IMAGING OBTAINED IN ER AND RESULTS:  Dg Knee 2 Views Left  FINDINGS: No acute fracture or dislocation. No definite joint effusion. Suboptimal patient positioning for evaluation of such. Suspect prepatellar soft tissue swelling. IMPRESSION: No acute osseous abnormality. Electronically Signed   By: Abigail Miyamoto M.D.   On: 06/21/2018 12:32   Ct Head Wo Contrast  FINDINGS: CT HEAD FINDINGS Brain: No evidence for acute hemorrhage, mass lesion, midline shift, hydrocephalus or large infarct. Chronic low-density in the subcortical white matter. Vascular: No hyperdense vessel or unexpected calcification. Skull: Normal. Negative for fracture or focal lesion. Other: None. CT MAXILLOFACIAL FINDINGS Osseous: Mild depression of the left nasal bone but not significantly changed from the previous examination. Difficult to exclude a subtle fracture in this area due to the surrounding soft tissue edema. Nasal septum is intact. No other facial bone fractures. Degenerative changes at the left mandibular condyle and left TMJ. Visualized cervical spine is intact. Orbits: Negative. No traumatic or inflammatory finding. Sinuses: Paranasal sinuses are clear.  Mastoid air cells are clear. Soft tissues: Again noted is a skin and subcutaneous nodule along  the right side of the face. This skin/soft tissue nodule measures roughly 9 mm in depth. There is soft tissue edema and gas in the nose, particularly along the left side. Findings are compatible with a laceration. Small densities in the parotid tissue are unchanged and probably represent small intraparotid lymph nodes.   IMPRESSION: 1. No acute intracranial abnormality. 2. Soft tissue swelling and soft tissue gas at the nose compatible with  laceration and soft tissue injury. Mild depression and deformity of the left nasal bone is compatible with a fracture but not clear if this is acute or chronic based on the prior examination. Burtis Junes an acute injury based on the adjacent soft tissue abnormality. No other facial bone fractures. 3. Prominent skin/subcutaneous nodule on the right side of the face. Recommend clinical correlation in this area. Electronically Signed   By: Markus Daft M.D.   On: 06/21/2018 13:11   Ct Maxillofacial Wo Contrast FINDINGS Osseous: Mild depression of the left nasal bone but not significantly changed from the previous examination. Difficult to exclude a subtle fracture in this area due to the surrounding soft tissue edema. Nasal septum is intact. No other facial bone fractures. Degenerative changes at the left mandibular condyle and left TMJ. Visualized cervical spine is intact. Orbits: Negative. No traumatic or inflammatory finding. Sinuses: Paranasal sinuses are clear.  Mastoid air cells are clear. Soft tissues: Again noted is a skin and subcutaneous nodule along the right side of the face. This skin/soft tissue nodule measures roughly 9 mm in depth. There is soft tissue edema and gas in the nose, particularly along the left side. Findings are compatible with a laceration. Small densities in the parotid tissue are unchanged and probably represent small intraparotid lymph nodes.   IMPRESSION: 1. No acute intracranial abnormality. 2. Soft tissue swelling and soft tissue gas at the nose compatible with laceration and soft tissue injury. Mild depression and deformity of the left nasal bone is compatible with a fracture but not clear if this is acute or chronic based on the prior examination. Burtis Junes an acute injury based on the adjacent soft tissue abnormality. No other facial bone fractures. 3. Prominent skin/subcutaneous nodule on the right side of the face. Recommend clinical correlation in this area. Electronically Signed    By: Markus Daft M.D.   On: 06/21/2018 13:11    Relevant past medical, surgical, family and social history reviewed and updated as indicated. Interim medical history since our last visit reviewed. Allergies and medications reviewed and updated.  Review of Systems  Constitutional: Negative for activity change, appetite change, diaphoresis, fatigue and fever.  Respiratory: Negative for cough, chest tightness and shortness of breath.   Cardiovascular: Negative for chest pain, palpitations and leg swelling.  Gastrointestinal: Negative for abdominal distention, abdominal pain, constipation, diarrhea, nausea and vomiting.  Endocrine: Negative for cold intolerance, heat intolerance, polydipsia, polyphagia and polyuria.  Musculoskeletal: Positive for joint swelling.  Neurological: Negative for dizziness, syncope, weakness, light-headedness, numbness and headaches.  Psychiatric/Behavioral: Negative.     Per HPI unless specifically indicated above     Objective:    BP 128/79   Pulse 68   Temp 97.6 F (36.4 C) (Oral)   Ht 4\' 11"  (1.499 m)   Wt 175 lb (79.4 kg)   LMP  (LMP Unknown)   SpO2 98%   BMI 35.35 kg/m   Wt Readings from Last 3 Encounters:  06/27/18 175 lb (79.4 kg)  06/21/18 187 lb 6.3 oz (85 kg)  05/01/18 175 lb  6.4 oz (79.6 kg)    Physical Exam Vitals signs and nursing note reviewed.  Constitutional:      Appearance: She is well-developed.  HENT:     Head: Normocephalic.  Eyes:     General:        Right eye: No discharge.        Left eye: No discharge.     Conjunctiva/sclera: Conjunctivae normal.     Pupils: Pupils are equal, round, and reactive to light.  Neck:     Musculoskeletal: Normal range of motion and neck supple.     Thyroid: No thyromegaly.     Vascular: No carotid bruit or JVD.  Cardiovascular:     Rate and Rhythm: Normal rate and regular rhythm.     Pulses:          Popliteal pulses are 1+ on the right side and 1+ on the left side.       Dorsalis pedis  pulses are 2+ on the right side and 2+ on the left side.       Posterior tibial pulses are 1+ on the right side and 1+ on the left side.     Heart sounds: Normal heart sounds.  Pulmonary:     Effort: Pulmonary effort is normal.     Breath sounds: Normal breath sounds.  Abdominal:     General: Bowel sounds are normal.     Palpations: Abdomen is soft.  Musculoskeletal:     Right knee: She exhibits normal range of motion, no swelling, no effusion, no laceration and no erythema.     Left knee: She exhibits decreased range of motion, swelling, ecchymosis and erythema.     Right lower leg: Edema (1+) present.     Left lower leg: Edema (3+) present.     Comments: Pale yellow/purple bruising to right anterior lower extremity with minimal swelling.  No warmth or erythema to RLE.  Left lower extremity with significant (3+) swelling and redness/warmth to anterior knee (she reports scrape to area during fall).  Bruising and swelling to left leg extends from medial/dorsal aspect left foot - lower anterior/posterior calf to upper posterior thigh.  Negative Homan's bilaterally.  Lymphadenopathy:     Cervical: No cervical adenopathy.  Skin:    General: Skin is warm and dry.     Comments: Deep purple bruising under right breast extending from mid to outer left side.  Small, approx 4 cm, bruise under left eye and small, approx 1 cm, under right eye.  Stitches with crusting surrounding and over area to mid upper lip.  Bruise to tip of mid-nose.  Small crusting to palmar right hand and small crusting left elbow.    Neurological:     Mental Status: She is alert and oriented to person, place, and time.  Psychiatric:        Behavior: Behavior normal.        Thought Content: Thought content normal.        Judgment: Judgment normal.     Results for orders placed or performed in visit on 05/01/18  Thyroid Panel With TSH  Result Value Ref Range   TSH 0.921 0.450 - 4.500 uIU/mL   T4, Total 11.5 4.5 - 12.0 ug/dL    T3 Uptake Ratio 30 24 - 39 %   Free Thyroxine Index 3.5 1.2 - 4.9      Assessment & Plan:   Problem List Items Addressed This Visit      Other   Status  post fall    Multiple areas of bruising.  Reports discomfort to left knee.  Recommend RICE and return on Monday.  No stiches to lip out today as crusting covering area and unable to view stiches well at this time.  Refer to cellulitis POC.      Cellulitis of left knee    Acute with significant bruising, warmth, erythema, edema.  Script for Doxycycline sent.  Recommend continue RICE at home with ice to area every 2 hours for 30 minutes.  Monitor temperature at home.  Hold Apixaban over weekend due to significant bruising to area with swelling.  Immediately go to ER if over weekend temperature >100 or increased pain/swelling to joint.  Return on Monday for follow-up.         Time: 25 minutes, >50% spent counseling on cellulitis and instructions for care  Follow up plan: Return in about 3 days (around 06/30/2018) for Follow-up injuries.

## 2018-06-30 ENCOUNTER — Inpatient Hospital Stay: Payer: Medicare Other | Admitting: Nurse Practitioner

## 2018-06-30 ENCOUNTER — Other Ambulatory Visit: Payer: Self-pay

## 2018-06-30 ENCOUNTER — Ambulatory Visit (INDEPENDENT_AMBULATORY_CARE_PROVIDER_SITE_OTHER): Payer: Medicare Other | Admitting: Nurse Practitioner

## 2018-06-30 ENCOUNTER — Encounter: Payer: Self-pay | Admitting: Nurse Practitioner

## 2018-06-30 DIAGNOSIS — L03116 Cellulitis of left lower limb: Secondary | ICD-10-CM

## 2018-06-30 NOTE — Assessment & Plan Note (Signed)
Some improvement noted on exam today.  Continue Doxycycline.  Taught how to draw around area with marker to monitor erythema.  Continue to monitor temps at home and continue to utilize RICE, with ice to area every 2 hours for 30 minutes + elevation.  Return in 3 days.

## 2018-06-30 NOTE — Patient Instructions (Signed)

## 2018-06-30 NOTE — Progress Notes (Signed)
BP 125/82   Pulse 86 Comment: apical  Temp (!) 97.4 F (36.3 C) (Oral)   Ht 4\' 11"  (1.499 m)   Wt 180 lb 9.6 oz (81.9 kg)   LMP  (LMP Unknown)   SpO2 98%   BMI 36.48 kg/m    Subjective:    Patient ID: Amy Myers, female    DOB: 04/10/1946, 73 y.o.   MRN: [id number]  HPI: Amy Myers is a 73 y.o. female  Chief Complaint  Patient presents with  . Fall    f/u   CELLULITIS LEFT KNEE: Had fall on 06/21/2018 with fracture to nose and multiple areas of bruising (on Apixaban).  Was seen 06/27/18 for hospital follow-up and noted to have significant swelling with erythema and warmth to left knee + significant bruising to LLE.  Started on Doxycycline and instructed to hold Apixaban over weekend + RICE extremity over weekend.  Returns today for follow-up.  Reports continued discomfort to left knee.  Has been elevating and resting it.  Does endorse bruising is better. States she feels swelling in feet is worse.  Denies SOB, CP, or headaches.  Reports taking Tylenol at homes as instructed.    Relevant past medical, surgical, family and social history reviewed and updated as indicated. Interim medical history since our last visit reviewed. Allergies and medications reviewed and updated.  Review of Systems  Constitutional: Negative for activity change, appetite change, diaphoresis, fatigue and fever.  Respiratory: Negative for cough, chest tightness and shortness of breath.   Cardiovascular: Negative for chest pain, palpitations and leg swelling.  Gastrointestinal: Negative for abdominal distention, abdominal pain, constipation, diarrhea, nausea and vomiting.  Endocrine: Negative for cold intolerance, heat intolerance, polydipsia, polyphagia and polyuria.  Musculoskeletal: Positive for joint swelling.  Neurological: Negative for dizziness, syncope, weakness, light-headedness, numbness and headaches.  Psychiatric/Behavioral: Negative.     Per HPI unless specifically indicated above    Objective:    BP 125/82   Pulse 86 Comment: apical  Temp (!) 97.4 F (36.3 C) (Oral)   Ht 4\' 11"  (1.499 m)   Wt 180 lb 9.6 oz (81.9 kg)   LMP  (LMP Unknown)   SpO2 98%   BMI 36.48 kg/m   Wt Readings from Last 3 Encounters:  06/30/18 180 lb 9.6 oz (81.9 kg)  06/27/18 175 lb (79.4 kg)  06/21/18 187 lb 6.3 oz (85 kg)    Physical Exam Vitals signs and nursing note reviewed.  Constitutional:      Appearance: She is well-developed.  HENT:     Head: Normocephalic.  Eyes:     General:        Right eye: No discharge.        Left eye: No discharge.     Conjunctiva/sclera: Conjunctivae normal.     Pupils: Pupils are equal, round, and reactive to light.  Neck:     Musculoskeletal: Normal range of motion and neck supple.     Thyroid: No thyromegaly.     Vascular: No carotid bruit or JVD.  Cardiovascular:     Rate and Rhythm: Normal rate and regular rhythm.     Heart sounds: Normal heart sounds.  Pulmonary:     Effort: Pulmonary effort is normal.     Breath sounds: Normal breath sounds.  Abdominal:     General: Bowel sounds are normal.     Palpations: Abdomen is soft.  Musculoskeletal:     Right knee: She exhibits normal range of motion, no swelling and  no erythema. No tenderness found.     Left knee: She exhibits decreased range of motion, swelling and erythema. Tenderness found.     Comments: Pale yellow/purple bruising to right anterior lower extremity (slightly improved from previous exam).  No warmth or erythema to RLE.  Left lower extremity with significant (2+) swelling and redness/warmth to anterior knee (both which have improved on exam today).  Bruising and swelling to left leg extends from medial/dorsal aspect left foot - lower anterior/posterior calf to upper posterior thigh (bruising has improved on exam today, less behind thigh and more noted to foot and calf).  Negative Homan's bilaterally.  Right Calf 15in and left 16 1/2in.  Overall less swelling and erythema/warmth  on exam today.  Lymphadenopathy:     Cervical: No cervical adenopathy.  Skin:    General: Skin is warm and dry.     Comments: Crusting to upper, mid lip remains.  Smaller in size.  Unable to view sutures d/t crusting.    Neurological:     Mental Status: She is alert and oriented to person, place, and time.  Psychiatric:        Mood and Affect: Mood normal.        Behavior: Behavior normal.        Thought Content: Thought content normal.        Judgment: Judgment normal.     Results for orders placed or performed in visit on 05/01/18  Thyroid Panel With TSH  Result Value Ref Range   TSH 0.921 0.450 - 4.500 uIU/mL   T4, Total 11.5 4.5 - 12.0 ug/dL   T3 Uptake Ratio 30 24 - 39 %   Free Thyroxine Index 3.5 1.2 - 4.9      Assessment & Plan:   Problem List Items Addressed This Visit      Other   Cellulitis of left knee    Some improvement noted on exam today.  Continue Doxycycline.  Taught how to draw around area with marker to monitor erythema.  Continue to monitor temps at home and continue to utilize RICE, with ice to area every 2 hours for 30 minutes + elevation.  Return in 3 days.          Follow up plan: Return in about 3 days (around 07/03/2018) for cellulitis.

## 2018-07-03 ENCOUNTER — Ambulatory Visit (INDEPENDENT_AMBULATORY_CARE_PROVIDER_SITE_OTHER): Payer: Medicare Other | Admitting: Nurse Practitioner

## 2018-07-03 ENCOUNTER — Encounter: Payer: Self-pay | Admitting: Nurse Practitioner

## 2018-07-03 ENCOUNTER — Other Ambulatory Visit: Payer: Self-pay

## 2018-07-03 VITALS — BP 105/67 | HR 93 | Temp 97.9°F

## 2018-07-03 DIAGNOSIS — L03116 Cellulitis of left lower limb: Secondary | ICD-10-CM | POA: Diagnosis not present

## 2018-07-03 NOTE — Patient Instructions (Signed)

## 2018-07-03 NOTE — Assessment & Plan Note (Signed)
Continued improvement noted.  Continue Doxycycline.  Recommend continue RICE at home and to mark area + monitor temps at home.  Return on Monday.  If any fever or worsening discomfort to extremity to immediately present to ER.  Will obtain CBC, CMP, and uric acid today (patient has underlying chronic gout).

## 2018-07-03 NOTE — Progress Notes (Signed)
BP 105/67    Pulse 93    Temp 97.9 F (36.6 C) (Oral)    LMP  (LMP Unknown)    SpO2 100%    Subjective:    Patient ID: Amy Myers, female    DOB: 02-03-1946, 73 y.o.   MRN: [id number]  HPI: SAMEENA ARTUS is a 73 y.o. female  Chief Complaint  Patient presents with   Cellulitis    3 day f/up    CELLULITIS LEFT KNEE: Had fall on 06/21/2018 with fracture to nose and multiple areas of bruising to body (takes Apixaban) + stitches to lip. Was seen 06/27/18 for hospital follow-up and noted to have significant swelling with erythema and warmth to left knee + significant bruising to LLE.  Started on Doxycycline and instructed to hold Apixaban over weekend + RICE extremity over weekend.  Returns today for 3 day follow-up, at last 3 day follow-up area had been improving.  Reports continued discomfort to left knee, but states redness is less and is currently walking with cane vs walker as previously was.  Has been elevating and resting it as instructed.  Does endorse bruising is "some" better. States she feels swelling in foot is not better.  Denies SOB, CP, or headaches.  Reports taking Tylenol at homes as instructed.  Has been checking temps at home and no fever, temps 98 range at home.  Relevant past medical, surgical, family and social history reviewed and updated as indicated. Interim medical history since our last visit reviewed. Allergies and medications reviewed and updated.  Review of Systems  Constitutional: Negative for activity change, appetite change, diaphoresis, fatigue and fever.  Respiratory: Negative for cough, chest tightness and shortness of breath.   Cardiovascular: Negative for chest pain, palpitations and leg swelling.  Gastrointestinal: Negative for abdominal distention, abdominal pain, constipation, diarrhea, nausea and vomiting.  Endocrine: Negative for cold intolerance, heat intolerance, polydipsia, polyphagia and polyuria.  Musculoskeletal: Positive for arthralgias.    Neurological: Negative for dizziness, syncope, weakness, light-headedness, numbness and headaches.  Psychiatric/Behavioral: Negative.     Per HPI unless specifically indicated above     Objective:    BP 105/67    Pulse 93    Temp 97.9 F (36.6 C) (Oral)    LMP  (LMP Unknown)    SpO2 100%   Wt Readings from Last 3 Encounters:  06/30/18 180 lb 9.6 oz (81.9 kg)  06/27/18 175 lb (79.4 kg)  06/21/18 187 lb 6.3 oz (85 kg)    Physical Exam Vitals signs and nursing note reviewed.  Constitutional:      Appearance: She is well-developed.  HENT:     Head: Normocephalic.  Eyes:     General:        Right eye: No discharge.        Left eye: No discharge.     Conjunctiva/sclera: Conjunctivae normal.     Pupils: Pupils are equal, round, and reactive to light.  Neck:     Musculoskeletal: Normal range of motion and neck supple.     Thyroid: No thyromegaly.     Vascular: No carotid bruit or JVD.  Cardiovascular:     Rate and Rhythm: Normal rate and regular rhythm.     Heart sounds: Normal heart sounds. No murmur. No gallop.   Pulmonary:     Effort: Pulmonary effort is normal.     Breath sounds: Normal breath sounds.  Abdominal:     General: Bowel sounds are normal.  Palpations: Abdomen is soft.  Musculoskeletal:     Right knee: She exhibits normal range of motion, no swelling, no ecchymosis and no erythema. No tenderness found.     Left knee: She exhibits decreased range of motion, swelling and erythema. She exhibits no laceration. Tenderness found.     Right lower leg: No edema.     Left lower leg: Edema (1+) present.     Comments: Pale yellow bruising to right anterior lower extremity (improved from previous exam).  No warmth or erythema to RLE.  Left lower extremity with significant (1+) swelling and redness/warmth to anterior knee (erythema has decreased and is moving inward, area marked previous visit and less erythema marked today).  Bruising and swelling to left leg extends  from medial/dorsal aspect left foot - lower anterior/posterior calf to upper posterior thigh (bruising has improved on exam today, less behind thigh and continued same amount noted to foot and calf).  Negative Homan's bilaterally.  Right Calf 15in and left 16 1/2in.  Overall less swelling and erythema/warmth on exam today.   Lymphadenopathy:     Cervical: No cervical adenopathy.  Skin:    General: Skin is warm and dry.     Comments: Crusting to upper, mid lip gently removed and x 2 sutures removed from area with no drainage, erythema, or warmth.  Lip intact.  Neurological:     Mental Status: She is alert and oriented to person, place, and time.  Psychiatric:        Mood and Affect: Mood normal.        Behavior: Behavior normal.        Thought Content: Thought content normal.        Judgment: Judgment normal.     Results for orders placed or performed in visit on 05/01/18  Thyroid Panel With TSH  Result Value Ref Range   TSH 0.921 0.450 - 4.500 uIU/mL   T4, Total 11.5 4.5 - 12.0 ug/dL   T3 Uptake Ratio 30 24 - 39 %   Free Thyroxine Index 3.5 1.2 - 4.9      Assessment & Plan:   Problem List Items Addressed This Visit      Other   Cellulitis of left knee - Primary    Continued improvement noted.  Continue Doxycycline.  Recommend continue RICE at home and to mark area + monitor temps at home.  Return on Monday.  If any fever or worsening discomfort to extremity to immediately present to ER.  Will obtain CBC, CMP, and uric acid today (patient has underlying chronic gout).      Relevant Orders   CBC with Differential/Platelet   Basic Metabolic Panel (BMET)   Uric acid       Follow up plan: Return in about 4 days (around 07/07/2018) for Cellulitis follow-up.

## 2018-07-04 ENCOUNTER — Telehealth: Payer: Self-pay | Admitting: Nurse Practitioner

## 2018-07-04 LAB — CBC WITH DIFFERENTIAL/PLATELET
Basophils Absolute: 0.1 10*3/uL (ref 0.0–0.2)
Basos: 1 %
EOS (ABSOLUTE): 0.2 10*3/uL (ref 0.0–0.4)
Eos: 2 %
Hematocrit: 31.1 % — ABNORMAL LOW (ref 34.0–46.6)
Hemoglobin: 10.1 g/dL — ABNORMAL LOW (ref 11.1–15.9)
Immature Grans (Abs): 0 10*3/uL (ref 0.0–0.1)
Immature Granulocytes: 0 %
Lymphocytes Absolute: 1.1 10*3/uL (ref 0.7–3.1)
Lymphs: 16 %
MCH: 30 pg (ref 26.6–33.0)
MCHC: 32.5 g/dL (ref 31.5–35.7)
MCV: 92 fL (ref 79–97)
Monocytes Absolute: 0.5 10*3/uL (ref 0.1–0.9)
Monocytes: 7 %
Neutrophils Absolute: 5.1 10*3/uL (ref 1.4–7.0)
Neutrophils: 74 %
Platelets: 209 10*3/uL (ref 150–450)
RBC: 3.37 x10E6/uL — ABNORMAL LOW (ref 3.77–5.28)
RDW: 13.6 % (ref 11.7–15.4)
WBC: 6.9 10*3/uL (ref 3.4–10.8)

## 2018-07-04 LAB — BASIC METABOLIC PANEL
BUN/Creatinine Ratio: 17 (ref 12–28)
BUN: 25 mg/dL (ref 8–27)
CO2: 20 mmol/L (ref 20–29)
Calcium: 9.3 mg/dL (ref 8.7–10.3)
Chloride: 104 mmol/L (ref 96–106)
Creatinine, Ser: 1.48 mg/dL — ABNORMAL HIGH (ref 0.57–1.00)
GFR calc Af Amer: 40 mL/min/{1.73_m2} — ABNORMAL LOW (ref >59)
GFR calc non Af Amer: 35 mL/min/{1.73_m2} — ABNORMAL LOW (ref >59)
Glucose: 93 mg/dL (ref 65–99)
Potassium: 4.3 mmol/L (ref 3.5–5.2)
Sodium: 140 mmol/L (ref 134–144)

## 2018-07-04 LAB — URIC ACID: Uric Acid: 4.1 mg/dL (ref 2.5–7.1)

## 2018-07-04 NOTE — Telephone Encounter (Signed)
Spoke to Amy Myers via telephone to review lab results.  Her cellulitis of left knee is improving on exams and continues to have some discomfort, but overall improving.  Denies blood in stool, bleeding episodes, or abdominal pain.  Continues to have some bruising to LLE and is checking temperatures at home which remain in 98 range.  She has some anemia noted on labs from yesterday.  Discussed with her.  She reports some fatigue d/t recent fall, but no cold intolerance, SOB, or CP.  Have discussed with her holding Apixaban over weekend to help with continued mild inflammation and infection to bruise on left knee and bruising to LLE.  She will hold this over weekend and we will recheck labs on Monday at her follow-up.  She is aware to report to ER immediately if any changes: increase pain, bleeding, SOB, CP.

## 2018-07-07 ENCOUNTER — Encounter: Payer: Self-pay | Admitting: Nurse Practitioner

## 2018-07-07 ENCOUNTER — Other Ambulatory Visit: Payer: Self-pay

## 2018-07-07 ENCOUNTER — Ambulatory Visit (INDEPENDENT_AMBULATORY_CARE_PROVIDER_SITE_OTHER): Payer: Medicare Other | Admitting: Nurse Practitioner

## 2018-07-07 VITALS — BP 120/81 | HR 96 | Temp 97.6°F | Ht 59.0 in | Wt 178.0 lb

## 2018-07-07 DIAGNOSIS — L03116 Cellulitis of left lower limb: Secondary | ICD-10-CM | POA: Diagnosis not present

## 2018-07-07 MED ORDER — DOXYCYCLINE HYCLATE 100 MG PO TABS: 100.0000 mg | ORAL_TABLET | Freq: Two times a day (BID) | ORAL | 0 refills | Status: AC

## 2018-07-07 NOTE — Patient Instructions (Signed)

## 2018-07-07 NOTE — Assessment & Plan Note (Signed)
Continue improvement noted with reduction in erythema and warmth.  Will extend Doxycycline x 7 days for total of 21 days treatment.  Recommend continued RICE at home and to mark area + monitor temps.  Return on Friday for follow-up.  If any fever or worsening discomfort to extremity discomfort to immediately present to ER.  Repeat CBC and BMP today, along with anemia panel.

## 2018-07-07 NOTE — Progress Notes (Signed)
BP 120/81   Pulse 96   Temp 97.6 F (36.4 C) (Oral)   Ht 4\' 11"  (1.499 m)   Wt 178 lb (80.7 kg)   LMP  (LMP Unknown)   SpO2 99%   BMI 35.95 kg/m    Subjective:    Patient ID: Amy Myers, female    DOB: [birth year], 73 y.o.   MRN: [id number]  HPI: Amy Myers is a 73 y.o. female  Chief Complaint  Patient presents with  . Cellulitis    f/u    CELLULITIS LEFT KNEE (UNDER AREA OF BRUISING): Fall took place on 06/21/2018 with fracture to nose and multiple areas of bruising to body (is on Apixaban) + stitches to lip which were removed on 07/04/18. Was seen 06/27/18 for hospital follow-up and noted to have significant swelling with erythema and warmth to left knee + significant bruising to LLE. Started on Doxycycline and instructed to perform RICE to extremity frequently, with gradual improvement noted at follow-up visits. Returns today for follow-up, at last 3 day follow-up area had been improving. Reports continued discomfort to left knee, but states this is improving as is redness and is currently walking more with cane vs walker as previously was. Has been elevating and resting it as instructed. Does endorse bruising is "a bit" better, but endorses frustration over not being where she was prior to fall yet. States she feels swelling in foot is getting "a little better". Denies SOB, CP, or headaches. Reports taking Tylenol at homes as instructed. Has been checking temps at home and no fever, temps 97 to 98 range at home.  Recent labs noted some decrease in H/H 10.1/31.1, MCV 92, PLT 209.  Prior HGB 13 range.  Kidney function on recent labs with some decline noted CRT 1.48 and GFR 35, previous 5 months ago 1.28 and 42.  She reports she has increased fluid intake at home over weekend.  Her last Doxycycline dose is on Thursday, discussed extending this 7 more days.   Relevant past medical, surgical, family and social history reviewed and updated as indicated. Interim medical history  since our last visit reviewed.  Allergies and medications reviewed and updated.  Review of Systems  Constitutional: Negative for activity change, appetite change, diaphoresis, fatigue and fever.  Respiratory: Negative for cough, chest tightness and shortness of breath.   Cardiovascular: Negative for chest pain, palpitations and leg swelling.  Gastrointestinal: Negative for abdominal distention, abdominal pain, constipation, diarrhea, nausea and vomiting.  Endocrine: Negative for cold intolerance, heat intolerance, polydipsia, polyphagia and polyuria.  Musculoskeletal: Positive for joint swelling. Negative for arthralgias.  Neurological: Negative for dizziness, syncope, weakness, light-headedness, numbness and headaches.  Psychiatric/Behavioral: Negative.     Per HPI unless specifically indicated above     Objective:    BP 120/81   Pulse 96   Temp 97.6 F (36.4 C) (Oral)   Ht 4\' 11"  (1.499 m)   Wt 178 lb (80.7 kg)   LMP  (LMP Unknown)   SpO2 99%   BMI 35.95 kg/m   Wt Readings from Last 3 Encounters:  07/07/18 178 lb (80.7 kg)  06/30/18 180 lb 9.6 oz (81.9 kg)  06/27/18 175 lb (79.4 kg)    Physical Exam Vitals signs and nursing note reviewed.  Constitutional:      Appearance: She is well-developed.  HENT:     Head: Normocephalic.  Eyes:     General:        Right eye: No discharge.  Left eye: No discharge.     Conjunctiva/sclera: Conjunctivae normal.     Pupils: Pupils are equal, round, and reactive to light.  Neck:     Musculoskeletal: Normal range of motion and neck supple.     Thyroid: No thyromegaly.     Vascular: No carotid bruit or JVD.  Cardiovascular:     Rate and Rhythm: Normal rate and regular rhythm.     Heart sounds: Normal heart sounds.  Pulmonary:     Effort: Pulmonary effort is normal.     Breath sounds: Normal breath sounds.  Abdominal:     General: Bowel sounds are normal.     Palpations: Abdomen is soft.  Musculoskeletal:     Right  knee: She exhibits normal range of motion, no swelling, no effusion, no laceration and no erythema. No tenderness found.     Left knee: She exhibits decreased range of motion, swelling, ecchymosis and erythema. She exhibits no laceration. Tenderness found.     Comments: Scattered pale yellow bruising to right anterior lower extremity (improved from previous exam).  No warmth or erythema to RLE.  Left lower extremity with (1+) swelling and pale purple bruising + mild erythema and warmth to anterior knee (erythema has decreased and is moving inward, area marked previous visit and less erythema marked today).  Bruising and swelling to left leg extends from medial/dorsal aspect left foot - lower anterior/posterior calf to upper posterior thigh (bruising has improved on exam today, less behind thigh and foot and calf).  Negative Homan's bilaterally.  Right Calf 15 in and left 16 in (decrease in size from previous visit).    Lymphadenopathy:     Cervical: No cervical adenopathy.  Skin:    General: Skin is warm and dry.  Neurological:     Mental Status: She is alert and oriented to person, place, and time.  Psychiatric:        Behavior: Behavior normal.        Thought Content: Thought content normal.        Judgment: Judgment normal.     Results for orders placed or performed in visit on 07/03/18  CBC with Differential/Platelet  Result Value Ref Range   WBC 6.9 3.4 - 10.8 x10E3/uL   RBC 3.37 (L) 3.77 - 5.28 x10E6/uL   Hemoglobin 10.1 (L) 11.1 - 15.9 g/dL   Hematocrit 31.1 (L) 34.0 - 46.6 %   MCV 92 79 - 97 fL   MCH 30.0 26.6 - 33.0 pg   MCHC 32.5 31.5 - 35.7 g/dL   RDW 13.6 11.7 - 15.4 %   Platelets 209 150 - 450 x10E3/uL   Neutrophils 74 Not Estab. %   Lymphs 16 Not Estab. %   Monocytes 7 Not Estab. %   Eos 2 Not Estab. %   Basos 1 Not Estab. %   Neutrophils Absolute 5.1 1.4 - 7.0 x10E3/uL   Lymphocytes Absolute 1.1 0.7 - 3.1 x10E3/uL   Monocytes Absolute 0.5 0.1 - 0.9 x10E3/uL   EOS  (ABSOLUTE) 0.2 0.0 - 0.4 x10E3/uL   Basophils Absolute 0.1 0.0 - 0.2 x10E3/uL   Immature Granulocytes 0 Not Estab. %   Immature Grans (Abs) 0.0 0.0 - 0.1 W26V7/CH  Basic Metabolic Panel (BMET)  Result Value Ref Range   Glucose 93 65 - 99 mg/dL   BUN 25 8 - 27 mg/dL   Creatinine, Ser 1.48 (H) 0.57 - 1.00 mg/dL   GFR calc non Af Amer 35 (L) >59 mL/min/1.73  GFR calc Af Amer 40 (L) >59 mL/min/1.73   BUN/Creatinine Ratio 17 12 - 28   Sodium 140 134 - 144 mmol/L   Potassium 4.3 3.5 - 5.2 mmol/L   Chloride 104 96 - 106 mmol/L   CO2 20 20 - 29 mmol/L   Calcium 9.3 8.7 - 10.3 mg/dL  Uric acid  Result Value Ref Range   Uric Acid 4.1 2.5 - 7.1 mg/dL      Assessment & Plan:   Problem List Items Addressed This Visit      Other   Cellulitis of left knee - Primary    Continue improvement noted with reduction in erythema and warmth.  Will extend Doxycycline x 7 days for total of 21 days treatment.  Recommend continued RICE at home and to mark area + monitor temps.  Return on Friday for follow-up.  If any fever or worsening discomfort to extremity discomfort to immediately present to ER.  Repeat CBC and BMP today, along with anemia panel.        Relevant Orders   CBC with Differential/Platelet   Anemia panel   Basic Metabolic Panel (BMET)       Follow up plan: Return in about 4 days (around 07/11/2018) for cellulitis.

## 2018-07-08 LAB — CBC WITH DIFFERENTIAL/PLATELET
Basophils Absolute: 0.1 10*3/uL (ref 0.0–0.2)
Basos: 1 %
EOS (ABSOLUTE): 0.2 10*3/uL (ref 0.0–0.4)
Eos: 3 %
Hemoglobin: 10.5 g/dL — ABNORMAL LOW (ref 11.1–15.9)
Immature Grans (Abs): 0 10*3/uL (ref 0.0–0.1)
Immature Granulocytes: 0 %
Lymphocytes Absolute: 1.2 10*3/uL (ref 0.7–3.1)
Lymphs: 19 %
MCH: 30.5 pg (ref 26.6–33.0)
MCHC: 33.9 g/dL (ref 31.5–35.7)
MCV: 90 fL (ref 79–97)
Monocytes Absolute: 0.4 10*3/uL (ref 0.1–0.9)
Monocytes: 7 %
Neutrophils Absolute: 4.4 10*3/uL (ref 1.4–7.0)
Neutrophils: 70 %
Platelets: 200 10*3/uL (ref 150–450)
RBC: 3.44 x10E6/uL — ABNORMAL LOW (ref 3.77–5.28)
RDW: 13.5 % (ref 11.7–15.4)
WBC: 6.3 10*3/uL (ref 3.4–10.8)

## 2018-07-08 LAB — ANEMIA PANEL
Ferritin: 82 ng/mL (ref 15–150)
Folate, Hemolysate: 474 ng/mL
Folate, RBC: 1529 ng/mL (ref >498)
Hematocrit: 31 % — ABNORMAL LOW (ref 34.0–46.6)
Iron Saturation: 17 % (ref 15–55)
Iron: 48 ug/dL (ref 27–139)
Retic Ct Pct: 3.7 % — ABNORMAL HIGH (ref 0.6–2.6)
Total Iron Binding Capacity: 276 ug/dL (ref 250–450)
UIBC: 228 ug/dL (ref 118–369)
Vitamin B-12: 429 pg/mL (ref 232–1245)

## 2018-07-08 LAB — BASIC METABOLIC PANEL
BUN/Creatinine Ratio: 16 (ref 12–28)
BUN: 23 mg/dL (ref 8–27)
CO2: 22 mmol/L (ref 20–29)
Calcium: 9.5 mg/dL (ref 8.7–10.3)
Chloride: 103 mmol/L (ref 96–106)
Creatinine, Ser: 1.45 mg/dL — ABNORMAL HIGH (ref 0.57–1.00)
GFR calc Af Amer: 41 mL/min/{1.73_m2} — ABNORMAL LOW (ref >59)
GFR calc non Af Amer: 36 mL/min/{1.73_m2} — ABNORMAL LOW (ref >59)
Glucose: 89 mg/dL (ref 65–99)
Potassium: 4.6 mmol/L (ref 3.5–5.2)
Sodium: 139 mmol/L (ref 134–144)

## 2018-07-09 ENCOUNTER — Telehealth: Payer: Self-pay | Admitting: Nurse Practitioner

## 2018-07-09 NOTE — Telephone Encounter (Signed)
Spoke to patient via telephone to review lab results.  Discussed slight improvement in anemia, she states her bruising is "getting better".  Reports she is taking a daily iron pill at this time.  Denies SOB, fatigue, or blood in stool.  Discussed kidney function remains at her current baseline and recommended to continue to increase oral fluid intake.  She was able to verbalize back instructions and stated appreciation for call.

## 2018-07-11 ENCOUNTER — Telehealth: Payer: Medicare Other | Admitting: Nurse Practitioner

## 2018-07-14 ENCOUNTER — Ambulatory Visit (INDEPENDENT_AMBULATORY_CARE_PROVIDER_SITE_OTHER): Payer: Medicare Other | Admitting: Nurse Practitioner

## 2018-07-14 ENCOUNTER — Other Ambulatory Visit: Payer: Self-pay

## 2018-07-14 ENCOUNTER — Encounter: Payer: Self-pay | Admitting: Nurse Practitioner

## 2018-07-14 VITALS — BP 102/64 | HR 64 | Temp 97.8°F | Ht 62.0 in | Wt 181.0 lb

## 2018-07-14 DIAGNOSIS — L03116 Cellulitis of left lower limb: Secondary | ICD-10-CM | POA: Diagnosis not present

## 2018-07-14 NOTE — Progress Notes (Signed)
BP 102/64 (BP Location: Left Arm, Patient Position: Sitting, Cuff Size: Normal)   Pulse 64   Temp 97.8 F (36.6 C) (Oral)   Ht 5\' 2"  (1.575 m)   Wt 181 lb (82.1 kg)   LMP  (LMP Unknown)   SpO2 97%   BMI 33.11 kg/m    Subjective:    Patient ID: Amy Myers, female    DOB: 1945-05-25, 73 y.o.   MRN: [id number]  HPI: Amy Myers is a 73 y.o. female  Chief Complaint  Patient presents with  . Cellulitis    10 day F/U. Left Knee.     CELLULITIS LEFT KNEE (UNDER AREA OF BRUISING): Fall on 06/21/2018 with fracture to nose and multiple areas of bruisingto body(is onApixaban)+ stitches to lip, removed on 07/04/18.  Seen 06/27/18 for hospital follow-up and noted to have significant swelling with erythema and warmth to left knee + significant bruising to LLE. Started on Doxycycline and instructed to perform RICE to extremity, with gradual improvement noted at follow-up visits. Returns today forfollow-up, at last f/u on 07/07/18 day area had been improving. She reports decrease in discomfort to left knee, states it is improving as is redness and is continues to walk more with cane vs walker as initially was. Endorses bruising isgettingbetter, but has frustration over not being at pre-fall status.  Denies SOB, CP, or headaches. Taking Tylenol at homes as instructed.Checking temps at home and no fever, temps 97 to 98 range at home.  Initial labs showed H/H 10.1/31.1, MCV 92, PLT 209 with some improvement on 07/07/2018 labs, patient taking daily iron at this time.  Her last Doxycycline will be on Thursday, 07/17/2018.  She reports she is starting to "feel a lot better".  Relevant past medical, surgical, family and social history reviewed and updated as indicated. Interim medical history since our last visit reviewed. Allergies and medications reviewed and updated.  Review of Systems  Constitutional: Negative for activity change, appetite change, diaphoresis, fatigue and fever.   Respiratory: Negative for cough, chest tightness and shortness of breath.   Cardiovascular: Negative for chest pain, palpitations and leg swelling.  Gastrointestinal: Negative for abdominal distention, abdominal pain, constipation, diarrhea, nausea and vomiting.  Endocrine: Negative for cold intolerance, heat intolerance, polydipsia, polyphagia and polyuria.  Musculoskeletal: Positive for joint swelling.  Neurological: Negative for dizziness, syncope, weakness, light-headedness, numbness and headaches.  Psychiatric/Behavioral: Negative.     Per HPI unless specifically indicated above     Objective:    BP 102/64 (BP Location: Left Arm, Patient Position: Sitting, Cuff Size: Normal)   Pulse 64   Temp 97.8 F (36.6 C) (Oral)   Ht 5\' 2"  (1.575 m)   Wt 181 lb (82.1 kg)   LMP  (LMP Unknown)   SpO2 97%   BMI 33.11 kg/m   Wt Readings from Last 3 Encounters:  07/14/18 181 lb (82.1 kg)  07/07/18 178 lb (80.7 kg)  06/30/18 180 lb 9.6 oz (81.9 kg)    Physical Exam Vitals signs and nursing note reviewed.  Constitutional:      Appearance: She is well-developed.  HENT:     Head: Normocephalic.  Eyes:     General:        Right eye: No discharge.        Left eye: No discharge.     Conjunctiva/sclera: Conjunctivae normal.     Pupils: Pupils are equal, round, and reactive to light.  Neck:     Musculoskeletal: Normal range of motion  and neck supple.     Thyroid: No thyromegaly.     Vascular: No carotid bruit or JVD.  Cardiovascular:     Rate and Rhythm: Normal rate and regular rhythm.     Heart sounds: Normal heart sounds. No murmur. No gallop.   Pulmonary:     Effort: Pulmonary effort is normal.     Breath sounds: Normal breath sounds.  Abdominal:     General: Bowel sounds are normal.     Palpations: Abdomen is soft.  Musculoskeletal:     Right knee: She exhibits normal range of motion, no swelling, no ecchymosis, no laceration and no erythema. No tenderness found.     Left knee:  She exhibits decreased range of motion, swelling, ecchymosis and erythema. Tenderness found.     Right lower leg: No edema.     Left lower leg: No edema.     Comments: Scattered pale yellow to purple bruising to right anterior lower extremity (improving).  No warmth or erythema to RLE.  Left lower extremity with (trace) swelling and pale purple bruising + mild erythema with no warmth to anterior knee (erythema has continues to decrease and move inwards, area marked previous visit and less erythema marked today).  Bruising to left leg extends from medial/dorsal aspect left foot - lower anterior/posterior calf to upper posterior thigh (bruising has improved).  Negative Homan's bilaterally.  Right Calf 14 1/2 in and left 15 in (decrease in size from previous visit).     Lymphadenopathy:     Cervical: No cervical adenopathy.  Skin:    General: Skin is warm and dry.  Neurological:     Mental Status: She is alert and oriented to person, place, and time.  Psychiatric:        Mood and Affect: Mood normal.        Behavior: Behavior normal.        Thought Content: Thought content normal.        Judgment: Judgment normal.     Results for orders placed or performed in visit on 07/07/18  CBC with Differential/Platelet  Result Value Ref Range   WBC 6.3 3.4 - 10.8 x10E3/uL   RBC 3.44 (L) 3.77 - 5.28 x10E6/uL   Hemoglobin 10.5 (L) 11.1 - 15.9 g/dL   MCV 90 79 - 97 fL   MCH 30.5 26.6 - 33.0 pg   MCHC 33.9 31.5 - 35.7 g/dL   RDW 13.5 11.7 - 15.4 %   Platelets 200 150 - 450 x10E3/uL   Neutrophils 70 Not Estab. %   Lymphs 19 Not Estab. %   Monocytes 7 Not Estab. %   Eos 3 Not Estab. %   Basos 1 Not Estab. %   Neutrophils Absolute 4.4 1.4 - 7.0 x10E3/uL   Lymphocytes Absolute 1.2 0.7 - 3.1 x10E3/uL   Monocytes Absolute 0.4 0.1 - 0.9 x10E3/uL   EOS (ABSOLUTE) 0.2 0.0 - 0.4 x10E3/uL   Basophils Absolute 0.1 0.0 - 0.2 x10E3/uL   Immature Granulocytes 0 Not Estab. %   Immature Grans (Abs) 0.0 0.0 -  0.1 x10E3/uL  Anemia panel  Result Value Ref Range   Total Iron Binding Capacity 276 250 - 450 ug/dL   UIBC 228 118 - 369 ug/dL   Iron 48 27 - 139 ug/dL   Iron Saturation 17 15 - 55 %   Vitamin B-12 429 232 - 1,245 pg/mL   Folate, Hemolysate 474.0 Not Estab. ng/mL   Hematocrit 31.0 (L) 34.0 - 46.6 %  Folate, RBC 1,529 >498 ng/mL   Ferritin 82 15 - 150 ng/mL   Retic Ct Pct 3.7 (H) 0.6 - 2.6 %  Basic Metabolic Panel (BMET)  Result Value Ref Range   Glucose 89 65 - 99 mg/dL   BUN 23 8 - 27 mg/dL   Creatinine, Ser 1.45 (H) 0.57 - 1.00 mg/dL   GFR calc non Af Amer 36 (L) >59 mL/min/1.73   GFR calc Af Amer 41 (L) >59 mL/min/1.73   BUN/Creatinine Ratio 16 12 - 28   Sodium 139 134 - 144 mmol/L   Potassium 4.6 3.5 - 5.2 mmol/L   Chloride 103 96 - 106 mmol/L   CO2 22 20 - 29 mmol/L   Calcium 9.5 8.7 - 10.3 mg/dL      Assessment & Plan:   Problem List Items Addressed This Visit      Other   Cellulitis of left knee - Primary    Improving.  Less erythema and bruising with no warmth today.  Continue Doxycycline, with last dose on Thursday.  Recommend continue RICE at home and mark area daily + monitor temps.  Repeat CBC today.  Continue daily iron at home.  If fever or worsening symptoms to immediately present to office or ER.  Return next Wednesday for face to face visit and consider repeat imaging if swelling continues to decrease to knee.      Relevant Orders   CBC with Differential/Platelet       Follow up plan: Return in about 9 days (around 07/23/2018) for Wednesday in person afternoon visit.

## 2018-07-14 NOTE — Assessment & Plan Note (Signed)
Improving.  Less erythema and bruising with no warmth today.  Continue Doxycycline, with last dose on Thursday.  Recommend continue RICE at home and mark area daily + monitor temps.  Repeat CBC today.  Continue daily iron at home.  If fever or worsening symptoms to immediately present to office or ER.  Return next Wednesday for face to face visit and consider repeat imaging if swelling continues to decrease to knee.

## 2018-07-14 NOTE — Patient Instructions (Signed)

## 2018-07-15 LAB — CBC WITH DIFFERENTIAL/PLATELET
Basophils Absolute: 0.1 10*3/uL (ref 0.0–0.2)
Basos: 1 %
EOS (ABSOLUTE): 0.2 10*3/uL (ref 0.0–0.4)
Eos: 3 %
Hematocrit: 32.6 % — ABNORMAL LOW (ref 34.0–46.6)
Hemoglobin: 10.6 g/dL — ABNORMAL LOW (ref 11.1–15.9)
Immature Grans (Abs): 0 10*3/uL (ref 0.0–0.1)
Immature Granulocytes: 0 %
Lymphocytes Absolute: 1.4 10*3/uL (ref 0.7–3.1)
Lymphs: 24 %
MCH: 30.4 pg (ref 26.6–33.0)
MCHC: 32.5 g/dL (ref 31.5–35.7)
MCV: 93 fL (ref 79–97)
Monocytes Absolute: 0.5 10*3/uL (ref 0.1–0.9)
Monocytes: 8 %
Neutrophils Absolute: 3.7 10*3/uL (ref 1.4–7.0)
Neutrophils: 64 %
Platelets: 164 10*3/uL (ref 150–450)
RBC: 3.49 x10E6/uL — ABNORMAL LOW (ref 3.77–5.28)
RDW: 13.9 % (ref 11.7–15.4)
WBC: 5.7 10*3/uL (ref 3.4–10.8)

## 2018-07-23 ENCOUNTER — Ambulatory Visit (INDEPENDENT_AMBULATORY_CARE_PROVIDER_SITE_OTHER): Payer: Medicare Other | Admitting: Nurse Practitioner

## 2018-07-23 ENCOUNTER — Other Ambulatory Visit: Payer: Self-pay

## 2018-07-23 ENCOUNTER — Encounter: Payer: Self-pay | Admitting: Nurse Practitioner

## 2018-07-23 VITALS — BP 127/80 | HR 79 | Temp 97.5°F | Ht 59.0 in | Wt 185.0 lb

## 2018-07-23 DIAGNOSIS — L03116 Cellulitis of left lower limb: Secondary | ICD-10-CM | POA: Diagnosis not present

## 2018-07-23 NOTE — Assessment & Plan Note (Signed)
Improving.  Will recheck CBC and iron level today.  Continues to have discomfort and mild edema to left knee.  Will repeat imaging of knee now that swelling improved and refer to ortho for further recommendations.  Recommend continued use of Tylenol as needed for pain.  Recommend decrease sodium intake, rinsing canned vegetables well prior to eating, DASH diet and elevation of legs for 30 minutes every couple of hours to improve current BLE.  Return in 6 weeks for follow-up.

## 2018-07-23 NOTE — Progress Notes (Signed)
BP 127/80   Pulse 79   Temp (!) 97.5 F (36.4 C) (Oral)   Ht 4\' 11"  (1.499 m)   Wt 185 lb (83.9 kg)   LMP  (LMP Unknown)   SpO2 97%   BMI 37.37 kg/m    Subjective:    Patient ID: Amy Myers, female    DOB: [birth year], 73 y.o.   MRN: [id number]  HPI: Amy Myers is a 73 y.o. female  Chief Complaint  Patient presents with  . Cellulitis    f/u     CELLULITIS LEFT KNEE (UNDER AREA OF BRUISING): Initial presentation after fall on 06/21/2018 with fracture to nose and multiple areas of bruisingto body(is onApixaban)+ stitches to lip, removed on 07/04/18.  Seen 06/27/18 for hospital follow-up and noted to have significant swelling with erythema and warmth to left knee + significant bruising to LLE. Started on Doxycycline and instructed to performRICE toextremity, with gradual improvement noted at follow-up visits. Returns today forfollow-up, at last f/u on 07/14/18 area had been improving significantly. She reports decrease in discomfort to left knee (only discomfort noted is if she extends or flexes knee back), states it is improving as isredness and continues to walk with cane, no further use of walker. Endorses bruising isgettingbetter, but continues to have frustration over not being at pre-fall status.  Taking Tylenol at homes as instructed.Checking temps at home and no fever, temps remain 97 to98 range at home. Initial labs showed H/H 10.1/31.1, MCV 92, PLT 209 with some improvement on 07/14/2018 labs H/H 10.6/32.6, patient taking daily iron at this time, as iron on lower end of normal at 48 on 07/07/2018. Her last Doxycycline was on Thursday, 07/17/2018.  She endorses some increase in edema to BLE, but states she has been eating "a lot" of canned vegetables" and high salt foods due to the current COVID 19 pandemic and being unable to get her usual foods at grocery store.  Denies SOB, CP, fatigue, cold intolerance, orthopnea, or N&V.    Relevant past medical, surgical,  family and social history reviewed and updated as indicated. Interim medical history since our last visit reviewed. Allergies and medications reviewed and updated.  Review of Systems  Constitutional: Negative for activity change, appetite change, diaphoresis, fatigue and fever.  Respiratory: Negative for cough, chest tightness and shortness of breath.   Cardiovascular: Negative for chest pain, palpitations and leg swelling.  Gastrointestinal: Negative for abdominal distention, abdominal pain, constipation, diarrhea, nausea and vomiting.  Endocrine: Negative for cold intolerance, heat intolerance, polydipsia, polyphagia and polyuria.  Musculoskeletal: Positive for joint swelling.  Neurological: Negative for dizziness, syncope, weakness, light-headedness, numbness and headaches.  Psychiatric/Behavioral: Negative.     Per HPI unless specifically indicated above     Objective:    BP 127/80   Pulse 79   Temp (!) 97.5 F (36.4 C) (Oral)   Ht 4\' 11"  (1.499 m)   Wt 185 lb (83.9 kg)   LMP  (LMP Unknown)   SpO2 97%   BMI 37.37 kg/m   Wt Readings from Last 3 Encounters:  07/23/18 185 lb (83.9 kg)  07/14/18 181 lb (82.1 kg)  07/07/18 178 lb (80.7 kg)    Physical Exam Vitals signs and nursing note reviewed.  Constitutional:      General: She is awake.     Appearance: She is well-developed. She is obese.  HENT:     Head: Normocephalic.     Right Ear: Hearing normal.     Left Ear:  Hearing normal.  Eyes:     General:        Right eye: No discharge.        Left eye: No discharge.     Conjunctiva/sclera: Conjunctivae normal.     Pupils: Pupils are equal, round, and reactive to light.  Neck:     Musculoskeletal: Normal range of motion and neck supple.     Thyroid: No thyromegaly.     Vascular: No carotid bruit.  Cardiovascular:     Rate and Rhythm: Normal rate and regular rhythm.     Heart sounds: Normal heart sounds.  Pulmonary:     Effort: Pulmonary effort is normal.      Breath sounds: Normal breath sounds.  Abdominal:     General: Bowel sounds are normal.     Palpations: Abdomen is soft.  Musculoskeletal:     Right knee: She exhibits normal range of motion, no swelling, no ecchymosis and no erythema. No tenderness found.     Left knee: She exhibits decreased range of motion, swelling and erythema. She exhibits no laceration. Tenderness found.     Comments: No further bruising to right anterior lower extremity.  No warmth or erythema to RLE.  1+ edema RLE. Left lower extremity with (1+) swelling and mild erythema with no warmth to anterior knee (erythema has decreased and pale purple bruising to anterior aspect, decreased in size).  No bruising to remainder of LLE (improved).  Negative Homan's bilaterally.  Right Calf 16 in and left 16 in.  Lymphadenopathy:     Cervical: No cervical adenopathy.  Skin:    General: Skin is warm and dry.  Neurological:     Mental Status: She is alert and oriented to person, place, and time.  Psychiatric:        Attention and Perception: Attention normal.        Mood and Affect: Mood normal.        Speech: Speech normal.        Behavior: Behavior normal. Behavior is cooperative.        Thought Content: Thought content normal.        Judgment: Judgment normal.     Results for orders placed or performed in visit on 07/14/18  CBC with Differential/Platelet  Result Value Ref Range   WBC 5.7 3.4 - 10.8 x10E3/uL   RBC 3.49 (L) 3.77 - 5.28 x10E6/uL   Hemoglobin 10.6 (L) 11.1 - 15.9 g/dL   Hematocrit 32.6 (L) 34.0 - 46.6 %   MCV 93 79 - 97 fL   MCH 30.4 26.6 - 33.0 pg   MCHC 32.5 31.5 - 35.7 g/dL   RDW 13.9 11.7 - 15.4 %   Platelets 164 150 - 450 x10E3/uL   Neutrophils 64 Not Estab. %   Lymphs 24 Not Estab. %   Monocytes 8 Not Estab. %   Eos 3 Not Estab. %   Basos 1 Not Estab. %   Neutrophils Absolute 3.7 1.4 - 7.0 x10E3/uL   Lymphocytes Absolute 1.4 0.7 - 3.1 x10E3/uL   Monocytes Absolute 0.5 0.1 - 0.9 x10E3/uL   EOS  (ABSOLUTE) 0.2 0.0 - 0.4 x10E3/uL   Basophils Absolute 0.1 0.0 - 0.2 x10E3/uL   Immature Granulocytes 0 Not Estab. %   Immature Grans (Abs) 0.0 0.0 - 0.1 x10E3/uL      Assessment & Plan:   Problem List Items Addressed This Visit      Other   Cellulitis of left knee - Primary  Improving.  Will recheck CBC and iron level today.  Continues to have discomfort and mild edema to left knee.  Will repeat imaging of knee now that swelling improved and refer to ortho for further recommendations.  Recommend continued use of Tylenol as needed for pain.  Recommend decrease sodium intake, rinsing canned vegetables well prior to eating, DASH diet and elevation of legs for 30 minutes every couple of hours to improve current BLE.  Return in 6 weeks for follow-up.       Relevant Orders   CBC with Differential/Platelet   Iron   DG Knee Complete 4 Views Left   Ambulatory referral to Orthopedics       Follow up plan: Return in about 6 weeks (around 09/03/2018) for HTN/HLD and knee pain.

## 2018-07-23 NOTE — Patient Instructions (Signed)
DASH Eating Plan  DASH stands for "Dietary Approaches to Stop Hypertension." The DASH eating plan is a healthy eating plan that has been shown to reduce high blood pressure (hypertension). It may also reduce your risk for type 2 diabetes, heart disease, and stroke. The DASH eating plan may also help with weight loss.  What are tips for following this plan?    General guidelines   Avoid eating more than 2,300 mg (milligrams) of salt (sodium) a day. If you have hypertension, you may need to reduce your sodium intake to 1,500 mg a day.   Limit alcohol intake to no more than 1 drink a day for nonpregnant women and 2 drinks a day for men. One drink equals 12 oz of beer, 5 oz of wine, or 1 oz of hard liquor.   Work with your health care provider to maintain a healthy body weight or to lose weight. Ask what an ideal weight is for you.   Get at least 30 minutes of exercise that causes your heart to beat faster (aerobic exercise) most days of the week. Activities may include walking, swimming, or biking.   Work with your health care provider or diet and nutrition specialist (dietitian) to adjust your eating plan to your individual calorie needs.  Reading food labels     Check food labels for the amount of sodium per serving. Choose foods with less than 5 percent of the Daily Value of sodium. Generally, foods with less than 300 mg of sodium per serving fit into this eating plan.   To find whole grains, look for the word "whole" as the first word in the ingredient list.  Shopping   Buy products labeled as "low-sodium" or "no salt added."   Buy fresh foods. Avoid canned foods and premade or frozen meals.  Cooking   Avoid adding salt when cooking. Use salt-free seasonings or herbs instead of table salt or sea salt. Check with your health care provider or pharmacist before using salt substitutes.   Do not fry foods. Cook foods using healthy methods such as baking, boiling, grilling, and broiling instead.   Cook with  heart-healthy oils, such as olive, canola, soybean, or sunflower oil.  Meal planning   Eat a balanced diet that includes:  ? 5 or more servings of fruits and vegetables each day. At each meal, try to fill half of your plate with fruits and vegetables.  ? Up to 6-8 servings of whole grains each day.  ? Less than 6 oz of lean meat, poultry, or fish each day. A 3-oz serving of meat is about the same size as a deck of cards. One egg equals 1 oz.  ? 2 servings of low-fat dairy each day.  ? A serving of nuts, seeds, or beans 5 times each week.  ? Heart-healthy fats. Healthy fats called Omega-3 fatty acids are found in foods such as flaxseeds and coldwater fish, like sardines, salmon, and mackerel.   Limit how much you eat of the following:  ? Canned or prepackaged foods.  ? Food that is high in trans fat, such as fried foods.  ? Food that is high in saturated fat, such as fatty meat.  ? Sweets, desserts, sugary drinks, and other foods with added sugar.  ? Full-fat dairy products.   Do not salt foods before eating.   Try to eat at least 2 vegetarian meals each week.   Eat more home-cooked food and less restaurant, buffet, and fast food.     When eating at a restaurant, ask that your food be prepared with less salt or no salt, if possible.  What foods are recommended?  The items listed may not be a complete list. Talk with your dietitian about what dietary choices are best for you.  Grains  Whole-grain or whole-wheat bread. Whole-grain or whole-wheat pasta. Brown rice. Oatmeal. Quinoa. Bulgur. Whole-grain and low-sodium cereals. Pita bread. Low-fat, low-sodium crackers. Whole-wheat flour tortillas.  Vegetables  Fresh or frozen vegetables (raw, steamed, roasted, or grilled). Low-sodium or reduced-sodium tomato and vegetable juice. Low-sodium or reduced-sodium tomato sauce and tomato paste. Low-sodium or reduced-sodium canned vegetables.  Fruits  All fresh, dried, or frozen fruit. Canned fruit in natural juice (without  added sugar).  Meat and other protein foods  Skinless chicken or turkey. Ground chicken or turkey. Pork with fat trimmed off. Fish and seafood. Egg whites. Dried beans, peas, or lentils. Unsalted nuts, nut butters, and seeds. Unsalted canned beans. Lean cuts of beef with fat trimmed off. Low-sodium, lean deli meat.  Dairy  Low-fat (1%) or fat-free (skim) milk. Fat-free, low-fat, or reduced-fat cheeses. Nonfat, low-sodium ricotta or cottage cheese. Low-fat or nonfat yogurt. Low-fat, low-sodium cheese.  Fats and oils  Soft margarine without trans fats. Vegetable oil. Low-fat, reduced-fat, or light mayonnaise and salad dressings (reduced-sodium). Canola, safflower, olive, soybean, and sunflower oils. Avocado.  Seasoning and other foods  Herbs. Spices. Seasoning mixes without salt. Unsalted popcorn and pretzels. Fat-free sweets.  What foods are not recommended?  The items listed may not be a complete list. Talk with your dietitian about what dietary choices are best for you.  Grains  Baked goods made with fat, such as croissants, muffins, or some breads. Dry pasta or rice meal packs.  Vegetables  Creamed or fried vegetables. Vegetables in a cheese sauce. Regular canned vegetables (not low-sodium or reduced-sodium). Regular canned tomato sauce and paste (not low-sodium or reduced-sodium). Regular tomato and vegetable juice (not low-sodium or reduced-sodium). Pickles. Olives.  Fruits  Canned fruit in a light or heavy syrup. Fried fruit. Fruit in cream or butter sauce.  Meat and other protein foods  Fatty cuts of meat. Ribs. Fried meat. Bacon. Sausage. Bologna and other processed lunch meats. Salami. Fatback. Hotdogs. Bratwurst. Salted nuts and seeds. Canned beans with added salt. Canned or smoked fish. Whole eggs or egg yolks. Chicken or turkey with skin.  Dairy  Whole or 2% milk, cream, and half-and-half. Whole or full-fat cream cheese. Whole-fat or sweetened yogurt. Full-fat cheese. Nondairy creamers. Whipped toppings.  Processed cheese and cheese spreads.  Fats and oils  Butter. Stick margarine. Lard. Shortening. Ghee. Bacon fat. Tropical oils, such as coconut, palm kernel, or palm oil.  Seasoning and other foods  Salted popcorn and pretzels. Onion salt, garlic salt, seasoned salt, table salt, and sea salt. Worcestershire sauce. Tartar sauce. Barbecue sauce. Teriyaki sauce. Soy sauce, including reduced-sodium. Steak sauce. Canned and packaged gravies. Fish sauce. Oyster sauce. Cocktail sauce. Horseradish that you find on the shelf. Ketchup. Mustard. Meat flavorings and tenderizers. Bouillon cubes. Hot sauce and Tabasco sauce. Premade or packaged marinades. Premade or packaged taco seasonings. Relishes. Regular salad dressings.  Where to find more information:   National Heart, Lung, and Blood Institute: www.nhlbi.nih.gov   American Heart Association: www.heart.org  Summary   The DASH eating plan is a healthy eating plan that has been shown to reduce high blood pressure (hypertension). It may also reduce your risk for type 2 diabetes, heart disease, and stroke.   With the   DASH eating plan, you should limit salt (sodium) intake to 2,300 mg a day. If you have hypertension, you may need to reduce your sodium intake to 1,500 mg a day.   When on the DASH eating plan, aim to eat more fresh fruits and vegetables, whole grains, lean proteins, low-fat dairy, and heart-healthy fats.   Work with your health care provider or diet and nutrition specialist (dietitian) to adjust your eating plan to your individual calorie needs.  This information is not intended to replace advice given to you by your health care provider. Make sure you discuss any questions you have with your health care provider.  Document Released: 03/22/2011 Document Revised: 03/26/2016 Document Reviewed: 03/26/2016  Elsevier Interactive Patient Education  2019 Elsevier Inc.

## 2018-07-24 ENCOUNTER — Inpatient Hospital Stay: Admit: 2018-07-24 | Payer: Self-pay | Source: Ambulatory Visit | Admitting: *Deleted

## 2018-07-24 ENCOUNTER — Ambulatory Visit
Admission: RE | Admit: 2018-07-24 | Discharge: 2018-07-24 | Disposition: A | Discharge status: Home / Self Care | Payer: Medicare Other | Source: Ambulatory Visit | Attending: Nurse Practitioner | Admitting: Nurse Practitioner

## 2018-07-24 DIAGNOSIS — L03116 Cellulitis of left lower limb: Secondary | ICD-10-CM | POA: Diagnosis not present

## 2018-07-24 DIAGNOSIS — M25562 Pain in left knee: Secondary | ICD-10-CM | POA: Diagnosis not present

## 2018-07-26 LAB — CBC WITH DIFFERENTIAL/PLATELET
Basophils Absolute: 0 10*3/uL (ref 0.0–0.2)
Basos: 1 %
EOS (ABSOLUTE): 0.2 10*3/uL (ref 0.0–0.4)
Eos: 3 %
Hematocrit: 34.3 % (ref 34.0–46.6)
Hemoglobin: 10.6 g/dL — ABNORMAL LOW (ref 11.1–15.9)
Immature Grans (Abs): 0 10*3/uL (ref 0.0–0.1)
Immature Granulocytes: 0 %
Lymphocytes Absolute: 1.2 10*3/uL (ref 0.7–3.1)
Lymphs: 22 %
MCH: 29.1 pg (ref 26.6–33.0)
MCHC: 30.9 g/dL — ABNORMAL LOW (ref 31.5–35.7)
MCV: 94 fL (ref 79–97)
Monocytes Absolute: 0.3 10*3/uL (ref 0.1–0.9)
Monocytes: 6 %
Neutrophils Absolute: 3.9 10*3/uL (ref 1.4–7.0)
Neutrophils: 68 %
Platelets: 129 10*3/uL — ABNORMAL LOW (ref 150–450)
RBC: 3.64 x10E6/uL — ABNORMAL LOW (ref 3.77–5.28)
RDW: 13.9 % (ref 11.7–15.4)
WBC: 5.7 10*3/uL (ref 3.4–10.8)

## 2018-07-26 LAB — IRON

## 2018-07-28 ENCOUNTER — Telehealth: Payer: Self-pay | Admitting: Nurse Practitioner

## 2018-07-28 NOTE — Telephone Encounter (Signed)
Copied from [place] [id number]. Topic: Appointment Scheduling - Scheduling Inquiry for Clinic >> Jul 28, 2018  1:45 PM Vernona Rieger wrote: Reason for CRM: pt would like to know does she need to come 4/16 since she has an appt on 5/20. Please advise. >> Jul 28, 2018  2:07 PM Shaune Pollack wrote: Does patient need to keep the appointment please advise  Thank you

## 2018-07-29 DIAGNOSIS — S8002XA Contusion of left knee, initial encounter: Secondary | ICD-10-CM | POA: Diagnosis not present

## 2018-07-31 ENCOUNTER — Ambulatory Visit: Payer: Medicare Other | Admitting: Nurse Practitioner

## 2018-08-27 DIAGNOSIS — S8002XA Contusion of left knee, initial encounter: Secondary | ICD-10-CM | POA: Insufficient documentation

## 2018-09-03 ENCOUNTER — Other Ambulatory Visit: Payer: Self-pay | Admitting: Nurse Practitioner

## 2018-09-03 ENCOUNTER — Ambulatory Visit (INDEPENDENT_AMBULATORY_CARE_PROVIDER_SITE_OTHER): Payer: Medicare Other | Admitting: Nurse Practitioner

## 2018-09-03 ENCOUNTER — Other Ambulatory Visit: Payer: Self-pay

## 2018-09-03 ENCOUNTER — Encounter: Payer: Self-pay | Admitting: Nurse Practitioner

## 2018-09-03 VITALS — BP 133/81 | HR 73 | Temp 97.8°F | Ht 59.0 in | Wt 176.0 lb

## 2018-09-03 DIAGNOSIS — N183 Chronic kidney disease, stage 3 (moderate): Secondary | ICD-10-CM

## 2018-09-03 DIAGNOSIS — E538 Deficiency of other specified B group vitamins: Secondary | ICD-10-CM | POA: Diagnosis not present

## 2018-09-03 DIAGNOSIS — D508 Other iron deficiency anemias: Secondary | ICD-10-CM | POA: Diagnosis not present

## 2018-09-03 DIAGNOSIS — D509 Iron deficiency anemia, unspecified: Secondary | ICD-10-CM | POA: Insufficient documentation

## 2018-09-03 DIAGNOSIS — L03116 Cellulitis of left lower limb: Secondary | ICD-10-CM | POA: Diagnosis not present

## 2018-09-03 DIAGNOSIS — I129 Hypertensive chronic kidney disease with stage 1 through stage 4 chronic kidney disease, or unspecified chronic kidney disease: Secondary | ICD-10-CM

## 2018-09-03 DIAGNOSIS — E7800 Pure hypercholesterolemia, unspecified: Secondary | ICD-10-CM

## 2018-09-03 NOTE — Progress Notes (Signed)
BP 133/81   Pulse 73   Temp 97.8 F (36.6 C) (Oral)   Ht 4' 11"  (1.499 m)   Wt 176 lb (79.8 kg)   LMP  (LMP Unknown)   SpO2 98%   BMI 35.55 kg/m    Subjective:    Patient ID: Amy Myers, female    DOB: 1945/09/18, 73 y.o.   MRN: [id number]  HPI: Amy Myers is a 73 y.o. female  Chief Complaint  Patient presents with  . Hypertension    f/u  . Hyperlipidemia  . Knee Pain   HYPERTENSION / [facility], Dr. Humphrey Rolls, and nephrology.  Has upcoming visit with then in June. Satisfied with current treatment? yes Duration of hypertension: chronic BP monitoring frequency: a few times a month BP range: 129/77 on check at home recently, higher in afternoon then morning BP medication side effects: no Duration of hyperlipidemia: chronic Cholesterol medication side effects: no Cholesterol supplements: none Medication compliance: good compliance Aspirin: no Recent stressors: no Recurrent headaches: no Visual changes: no Palpitations: no Dyspnea: no Chest pain: no Lower extremity edema: no Dizzy/lightheaded: no   KNEE PAIN Followed by Rosanne Gutting and is doing exercises at home for now.  States this is improving.  Initial injury to knee was from fall 3/72020. She repotrs swelling is going down and pain is improved. Duration: months Involved knee: left Mechanism of injury: trauma Relief with NSAIDs?:  No NSAIDs Taken Weakness with weight bearing or walking: no Sensation of giving way: no Locking: no Popping: no Bruising: improving Swelling: no Redness: no Paresthesias/decreased sensation: no Fevers: no   ANEMIA Had some anemia noted after fall.  Currently taking iron supplement daily. Anemia status: stable Etiology of anemia: Duration of anemia treatment:  Compliance with treatment: good compliance Iron supplementation side effects: no Severity of anemia: mild Fatigue: no Decreased exercise tolerance: no  Dyspnea on exertion: no  Palpitations: no Bleeding: no Pica: no  Relevant past medical, surgical, family and social history reviewed and updated as indicated. Interim medical history since our last visit reviewed. Allergies and medications reviewed and updated.  Review of Systems  Constitutional: Negative for activity change, appetite change, diaphoresis, fatigue and fever.  Respiratory: Negative for cough, chest tightness and shortness of breath.   Cardiovascular: Negative for chest pain, palpitations and leg swelling.  Gastrointestinal: Negative for abdominal distention, abdominal pain, constipation, diarrhea, nausea and vomiting.  Endocrine: Negative for cold intolerance, heat intolerance, polydipsia, polyphagia and polyuria.  Musculoskeletal: Negative for arthralgias and joint swelling.  Neurological: Negative for dizziness, syncope, weakness, light-headedness, numbness and headaches.  Psychiatric/Behavioral: Negative.     Per HPI unless specifically indicated above     Objective:    BP 133/81   Pulse 73   Temp 97.8 F (36.6 C) (Oral)   Ht 4' 11"  (1.499 m)   Wt 176 lb (79.8 kg)   LMP  (LMP Unknown)   SpO2 98%   BMI 35.55 kg/m   Wt Readings from Last 3 Encounters:  09/03/18 176 lb (79.8 kg)  07/23/18 185 lb (83.9 kg)  07/14/18 181 lb (82.1 kg)    Physical Exam Vitals signs and nursing note reviewed.  Constitutional:      General: She is awake. She is not in acute distress.    Appearance: She is well-developed. She is obese. She is not ill-appearing.  HENT:     Head: Normocephalic.  Eyes:     General:        Right eye: No  discharge.        Left eye: No discharge.     Conjunctiva/sclera: Conjunctivae normal.     Pupils: Pupils are equal, round, and reactive to light.  Neck:     Musculoskeletal: Normal range of motion and neck supple.     Thyroid: No thyromegaly.     Vascular: No carotid bruit.  Cardiovascular:     Rate and Rhythm: Normal rate and regular rhythm.     Heart sounds:  Normal heart sounds.  Pulmonary:     Effort: Pulmonary effort is normal.     Breath sounds: Normal breath sounds.  Abdominal:     General: Bowel sounds are normal.     Palpations: Abdomen is soft.  Musculoskeletal:     Right knee: She exhibits normal range of motion, no swelling and no erythema. No tenderness found.     Left knee: She exhibits ecchymosis. She exhibits normal range of motion, no swelling and no erythema. No tenderness found.     Right lower leg: Edema (trace) present.     Left lower leg: Edema (trace) present.     Comments: Left knee with mild, pale purple bruising remaining, but overall significantly improved from previous visit with no further erythema or edema.  Full ROM bilateral knees with no crepitus.  Lymphadenopathy:     Cervical: No cervical adenopathy.  Skin:    General: Skin is warm and dry.  Neurological:     Mental Status: She is alert and oriented to person, place, and time.  Psychiatric:        Attention and Perception: Attention normal.        Mood and Affect: Mood normal.        Behavior: Behavior normal. Behavior is cooperative.        Thought Content: Thought content normal.        Judgment: Judgment normal.    Results for orders placed or performed in visit on 07/23/18  CBC with Differential/Platelet  Result Value Ref Range   WBC 5.7 3.4 - 10.8 x10E3/uL   RBC 3.64 (L) 3.77 - 5.28 x10E6/uL   Hemoglobin 10.6 (L) 11.1 - 15.9 g/dL   Hematocrit 34.3 34.0 - 46.6 %   MCV 94 79 - 97 fL   MCH 29.1 26.6 - 33.0 pg   MCHC 30.9 (L) 31.5 - 35.7 g/dL   RDW 13.9 11.7 - 15.4 %   Platelets 129 (L) 150 - 450 x10E3/uL   Neutrophils 68 Not Estab. %   Lymphs 22 Not Estab. %   Monocytes 6 Not Estab. %   Eos 3 Not Estab. %   Basos 1 Not Estab. %   Neutrophils Absolute 3.9 1.4 - 7.0 x10E3/uL   Lymphocytes Absolute 1.2 0.7 - 3.1 x10E3/uL   Monocytes Absolute 0.3 0.1 - 0.9 x10E3/uL   EOS (ABSOLUTE) 0.2 0.0 - 0.4 x10E3/uL   Basophils Absolute 0.0 0.0 - 0.2  x10E3/uL   Immature Granulocytes 0 Not Estab. %   Immature Grans (Abs) 0.0 0.0 - 0.1 x10E3/uL  Iron  Result Value Ref Range   Iron CANCELED ug/dL      Assessment & Plan:   Problem List Items Addressed This Visit      Cardiovascular and Mediastinum   Benign hypertension with chronic kidney disease, stage III (HCC) - Primary    Chronic, ongoing.  Followed by cardiology and nephrology.  Continue current medication regimen and collaboration with nephrology and cardiology.  CMP today.      Relevant  Orders   Comp Met (CMET)     Other   Hyperlipidemia    Chronic, ongoing.  Continue current medication regimen.  Lipid panel next visit per patient request (fasting).      Vitamin B12 deficiency    Recheck B12 today and continue multivitamin.      Relevant Orders   CBC with Differential/Platelet   Anemia panel   Iron deficiency anemia    Recheck anemia panel and CBC today.  Continue daily iron supplement.      RESOLVED: Cellulitis of left knee    Improved.  Continue to monitor, will resolve issue at this time due to improvement.          Follow up plan: Return in about 4 months (around 01/04/2019) for HTN/HLD, Hypothyroidism, A-fib, anemia.

## 2018-09-03 NOTE — Assessment & Plan Note (Signed)
Chronic, ongoing.  Continue current medication regimen.  Lipid panel next visit per patient request (fasting).

## 2018-09-03 NOTE — Assessment & Plan Note (Signed)
Recheck anemia panel and CBC today.  Continue daily iron supplement.

## 2018-09-03 NOTE — Telephone Encounter (Signed)
Requested Prescriptions  Pending Prescriptions Disp Refills  . raloxifene (EVISTA) 60 MG tablet [Pharmacy Med Name: RALOXIFENE HCL 60 MG TABLET] 30 tablet 2    Sig: Take 1 tablet (60 mg total) by mouth daily.     OB/GYN:  Selective Estrogen Receptor Modulators Passed - 09/03/2018  4:03 PM      Passed - Valid encounter within last 12 months    Recent Outpatient Visits          Today Benign hypertension with chronic kidney disease, stage III ([place])   [place], Jolene T, NP   1 month ago Cellulitis of left knee   [place], [place] T, NP   1 month ago Cellulitis of left knee   [place], Kenel T, NP   1 month ago Cellulitis of left knee   [place], [place] T, NP   2 months ago Cellulitis of left knee   [place], Barbaraann Faster, NP      Future Appointments            In 3 months  [place], [place]   In 3 months Cannady, Barbaraann Faster, NP [employer]

## 2018-09-03 NOTE — Patient Instructions (Signed)
DASH Eating Plan  DASH stands for "Dietary Approaches to Stop Hypertension." The DASH eating plan is a healthy eating plan that has been shown to reduce high blood pressure (hypertension). It may also reduce your risk for type 2 diabetes, heart disease, and stroke. The DASH eating plan may also help with weight loss.  What are tips for following this plan?    General guidelines   Avoid eating more than 2,300 mg (milligrams) of salt (sodium) a day. If you have hypertension, you may need to reduce your sodium intake to 1,500 mg a day.   Limit alcohol intake to no more than 1 drink a day for nonpregnant women and 2 drinks a day for men. One drink equals 12 oz of beer, 5 oz of wine, or 1 oz of hard liquor.   Work with your health care provider to maintain a healthy body weight or to lose weight. Ask what an ideal weight is for you.   Get at least 30 minutes of exercise that causes your heart to beat faster (aerobic exercise) most days of the week. Activities may include walking, swimming, or biking.   Work with your health care provider or diet and nutrition specialist (dietitian) to adjust your eating plan to your individual calorie needs.  Reading food labels     Check food labels for the amount of sodium per serving. Choose foods with less than 5 percent of the Daily Value of sodium. Generally, foods with less than 300 mg of sodium per serving fit into this eating plan.   To find whole grains, look for the word "whole" as the first word in the ingredient list.  Shopping   Buy products labeled as "low-sodium" or "no salt added."   Buy fresh foods. Avoid canned foods and premade or frozen meals.  Cooking   Avoid adding salt when cooking. Use salt-free seasonings or herbs instead of table salt or sea salt. Check with your health care provider or pharmacist before using salt substitutes.   Do not fry foods. Cook foods using healthy methods such as baking, boiling, grilling, and broiling instead.   Cook with  heart-healthy oils, such as olive, canola, soybean, or sunflower oil.  Meal planning   Eat a balanced diet that includes:  ? 5 or more servings of fruits and vegetables each day. At each meal, try to fill half of your plate with fruits and vegetables.  ? Up to 6-8 servings of whole grains each day.  ? Less than 6 oz of lean meat, poultry, or fish each day. A 3-oz serving of meat is about the same size as a deck of cards. One egg equals 1 oz.  ? 2 servings of low-fat dairy each day.  ? A serving of nuts, seeds, or beans 5 times each week.  ? Heart-healthy fats. Healthy fats called Omega-3 fatty acids are found in foods such as flaxseeds and coldwater fish, like sardines, salmon, and mackerel.   Limit how much you eat of the following:  ? Canned or prepackaged foods.  ? Food that is high in trans fat, such as fried foods.  ? Food that is high in saturated fat, such as fatty meat.  ? Sweets, desserts, sugary drinks, and other foods with added sugar.  ? Full-fat dairy products.   Do not salt foods before eating.   Try to eat at least 2 vegetarian meals each week.   Eat more home-cooked food and less restaurant, buffet, and fast food.     When eating at a restaurant, ask that your food be prepared with less salt or no salt, if possible.  What foods are recommended?  The items listed may not be a complete list. Talk with your dietitian about what dietary choices are best for you.  Grains  Whole-grain or whole-wheat bread. Whole-grain or whole-wheat pasta. Brown rice. Oatmeal. Quinoa. Bulgur. Whole-grain and low-sodium cereals. Pita bread. Low-fat, low-sodium crackers. Whole-wheat flour tortillas.  Vegetables  Fresh or frozen vegetables (raw, steamed, roasted, or grilled). Low-sodium or reduced-sodium tomato and vegetable juice. Low-sodium or reduced-sodium tomato sauce and tomato paste. Low-sodium or reduced-sodium canned vegetables.  Fruits  All fresh, dried, or frozen fruit. Canned fruit in natural juice (without  added sugar).  Meat and other protein foods  Skinless chicken or turkey. Ground chicken or turkey. Pork with fat trimmed off. Fish and seafood. Egg whites. Dried beans, peas, or lentils. Unsalted nuts, nut butters, and seeds. Unsalted canned beans. Lean cuts of beef with fat trimmed off. Low-sodium, lean deli meat.  Dairy  Low-fat (1%) or fat-free (skim) milk. Fat-free, low-fat, or reduced-fat cheeses. Nonfat, low-sodium ricotta or cottage cheese. Low-fat or nonfat yogurt. Low-fat, low-sodium cheese.  Fats and oils  Soft margarine without trans fats. Vegetable oil. Low-fat, reduced-fat, or light mayonnaise and salad dressings (reduced-sodium). Canola, safflower, olive, soybean, and sunflower oils. Avocado.  Seasoning and other foods  Herbs. Spices. Seasoning mixes without salt. Unsalted popcorn and pretzels. Fat-free sweets.  What foods are not recommended?  The items listed may not be a complete list. Talk with your dietitian about what dietary choices are best for you.  Grains  Baked goods made with fat, such as croissants, muffins, or some breads. Dry pasta or rice meal packs.  Vegetables  Creamed or fried vegetables. Vegetables in a cheese sauce. Regular canned vegetables (not low-sodium or reduced-sodium). Regular canned tomato sauce and paste (not low-sodium or reduced-sodium). Regular tomato and vegetable juice (not low-sodium or reduced-sodium). Pickles. Olives.  Fruits  Canned fruit in a light or heavy syrup. Fried fruit. Fruit in cream or butter sauce.  Meat and other protein foods  Fatty cuts of meat. Ribs. Fried meat. Bacon. Sausage. Bologna and other processed lunch meats. Salami. Fatback. Hotdogs. Bratwurst. Salted nuts and seeds. Canned beans with added salt. Canned or smoked fish. Whole eggs or egg yolks. Chicken or turkey with skin.  Dairy  Whole or 2% milk, cream, and half-and-half. Whole or full-fat cream cheese. Whole-fat or sweetened yogurt. Full-fat cheese. Nondairy creamers. Whipped toppings.  Processed cheese and cheese spreads.  Fats and oils  Butter. Stick margarine. Lard. Shortening. Ghee. Bacon fat. Tropical oils, such as coconut, palm kernel, or palm oil.  Seasoning and other foods  Salted popcorn and pretzels. Onion salt, garlic salt, seasoned salt, table salt, and sea salt. Worcestershire sauce. Tartar sauce. Barbecue sauce. Teriyaki sauce. Soy sauce, including reduced-sodium. Steak sauce. Canned and packaged gravies. Fish sauce. Oyster sauce. Cocktail sauce. Horseradish that you find on the shelf. Ketchup. Mustard. Meat flavorings and tenderizers. Bouillon cubes. Hot sauce and Tabasco sauce. Premade or packaged marinades. Premade or packaged taco seasonings. Relishes. Regular salad dressings.  Where to find more information:   National Heart, Lung, and Blood Institute: www.nhlbi.nih.gov   American Heart Association: www.heart.org  Summary   The DASH eating plan is a healthy eating plan that has been shown to reduce high blood pressure (hypertension). It may also reduce your risk for type 2 diabetes, heart disease, and stroke.   With the   DASH eating plan, you should limit salt (sodium) intake to 2,300 mg a day. If you have hypertension, you may need to reduce your sodium intake to 1,500 mg a day.   When on the DASH eating plan, aim to eat more fresh fruits and vegetables, whole grains, lean proteins, low-fat dairy, and heart-healthy fats.   Work with your health care provider or diet and nutrition specialist (dietitian) to adjust your eating plan to your individual calorie needs.  This information is not intended to replace advice given to you by your health care provider. Make sure you discuss any questions you have with your health care provider.  Document Released: 03/22/2011 Document Revised: 03/26/2016 Document Reviewed: 03/26/2016  Elsevier Interactive Patient Education  2019 Elsevier Inc.

## 2018-09-03 NOTE — Assessment & Plan Note (Signed)
Chronic, ongoing.  Followed by cardiology and nephrology.  Continue current medication regimen and collaboration with nephrology and cardiology.  CMP today.

## 2018-09-03 NOTE — Assessment & Plan Note (Signed)
Improved.  Continue to monitor, will resolve issue at this time due to improvement.

## 2018-09-03 NOTE — Assessment & Plan Note (Signed)
Recheck B12 today and continue multivitamin.

## 2018-09-04 LAB — CBC WITH DIFFERENTIAL/PLATELET
Basophils Absolute: 0.1 10*3/uL (ref 0.0–0.2)
Basos: 1 %
EOS (ABSOLUTE): 0.2 10*3/uL (ref 0.0–0.4)
Eos: 4 %
Hemoglobin: 12.7 g/dL (ref 11.1–15.9)
Immature Grans (Abs): 0 10*3/uL (ref 0.0–0.1)
Immature Granulocytes: 0 %
Lymphocytes Absolute: 1.2 10*3/uL (ref 0.7–3.1)
Lymphs: 24 %
MCH: 29.1 pg (ref 26.6–33.0)
MCHC: 31.9 g/dL (ref 31.5–35.7)
MCV: 91 fL (ref 79–97)
Monocytes Absolute: 0.4 10*3/uL (ref 0.1–0.9)
Monocytes: 7 %
Neutrophils Absolute: 3.4 10*3/uL (ref 1.4–7.0)
Neutrophils: 64 %
Platelets: 150 10*3/uL (ref 150–450)
RBC: 4.37 x10E6/uL (ref 3.77–5.28)
RDW: 13.7 % (ref 11.7–15.4)
WBC: 5.2 10*3/uL (ref 3.4–10.8)

## 2018-09-04 LAB — ANEMIA PANEL
Ferritin: 33 ng/mL (ref 15–150)
Folate, Hemolysate: 395 ng/mL
Folate, RBC: 992 ng/mL (ref >498)
Hematocrit: 39.8 % (ref 34.0–46.6)
Iron Saturation: 42 % (ref 15–55)
Iron: 147 ug/dL — ABNORMAL HIGH (ref 27–139)
Retic Ct Pct: 1.3 % (ref 0.6–2.6)
Total Iron Binding Capacity: 346 ug/dL (ref 250–450)
UIBC: 199 ug/dL (ref 118–369)
Vitamin B-12: 344 pg/mL (ref 232–1245)

## 2018-09-04 LAB — COMPREHENSIVE METABOLIC PANEL
ALT: 11 IU/L (ref 0–32)
AST: 14 IU/L (ref 0–40)
Albumin/Globulin Ratio: 2.4 — ABNORMAL HIGH (ref 1.2–2.2)
Albumin: 4.6 g/dL (ref 3.7–4.7)
Alkaline Phosphatase: 83 IU/L (ref 39–117)
BUN/Creatinine Ratio: 9 — ABNORMAL LOW (ref 12–28)
BUN: 12 mg/dL (ref 8–27)
Bilirubin Total: 0.4 mg/dL (ref 0.0–1.2)
CO2: 25 mmol/L (ref 20–29)
Calcium: 9.2 mg/dL (ref 8.7–10.3)
Chloride: 103 mmol/L (ref 96–106)
Creatinine, Ser: 1.32 mg/dL — ABNORMAL HIGH (ref 0.57–1.00)
GFR calc Af Amer: 46 mL/min/{1.73_m2} — ABNORMAL LOW (ref >59)
GFR calc non Af Amer: 40 mL/min/{1.73_m2} — ABNORMAL LOW (ref >59)
Globulin, Total: 1.9 g/dL (ref 1.5–4.5)
Glucose: 95 mg/dL (ref 65–99)
Potassium: 4.5 mmol/L (ref 3.5–5.2)
Sodium: 141 mmol/L (ref 134–144)
Total Protein: 6.5 g/dL (ref 6.0–8.5)

## 2018-09-23 DIAGNOSIS — I4891 Unspecified atrial fibrillation: Secondary | ICD-10-CM | POA: Diagnosis not present

## 2018-09-23 DIAGNOSIS — R0602 Shortness of breath: Secondary | ICD-10-CM | POA: Diagnosis not present

## 2018-09-23 DIAGNOSIS — G4730 Sleep apnea, unspecified: Secondary | ICD-10-CM | POA: Diagnosis not present

## 2018-09-23 DIAGNOSIS — I10 Essential (primary) hypertension: Secondary | ICD-10-CM | POA: Diagnosis not present

## 2018-09-23 DIAGNOSIS — I2510 Atherosclerotic heart disease of native coronary artery without angina pectoris: Secondary | ICD-10-CM | POA: Diagnosis not present

## 2018-09-26 ENCOUNTER — Telehealth: Payer: Self-pay | Admitting: Nurse Practitioner

## 2018-09-26 DIAGNOSIS — I129 Hypertensive chronic kidney disease with stage 1 through stage 4 chronic kidney disease, or unspecified chronic kidney disease: Secondary | ICD-10-CM | POA: Diagnosis not present

## 2018-09-26 DIAGNOSIS — R600 Localized edema: Secondary | ICD-10-CM | POA: Diagnosis not present

## 2018-09-26 DIAGNOSIS — N183 Chronic kidney disease, stage 3 (moderate): Secondary | ICD-10-CM | POA: Diagnosis not present

## 2018-09-26 NOTE — Chronic Care Management (AMB) (Signed)
Chronic Care Management   Note  09/26/2018 Name: Amy Myers MRN: [id number] DOB: Sep 14, 1945  Amy Myers is a 73 y.o. year old female who is a primary care patient of Cannady, Barbaraann Faster, NP. I reached out to Sherlynn Stalls by phone today in response to a referral sent by [name]'s health plan.    [name] was given information about Chronic Care Management services today including:  1. CCM service includes personalized support from designated clinical staff supervised by her physician, including individualized plan of care and coordination with other care providers 2. 24/7 contact phone numbers for assistance for urgent and routine care needs. 3. Service will only be billed when office clinical staff spend 20 minutes or more in a month to coordinate care. 4. Only one practitioner may furnish and bill the service in a calendar month. 5. The patient may stop CCM services at any time (effective at the end of the month) by phone call to the office staff. 6. The patient will be responsible for cost sharing (co-pay) of up to 20% of the service fee (after annual deductible is met).  Patient agreed to services and verbal consent obtained.   Follow up plan: Telephone appointment with CCM team member scheduled for: 10/10/2018  [place]  ??[email or web address]   ??[phone]

## 2018-09-29 DIAGNOSIS — N183 Chronic kidney disease, stage 3 (moderate): Secondary | ICD-10-CM | POA: Diagnosis not present

## 2018-09-29 DIAGNOSIS — R600 Localized edema: Secondary | ICD-10-CM | POA: Diagnosis not present

## 2018-09-29 DIAGNOSIS — I129 Hypertensive chronic kidney disease with stage 1 through stage 4 chronic kidney disease, or unspecified chronic kidney disease: Secondary | ICD-10-CM | POA: Diagnosis not present

## 2018-10-10 ENCOUNTER — Telehealth: Payer: Medicare Other

## 2018-10-24 ENCOUNTER — Ambulatory Visit (INDEPENDENT_AMBULATORY_CARE_PROVIDER_SITE_OTHER): Payer: Medicare Other | Admitting: *Deleted

## 2018-10-24 DIAGNOSIS — N183 Chronic kidney disease, stage 3 unspecified: Secondary | ICD-10-CM

## 2018-10-24 DIAGNOSIS — I129 Hypertensive chronic kidney disease with stage 1 through stage 4 chronic kidney disease, or unspecified chronic kidney disease: Secondary | ICD-10-CM

## 2018-10-24 NOTE — Chronic Care Management (AMB) (Signed)
  Chronic Care Management   Initial Visit Note  10/24/2018 Name: Amy Myers MRN: [id number] DOB: [birth year]  Referred by: Venita Lick, NP Reason for referral : Chronic Care Management (Initial outreach for CCM)   Amy Myers is a 73 y.o. year old female who is a primary care patient of Cannady, Barbaraann Faster, NP. The CCM team was consulted for assistance with chronic disease management and care coordination needs.   Review of patient status, including review of consultants reports, relevant laboratory and other test results, and collaboration with appropriate care team members and the patient's provider was performed as part of comprehensive patient evaluation and provision of chronic care management services.    SDOH (Social Determinants of Health) screening performed today. See Care Plan Entry related to challenges with: None Denies CCM needs at this time. Did discuss fall preventions.  Objective:   Goals Addressed            This Visit's Progress   . Don't want to fall anymore (pt-stated)       Current Barriers:  Marland Kitchen Knowledge Deficits related to fall precautions  Nurse Case Manager Clinical Goal(s):  Marland Kitchen Over the next 1 days, patient will verbalize using fall risk reduction strategies discussed  Interventions:  . Provided written and verbal education re: Potential causes of falls and Fall prevention strategies . Reviewed medications and discussed potential side effects of medications such as dizziness and frequent urination . Assessed [place] to her knee. . Assessed patients knowledge of fall risk prevention secondary to previously provided education. . Patient reports now using cane for fall prevention . Patient denies any further CCM needs  . Patient given CCM contact information incase further needs are identified   Patient Self Care Activities:  . Utilize cane appropriately with all ambulation . De-clutter  walkways . Change positions slowly . Wear secure fitting shoes at all times with ambulation . Utilize home lighting for dim lit areas . Have self and pet awareness at all times     Initial goal documentation         Plan:   The patient has been provided with contact information for the care management team and has been advised to call with any health related questions or concerns.   Merlene Morse Jaloni Sorber RN, BSN Nurse Case Editor, [facility]  [phone]) Business Mobile

## 2018-10-24 NOTE — Patient Instructions (Signed)
Thank you allowing the Chronic Care Management Team to be a part of your care! It was a pleasure speaking with you today!   CCM (Chronic Care Management) Team   Quetzally Callas RN, BSN Nurse Care Coordinator  [phone]  [facility] PharmD  Clinical Pharmacist  [phone]  Eula Fried LCSW Clinical Social Worker [phone]  Goals Addressed            This Visit's Progress   . Don't want to fall anymore (pt-stated)       Current Barriers:  Marland Kitchen Knowledge Deficits related to fall precautions  Nurse Case Manager Clinical Goal(s):  Marland Kitchen Over the next 1 days, patient will verbalize using fall risk reduction strategies discussed  Interventions:  . Provided written and verbal education re: Potential causes of falls and Fall prevention strategies . Reviewed medications and discussed potential side effects of medications such as dizziness and frequent urination . Assessed for falls which [place] post office with a mild injury to her knee. . Assessed patients knowledge of fall risk prevention secondary to previously provided education. . Patient reports now using cane for fall prevention . Patient denies any further CCM needs  . Patient given CCM contact information incase further needs are identified   Patient Self Care Activities:  . Utilize cane appropriately with all ambulation . De-clutter walkways . Change positions slowly . Wear secure fitting shoes at all times with ambulation . Utilize home lighting for dim lit areas . Have self and pet awareness at all times     Initial goal documentation        The patient verbalized understanding of instructions provided today and declined a print copy of patient instruction materials.   The patient has been provided with contact information for the care management team and has been advised to call with any health related questions or concerns.

## 2018-10-27 ENCOUNTER — Other Ambulatory Visit: Payer: Self-pay | Admitting: Nurse Practitioner

## 2018-10-27 DIAGNOSIS — Z1231 Encounter for screening mammogram for malignant neoplasm of breast: Secondary | ICD-10-CM

## 2018-12-03 ENCOUNTER — Ambulatory Visit
Admission: RE | Admit: 2018-12-03 | Discharge: 2018-12-03 | Disposition: A | Discharge status: Home / Self Care | Payer: Medicare Other | Source: Ambulatory Visit | Attending: Nurse Practitioner | Admitting: Nurse Practitioner

## 2018-12-03 DIAGNOSIS — Z1231 Encounter for screening mammogram for malignant neoplasm of breast: Secondary | ICD-10-CM | POA: Insufficient documentation

## 2018-12-24 ENCOUNTER — Ambulatory Visit (INDEPENDENT_AMBULATORY_CARE_PROVIDER_SITE_OTHER): Payer: Medicare Other

## 2018-12-24 VITALS — BP 132/70 | HR 63 | Temp 97.6°F | Resp 16 | Ht 60.5 in | Wt 165.1 lb

## 2018-12-24 DIAGNOSIS — Z0000 Encounter for general adult medical examination without abnormal findings: Secondary | ICD-10-CM
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
## 2018-12-24 NOTE — Patient Instructions (Signed)
Amy Myers , Thank you for taking time to come for your Medicare Wellness Visit. I appreciate your ongoing commitment to your health goals. Please review the following plan we discussed and let me know if I can assist you in the future.   Screening recommendations/referrals: Colonoscopy: completed 07/03/2017 Mammogram: completed 12/03/2018 Bone Density: completed 01/18/2016 Recommended yearly ophthalmology/optometry visit for glaucoma screening and checkup Recommended yearly dental visit for hygiene and checkup  Vaccinations: Influenza vaccine: due now- will get at next appt  Pneumococcal vaccine: up to date Tdap vaccine: up to date Shingles vaccine: shingrix eligible     Advanced directives: copy on file   Conditions/risks identified: hypertension- already in contact with chronic care management team   Next appointment: Follow up in one year for your annual wellness visit.    Preventive Care 74 Years and Older, Female Preventive care refers to lifestyle choices and visits with your health care provider that can promote health and wellness. What does preventive care include?  A yearly physical exam. This is also called an annual well check.  Dental exams once or twice a year.  Routine eye exams. Ask your health care provider how often you should have your eyes checked.  Personal lifestyle choices, including:  Daily care of your teeth and gums.  Regular physical activity.  Eating a healthy diet.  Avoiding tobacco and drug use.  Limiting alcohol use.  Practicing safe sex.  Taking low-dose aspirin every day.  Taking vitamin and mineral supplements as recommended by your health care provider. What happens during an annual well check? The services and screenings done by your health care provider during your annual well check will depend on your age, overall health, lifestyle risk factors, and family history of disease. Counseling  Your health care provider may ask you  questions about your:  Alcohol use.  Tobacco use.  Drug use.  Emotional well-being.  Home and relationship well-being.  Sexual activity.  Eating habits.  History of falls.  Memory and ability to understand (cognition).  Work and work Statistician.  Reproductive health. Screening  You may have the following tests or measurements:  Height, weight, and BMI.  Blood pressure.  Lipid and cholesterol levels. These may be checked every 5 years, or more frequently if you are over 75 years old.  Skin check.  Lung cancer screening. You may have this screening every year starting at age 75 if you have a 30-pack-year history of smoking and currently smoke or have quit within the past 15 years.  Fecal occult blood test (FOBT) of the stool. You may have this test every year starting at age 60.  Flexible sigmoidoscopy or colonoscopy. You may have a sigmoidoscopy every 5 years or a colonoscopy every 10 years starting at age 74.  Hepatitis C blood test.  Hepatitis B blood test.  Sexually transmitted disease (STD) testing.  Diabetes screening. This is done by checking your blood sugar (glucose) after you have not eaten for a while (fasting). You may have this done every 1-3 years.  Bone density scan. This is done to screen for osteoporosis. You may have this done starting at age 74.  Mammogram. This may be done every 1-2 years. Talk to your health care provider about how often you should have regular mammograms. Talk with your health care provider about your test results, treatment options, and if necessary, the need for more tests. Vaccines  Your health care provider may recommend certain vaccines, such as:  Influenza vaccine. This is  recommended every year.  Tetanus, diphtheria, and acellular pertussis (Tdap, Td) vaccine. You may need a Td booster every 10 years.  Zoster vaccine. You may need this after age 42.  Pneumococcal 13-valent conjugate (PCV13) vaccine. One dose is  recommended after age 42.  Pneumococcal polysaccharide (PPSV23) vaccine. One dose is recommended after age 57. Talk to your health care provider about which screenings and vaccines you need and how often you need them. This information is not intended to replace advice given to you by your health care provider. Make sure you discuss any questions you have with your health care provider. Document Released: 04/29/2015 Document Revised: 12/21/2015 Document Reviewed: 02/01/2015 Elsevier Interactive Patient Education  2017 [place] Prevention in the Home Falls can cause injuries. They can happen to people of all ages. There are many things you can do to make your home safe and to help prevent falls. What can I do on the outside of my home?  Regularly fix the edges of walkways and driveways and fix any cracks.  Remove anything that might make you trip as you walk through a door, such as a raised step or threshold.  Trim any bushes or trees on the path to your home.  Use bright outdoor lighting.  Clear any walking paths of anything that might make someone trip, such as rocks or tools.  Regularly check to see if handrails are loose or broken. Make sure that both sides of any steps have handrails.  Any raised decks and porches should have guardrails on the edges.  Have any leaves, snow, or ice cleared regularly.  Use sand or salt on walking paths during winter.  Clean up any spills in your garage right away. This includes oil or grease spills. What can I do in the bathroom?  Use night lights.  Install grab bars by the toilet and in the tub and shower. Do not use towel bars as grab bars.  Use non-skid mats or decals in the tub or shower.  If you need to sit down in the shower, use a plastic, non-slip stool.  Keep the floor dry. Clean up any water that spills on the floor as soon as it happens.  Remove soap buildup in the tub or shower regularly.  Attach bath mats  securely with double-sided non-slip rug tape.  Do not have throw rugs and other things on the floor that can make you trip. What can I do in the bedroom?  Use night lights.  Make sure that you have a light by your bed that is easy to reach.  Do not use any sheets or blankets that are too big for your bed. They should not hang down onto the floor.  Have a firm chair that has side arms. You can use this for support while you get dressed.  Do not have throw rugs and other things on the floor that can make you trip. What can I do in the kitchen?  Clean up any spills right away.  Avoid walking on wet floors.  Keep items that you use a lot in easy-to-reach places.  If you need to reach something above you, use a strong step stool that has a grab bar.  Keep electrical cords out of the way.  Do not use floor polish or wax that makes floors slippery. If you must use wax, use non-skid floor wax.  Do not have throw rugs and other things on the floor that can make you trip.  What can I do with my stairs?  Do not leave any items on the stairs.  Make sure that there are handrails on both sides of the stairs and use them. Fix handrails that are broken or loose. Make sure that handrails are as long as the stairways.  Check any carpeting to make sure that it is firmly attached to the stairs. Fix any carpet that is loose or worn.  Avoid having throw rugs at the top or bottom of the stairs. If you do have throw rugs, attach them to the floor with carpet tape.  Make sure that you have a light switch at the top of the stairs and the bottom of the stairs. If you do not have them, ask someone to add them for you. What else can I do to help prevent falls?  Wear shoes that:  Do not have high heels.  Have rubber bottoms.  Are comfortable and fit you well.  Are closed at the toe. Do not wear sandals.  If you use a stepladder:  Make sure that it is fully opened. Do not climb a closed  stepladder.  Make sure that both sides of the stepladder are locked into place.  Ask someone to hold it for you, if possible.  Clearly mark and make sure that you can see:  Any grab bars or handrails.  First and last steps.  Where the edge of each step is.  Use tools that help you move around (mobility aids) if they are needed. These include:  Canes.  Walkers.  Scooters.  Crutches.  Turn on the lights when you go into a dark area. Replace any light bulbs as soon as they burn out.  Set up your furniture so you have a clear path. Avoid moving your furniture around.  If any of your floors are uneven, fix them.  If there are any pets around you, be aware of where they are.  Review your medicines with your doctor. Some medicines can make you feel dizzy. This can increase your chance of falling. Ask your doctor what other things that you can do to help prevent falls. This information is not intended to replace advice given to you by your health care provider. Make sure you discuss any questions you have with your health care provider. Document Released: 01/27/2009 Document Revised: 09/08/2015 Document Reviewed: 05/07/2014 Elsevier Interactive Patient Education  2017 [employer].

## 2018-12-25 DIAGNOSIS — I10 Essential (primary) hypertension: Secondary | ICD-10-CM | POA: Diagnosis not present

## 2018-12-25 DIAGNOSIS — R0602 Shortness of breath: Secondary | ICD-10-CM | POA: Diagnosis not present

## 2018-12-25 DIAGNOSIS — G4730 Sleep apnea, unspecified: Secondary | ICD-10-CM | POA: Diagnosis not present

## 2018-12-25 DIAGNOSIS — I4891 Unspecified atrial fibrillation: Secondary | ICD-10-CM | POA: Diagnosis not present

## 2018-12-25 DIAGNOSIS — I2510 Atherosclerotic heart disease of native coronary artery without angina pectoris: Secondary | ICD-10-CM | POA: Diagnosis not present

## 2018-12-26 ENCOUNTER — Other Ambulatory Visit: Payer: Self-pay | Admitting: Nurse Practitioner

## 2018-12-30 ENCOUNTER — Other Ambulatory Visit: Payer: Self-pay

## 2018-12-30 ENCOUNTER — Ambulatory Visit (INDEPENDENT_AMBULATORY_CARE_PROVIDER_SITE_OTHER): Payer: Medicare Other | Admitting: Nurse Practitioner

## 2018-12-30 ENCOUNTER — Encounter: Payer: Self-pay | Admitting: Nurse Practitioner

## 2018-12-30 VITALS — BP 119/78 | HR 94 | Temp 98.1°F

## 2018-12-30 DIAGNOSIS — E7800 Pure hypercholesterolemia, unspecified: Secondary | ICD-10-CM | POA: Diagnosis not present

## 2018-12-30 DIAGNOSIS — E039 Hypothyroidism, unspecified: Secondary | ICD-10-CM

## 2018-12-30 DIAGNOSIS — I482 Chronic atrial fibrillation, unspecified: Secondary | ICD-10-CM

## 2018-12-30 DIAGNOSIS — I129 Hypertensive chronic kidney disease with stage 1 through stage 4 chronic kidney disease, or unspecified chronic kidney disease: Secondary | ICD-10-CM | POA: Diagnosis not present

## 2018-12-30 DIAGNOSIS — N183 Chronic kidney disease, stage 3 unspecified: Secondary | ICD-10-CM

## 2018-12-30 DIAGNOSIS — Z23 Encounter for immunization: Secondary | ICD-10-CM

## 2018-12-30 MED ORDER — LEVOTHYROXINE SODIUM 100 MCG PO TABS: 100.0000 ug | ORAL_TABLET | Freq: Every day | ORAL | 3 refills | Status: DC

## 2018-12-30 MED ORDER — RALOXIFENE HCL 60 MG PO TABS: 60.0000 mg | ORAL_TABLET | Freq: Every day | ORAL | 3 refills | Status: DC

## 2018-12-30 NOTE — Patient Instructions (Signed)

## 2018-12-30 NOTE — Assessment & Plan Note (Signed)
Chronic, ongoing.  Continue current medication regimen and adjust as needed.  Recheck thyroid panel today.  ATA guidelines recommend goal for age <6.   

## 2018-12-30 NOTE — Progress Notes (Signed)
 BP 119/78   Pulse 94   Temp 98.1 F (36.7 C) (Oral)   LMP  (LMP Unknown)   SpO2 98%    Subjective:    Patient ID: Amy Myers, female    DOB: 09/16/1945, 73 y.o.   MRN: [id number]  HPI: Amy Myers is a 73 y.o. female  Chief Complaint  Patient presents with  . Hyperlipidemia  . Hypertension  . Hypothyroidism   HYPERTENSION / HYPERLIPIDEMIA & A-FIB Goes to cardiology and last saw last week.  No changes made.  Continues on Metoprolol, Hydralazine, Amlodipine, and Simvastatin + Eliquis.  Is also followed by nephrology, is to see in December. Satisfied with current treatment? yes Duration of hypertension: chronic BP monitoring frequency: daily BP range: 110-130/70 at home BP medication side effects: no Duration of hyperlipidemia: chronic Cholesterol medication side effects: no Cholesterol supplements: none Medication compliance: good compliance Aspirin: no Recent stressors: no Recurrent headaches: no Visual changes: no Palpitations: no Dyspnea: no Chest pain: no Lower extremity edema: no Dizzy/lightheaded: no   ATRIAL FIBRILLATION Atrial fibrillation status: stable Satisfied with current treatment: yes  Medication side effects:  no Medication compliance: good compliance Etiology of atrial fibrillation: unknown Palpitations:  no Chest pain:  no Dyspnea on exertion:  no Orthopnea:  no Syncope:  no Edema:  no Ventricular rate control: B-blocker Anti-coagulation: long acting  HYPOTHYROIDISM Last checked January TSH 0.921.  Continues on Levothyroxine. Thyroid control status:stable Satisfied with current treatment? yes Medication side effects: no Medication compliance: good compliance Etiology of hypothyroidism:  Recent dose adjustment:no Fatigue: no Cold intolerance: no Heat intolerance: no Weight gain: no Weight loss: no Constipation: sometimes Diarrhea/loose stools: no Palpitations: no Lower extremity edema: no Anxiety/depressed mood: no   Relevant past medical, surgical, family and social history reviewed and updated as indicated. Interim medical history since our last visit reviewed. Allergies and medications reviewed and updated.  Review of Systems  Constitutional: Negative for activity change, appetite change, diaphoresis, fatigue and fever.  Respiratory: Negative for cough, chest tightness and shortness of breath.   Cardiovascular: Negative for chest pain, palpitations and leg swelling.  Gastrointestinal: Negative for abdominal distention, abdominal pain, constipation, diarrhea, nausea and vomiting.  Endocrine: Negative for cold intolerance and heat intolerance.  Neurological: Negative for dizziness, syncope, weakness, light-headedness, numbness and headaches.  Psychiatric/Behavioral: Negative.     Per HPI unless specifically indicated above     Objective:    BP 119/78   Pulse 94   Temp 98.1 F (36.7 C) (Oral)   LMP  (LMP Unknown)   SpO2 98%   Wt Readings from Last 3 Encounters:  12/24/18 165 lb 1.6 oz (74.9 kg)  09/03/18 176 lb (79.8 kg)  07/23/18 185 lb (83.9 kg)    Physical Exam Vitals signs and nursing note reviewed.  Constitutional:      General: She is awake. She is not in acute distress.    Appearance: She is well-developed and overweight. She is not ill-appearing.  HENT:     Head: Normocephalic.     Right Ear: Hearing normal.     Left Ear: Hearing normal.  Eyes:     General: Lids are normal.        Right eye: No discharge.        Left eye: No discharge.     Conjunctiva/sclera: Conjunctivae normal.     Pupils: Pupils are equal, round, and reactive to light.  Neck:     Musculoskeletal: Normal range of motion and neck supple.       Thyroid: No thyromegaly.     Vascular: No carotid bruit.  Cardiovascular:     Rate and Rhythm: Normal rate and regular rhythm.     Heart sounds: Normal heart sounds. No murmur. No gallop.   Pulmonary:     Effort: Pulmonary effort is normal. No accessory muscle  usage or respiratory distress.     Breath sounds: Normal breath sounds.  Abdominal:     General: Bowel sounds are normal.     Palpations: Abdomen is soft.  Musculoskeletal:     Right lower leg: No edema.     Left lower leg: No edema.  Skin:    General: Skin is warm and dry.  Neurological:     Mental Status: She is alert and oriented to person, place, and time.  Psychiatric:        Attention and Perception: Attention normal.        Mood and Affect: Mood normal.        Behavior: Behavior normal. Behavior is cooperative.        Thought Content: Thought content normal.        Judgment: Judgment normal.     Results for orders placed or performed in visit on 09/03/18  CBC with Differential/Platelet  Result Value Ref Range   WBC 5.2 3.4 - 10.8 x10E3/uL   RBC 4.37 3.77 - 5.28 x10E6/uL   Hemoglobin 12.7 11.1 - 15.9 g/dL   MCV 91 79 - 97 fL   MCH 29.1 26.6 - 33.0 pg   MCHC 31.9 31.5 - 35.7 g/dL   RDW 13.7 11.7 - 15.4 %   Platelets 150 150 - 450 x10E3/uL   Neutrophils 64 Not Estab. %   Lymphs 24 Not Estab. %   Monocytes 7 Not Estab. %   Eos 4 Not Estab. %   Basos 1 Not Estab. %   Neutrophils Absolute 3.4 1.4 - 7.0 x10E3/uL   Lymphocytes Absolute 1.2 0.7 - 3.1 x10E3/uL   Monocytes Absolute 0.4 0.1 - 0.9 x10E3/uL   EOS (ABSOLUTE) 0.2 0.0 - 0.4 x10E3/uL   Basophils Absolute 0.1 0.0 - 0.2 x10E3/uL   Immature Granulocytes 0 Not Estab. %   Immature Grans (Abs) 0.0 0.0 - 0.1 x10E3/uL  Anemia panel  Result Value Ref Range   Total Iron Binding Capacity 346 250 - 450 ug/dL   UIBC 199 118 - 369 ug/dL   Iron 147 (H) 27 - 139 ug/dL   Iron Saturation 42 15 - 55 %   Vitamin B-12 344 232 - 1,245 pg/mL   Folate, Hemolysate 395.0 Not Estab. ng/mL   Hematocrit 39.8 34.0 - 46.6 %   Folate, RBC 992 >498 ng/mL   Ferritin 33 15 - 150 ng/mL   Retic Ct Pct 1.3 0.6 - 2.6 %  Comp Met (CMET)  Result Value Ref Range   Glucose 95 65 - 99 mg/dL   BUN 12 8 - 27 mg/dL   Creatinine, Ser 1.32 (H) 0.57  - 1.00 mg/dL   GFR calc non Af Amer 40 (L) >59 mL/min/1.73   GFR calc Af Amer 46 (L) >59 mL/min/1.73   BUN/Creatinine Ratio 9 (L) 12 - 28   Sodium 141 134 - 144 mmol/L   Potassium 4.5 3.5 - 5.2 mmol/L   Chloride 103 96 - 106 mmol/L   CO2 25 20 - 29 mmol/L   Calcium 9.2 8.7 - 10.3 mg/dL   Total Protein 6.5 6.0 - 8.5 g/dL   Albumin 4.6 3.7 - 4.7 g/dL  Globulin, Total 1.9 1.5 - 4.5 g/dL   Albumin/Globulin Ratio 2.4 (H) 1.2 - 2.2   Bilirubin Total 0.4 0.0 - 1.2 mg/dL   Alkaline Phosphatase 83 39 - 117 IU/L   AST 14 0 - 40 IU/L   ALT 11 0 - 32 IU/L      Assessment & Plan:   Problem List Items Addressed This Visit      Cardiovascular and Mediastinum   Chronic atrial fibrillation    Chronic, rate controlled.  Continue current medication regimen and collaboration with cardiology (Dr. Humphrey Rolls).        Benign hypertension with chronic kidney disease, stage III (HCC) - Primary    Chronic, ongoing.  Followed by cardiology and nephrology.  Continue current medication regimen, BP at goal, and adjust as needed.  Continue collaboration with cardiologist and nephrologist.        Endocrine   Hypothyroidism    Chronic, ongoing.  Continue current medication regimen and adjust as needed.  Recheck thyroid panel today.  ATA guidelines recommend goal for age <6.        Relevant Medications   levothyroxine (SYNTHROID) 100 MCG tablet   Other Relevant Orders   Thyroid Panel With TSH     Other   Hyperlipidemia    Chronic, ongoing.  Continue current medication regimen and adjust as needed.         Other Visit Diagnoses    Need for influenza vaccination       Relevant Orders   Flu Vaccine QUAD High Dose(Fluad) (Completed)       Follow up plan: Return in about 6 months (around 06/29/2019) for HTN/HLD, Hypothyroidism.

## 2018-12-30 NOTE — Assessment & Plan Note (Signed)
Chronic, rate controlled.  Continue current medication regimen and collaboration with cardiology (Dr. Khan).   ?

## 2018-12-30 NOTE — Assessment & Plan Note (Signed)
Chronic, ongoing.  Followed by cardiology and nephrology.  Continue current medication regimen, BP at goal, and adjust as needed.  Continue collaboration with cardiologist and nephrologist.

## 2018-12-30 NOTE — Assessment & Plan Note (Signed)
Chronic, ongoing.  Continue current medication regimen and adjust as needed.   

## 2018-12-31 LAB — THYROID PANEL WITH TSH
Free Thyroxine Index: 3.6 (ref 1.2–4.9)
T3 Uptake Ratio: 30 % (ref 24–39)
T4, Total: 11.9 ug/dL (ref 4.5–12.0)
TSH: 0.288 u[IU]/mL — ABNORMAL LOW (ref 0.450–4.500)

## 2019-01-01 ENCOUNTER — Other Ambulatory Visit: Payer: Self-pay | Admitting: Nurse Practitioner

## 2019-01-01 DIAGNOSIS — E039 Hypothyroidism, unspecified: Secondary | ICD-10-CM

## 2019-01-01 MED ORDER — LEVOTHYROXINE SODIUM 75 MCG PO TABS: 75.0000 ug | ORAL_TABLET | Freq: Every day | ORAL | 3 refills | Status: DC

## 2019-01-01 NOTE — Progress Notes (Signed)
Dose change due to low TSH level.  Reduction to 75 MCG (was on 100) and will recheck level in 6 weeks.

## 2019-02-13 ENCOUNTER — Other Ambulatory Visit: Payer: Self-pay

## 2019-02-13 ENCOUNTER — Other Ambulatory Visit: Payer: Medicare Other

## 2019-02-13 DIAGNOSIS — E039 Hypothyroidism, unspecified: Secondary | ICD-10-CM

## 2019-02-14 LAB — THYROID PANEL WITH TSH
Free Thyroxine Index: 2.7 (ref 1.2–4.9)
T3 Uptake Ratio: 28 % (ref 24–39)
T4, Total: 9.6 ug/dL (ref 4.5–12.0)
TSH: 3.05 u[IU]/mL (ref 0.450–4.500)

## 2019-03-26 DIAGNOSIS — I129 Hypertensive chronic kidney disease with stage 1 through stage 4 chronic kidney disease, or unspecified chronic kidney disease: Secondary | ICD-10-CM | POA: Diagnosis not present

## 2019-03-26 DIAGNOSIS — N183 Chronic kidney disease, stage 3 unspecified: Secondary | ICD-10-CM | POA: Diagnosis not present

## 2019-03-26 DIAGNOSIS — R600 Localized edema: Secondary | ICD-10-CM | POA: Diagnosis not present

## 2019-04-02 DIAGNOSIS — N1832 Chronic kidney disease, stage 3b: Secondary | ICD-10-CM | POA: Diagnosis not present

## 2019-04-02 DIAGNOSIS — I10 Essential (primary) hypertension: Secondary | ICD-10-CM | POA: Diagnosis not present

## 2019-04-06 DIAGNOSIS — I4891 Unspecified atrial fibrillation: Secondary | ICD-10-CM | POA: Diagnosis not present

## 2019-04-06 DIAGNOSIS — I509 Heart failure, unspecified: Secondary | ICD-10-CM | POA: Diagnosis not present

## 2019-04-06 DIAGNOSIS — I2510 Atherosclerotic heart disease of native coronary artery without angina pectoris: Secondary | ICD-10-CM | POA: Diagnosis not present

## 2019-04-06 DIAGNOSIS — G4730 Sleep apnea, unspecified: Secondary | ICD-10-CM | POA: Diagnosis not present

## 2019-05-05 DIAGNOSIS — N179 Acute kidney failure, unspecified: Secondary | ICD-10-CM | POA: Diagnosis not present

## 2019-05-06 ENCOUNTER — Other Ambulatory Visit: Payer: Self-pay | Admitting: Nurse Practitioner

## 2019-05-06 DIAGNOSIS — N179 Acute kidney failure, unspecified: Secondary | ICD-10-CM | POA: Diagnosis not present

## 2019-05-06 NOTE — Telephone Encounter (Signed)
Appointment 07/01/19- refilled per protocol

## 2019-06-20 IMAGING — MG MM DIGITAL SCREENING BILAT W/ TOMO W/ CAD
6 of 10 series · 6 of 30 positions shown · non-contrast
Comparison: Previous exam(s).

CLINICAL DATA: Screening.

EXAM:
DIGITAL SCREENING BILATERAL MAMMOGRAM WITH TOMO AND CAD

[R CC synth-2D]
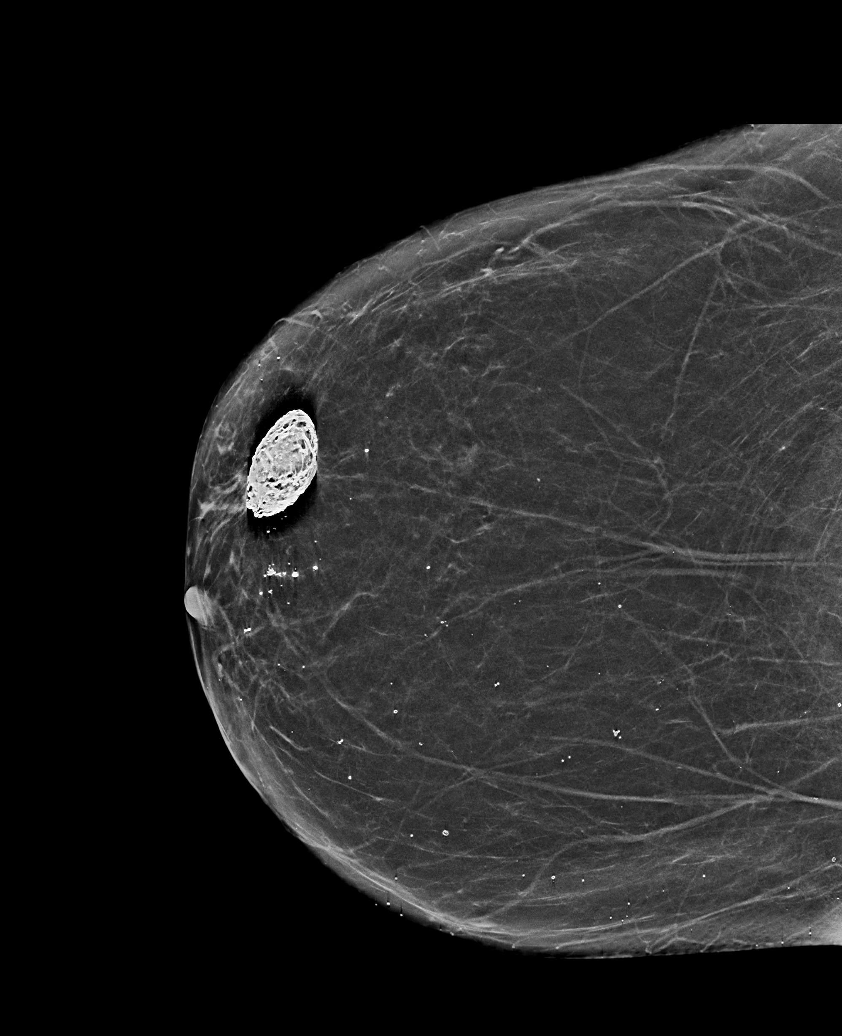

[L CC synth-2D (1 of 2)]
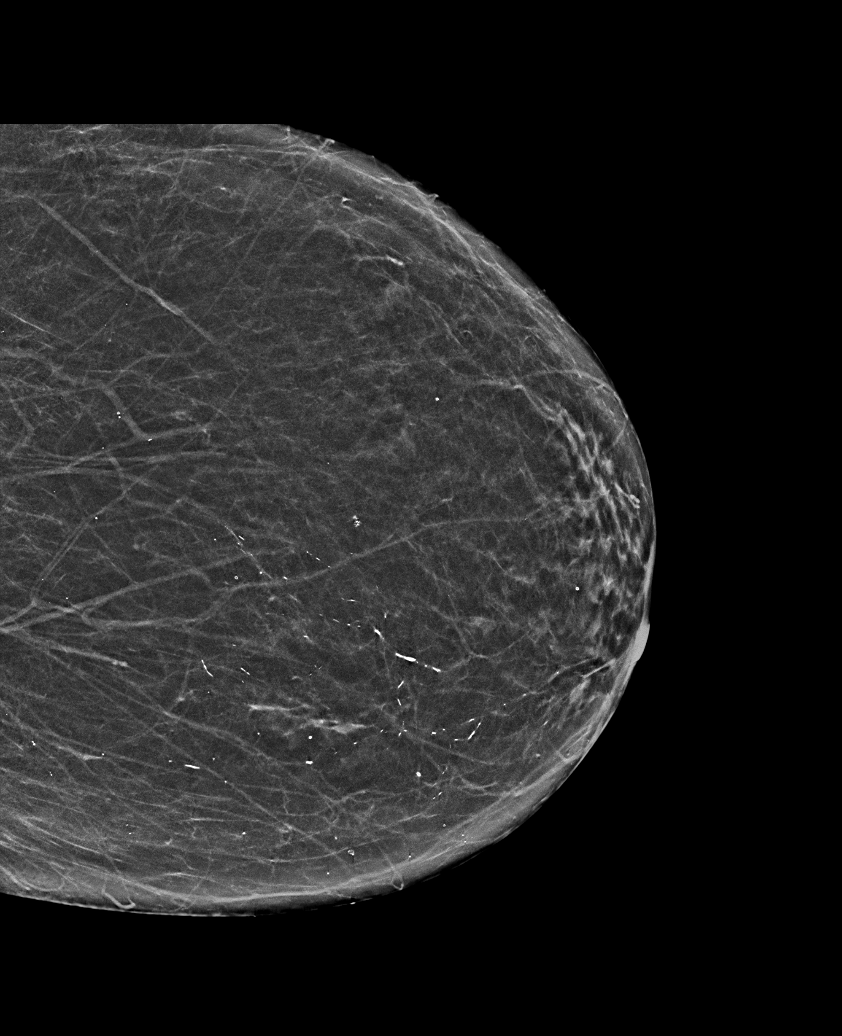

[L CC synth-2D (2 of 2)]
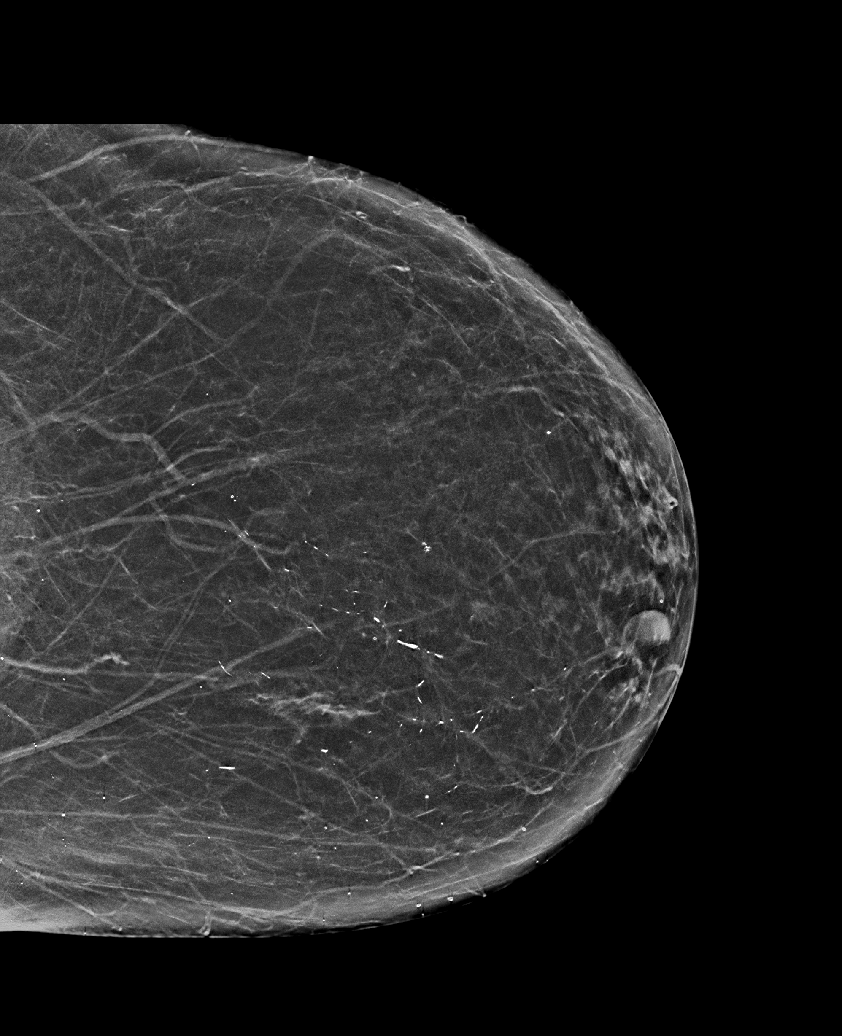

[L MLO synth-2D]
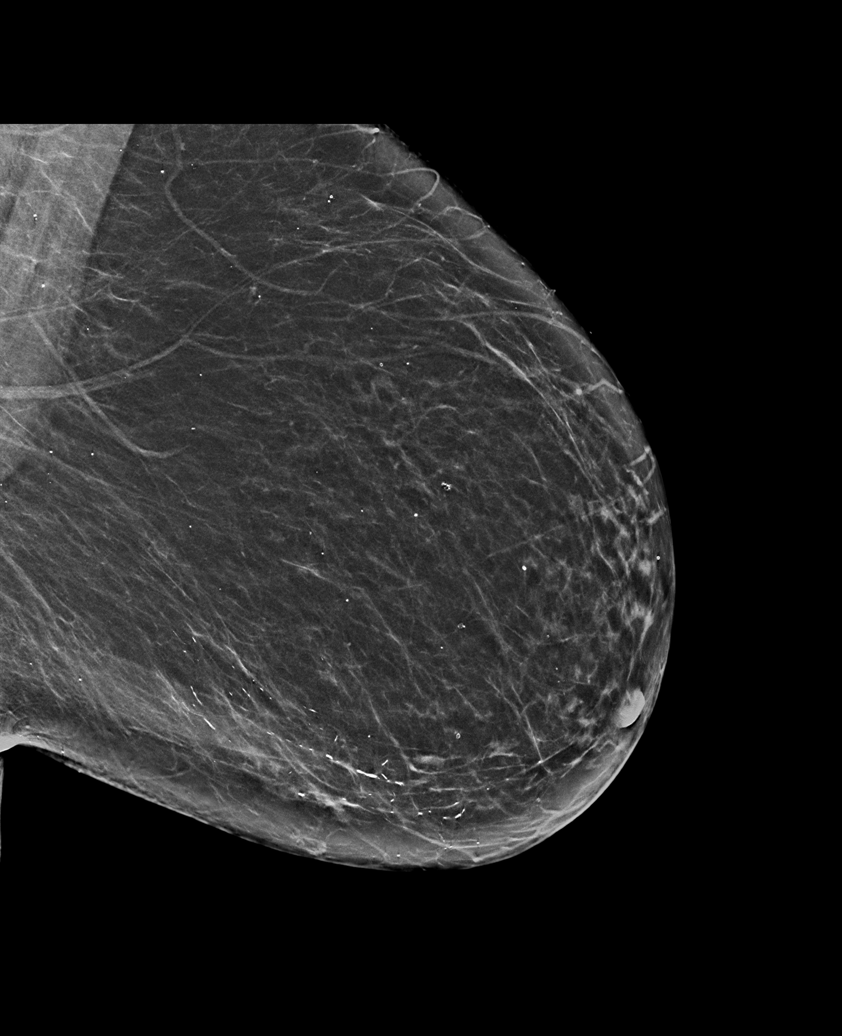

[R MLO synth-2D]
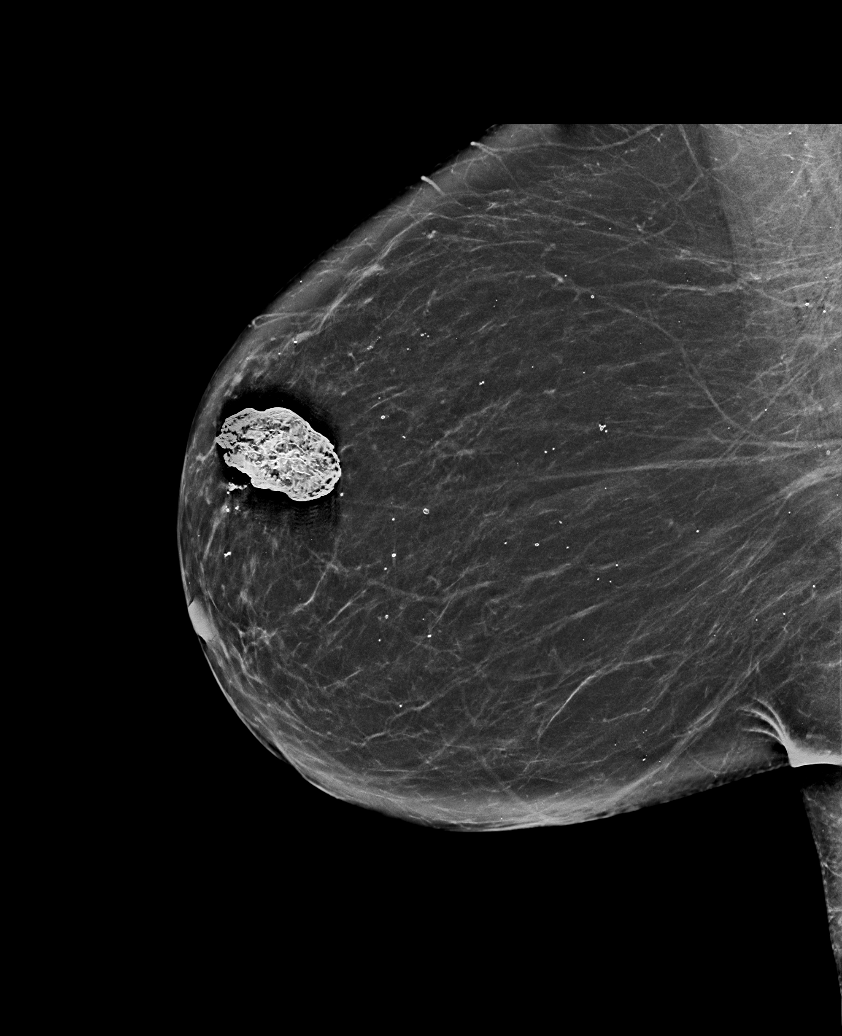

[R MLO tomo · tomo slice 35/70.0]
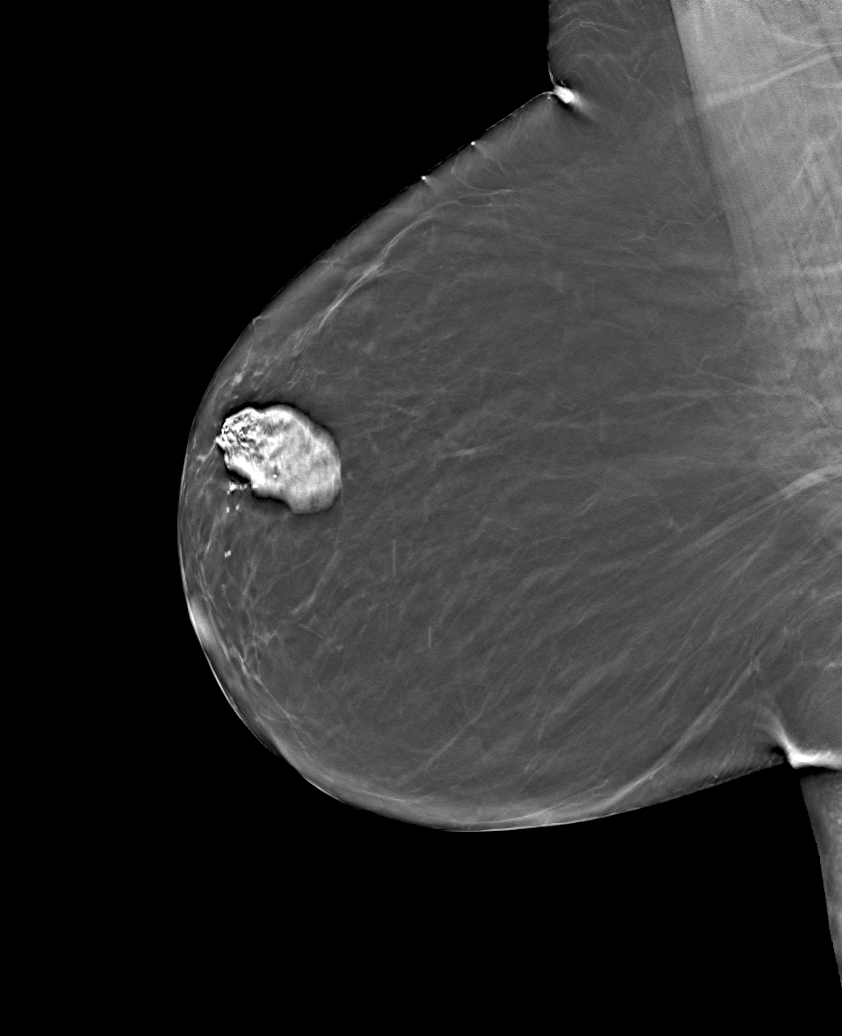

[6 of 30 positions shown; findings below may reference images not displayed]

ACR Breast Density Category b: There are scattered areas of
fibroglandular density.
FINDINGS: There are no findings suspicious for malignancy. Images were
processed with CAD.
IMPRESSION: No mammographic evidence of malignancy. A result letter of this
screening mammogram will be mailed directly to the patient.

RECOMMENDATION:
Screening mammogram in one year. (Code:[id number])

BI-RADS CATEGORY  1: Negative.

## 2019-07-01 ENCOUNTER — Other Ambulatory Visit: Payer: Self-pay

## 2019-07-01 ENCOUNTER — Encounter: Payer: Self-pay | Admitting: Nurse Practitioner

## 2019-07-01 ENCOUNTER — Ambulatory Visit (INDEPENDENT_AMBULATORY_CARE_PROVIDER_SITE_OTHER): Payer: Medicare Other | Admitting: Nurse Practitioner

## 2019-07-01 VITALS — BP 139/83 | HR 82 | Temp 97.6°F | Ht 59.06 in | Wt 178.6 lb

## 2019-07-01 DIAGNOSIS — Z7901 Long term (current) use of anticoagulants: Secondary | ICD-10-CM

## 2019-07-01 DIAGNOSIS — I10 Essential (primary) hypertension: Secondary | ICD-10-CM

## 2019-07-01 DIAGNOSIS — G8929 Other chronic pain: Secondary | ICD-10-CM | POA: Insufficient documentation

## 2019-07-01 DIAGNOSIS — E559 Vitamin D deficiency, unspecified: Secondary | ICD-10-CM

## 2019-07-01 DIAGNOSIS — M85851 Other specified disorders of bone density and structure, right thigh: Secondary | ICD-10-CM

## 2019-07-01 DIAGNOSIS — M25551 Pain in right hip: Secondary | ICD-10-CM

## 2019-07-01 DIAGNOSIS — Z6836 Body mass index (BMI) 36.0-36.9, adult: Secondary | ICD-10-CM | POA: Diagnosis not present

## 2019-07-01 DIAGNOSIS — D508 Other iron deficiency anemias: Secondary | ICD-10-CM | POA: Diagnosis not present

## 2019-07-01 DIAGNOSIS — M109 Gout, unspecified: Secondary | ICD-10-CM

## 2019-07-01 DIAGNOSIS — I482 Chronic atrial fibrillation, unspecified: Secondary | ICD-10-CM

## 2019-07-01 DIAGNOSIS — E538 Deficiency of other specified B group vitamins: Secondary | ICD-10-CM

## 2019-07-01 DIAGNOSIS — E7800 Pure hypercholesterolemia, unspecified: Secondary | ICD-10-CM

## 2019-07-01 DIAGNOSIS — N1832 Chronic kidney disease, stage 3b: Secondary | ICD-10-CM

## 2019-07-01 DIAGNOSIS — E079 Disorder of thyroid, unspecified: Secondary | ICD-10-CM | POA: Diagnosis not present

## 2019-07-01 DIAGNOSIS — D369 Benign neoplasm, unspecified site: Secondary | ICD-10-CM | POA: Insufficient documentation

## 2019-07-01 DIAGNOSIS — D510 Vitamin B12 deficiency anemia due to intrinsic factor deficiency: Secondary | ICD-10-CM | POA: Diagnosis not present

## 2019-07-01 MED ORDER — ALLOPURINOL 100 MG PO TABS: 100.0000 mg | ORAL_TABLET | Freq: Two times a day (BID) | ORAL | 3 refills | Status: DC

## 2019-07-01 NOTE — Assessment & Plan Note (Signed)
Chronic, rate controlled.  Continue current medication regimen and collaboration with cardiology (Dr. Khan).   ?

## 2019-07-01 NOTE — Assessment & Plan Note (Signed)
Ongoing.  Recheck B12 today and continue multivitamin + daily B12 orally. 

## 2019-07-01 NOTE — Assessment & Plan Note (Signed)
Chronic, ongoing.  Continue current medication regimen and adjust as needed.  Recheck thyroid panel today.  ATA guidelines recommend goal for age <6.   

## 2019-07-01 NOTE — Assessment & Plan Note (Signed)
Noted on 2017 DEXA, need for repeat in 2022.  Continue supplements at home and gentle weight bearing exercises.  Check Vit D today.

## 2019-07-01 NOTE — Assessment & Plan Note (Signed)
Chronic, stable with no recent flares.  Continue Allopurinol and check uric acid level today.  Consider decreasing dose to 100 MG daily due to renal function, review with patient after labs return. 

## 2019-07-01 NOTE — Assessment & Plan Note (Signed)
Recommend continued focus on healthy diet choices and regular physical activity (30 minutes 5 days a week).  Focus on small goals at a time and set timelines to achieve.

## 2019-07-01 NOTE — Assessment & Plan Note (Signed)
Chronic, ongoing with a-fib.  Continue current medication regimen and check CBC today.  Continue collaboration with cardiology.

## 2019-07-01 NOTE — Progress Notes (Signed)
BP 139/83 (BP Location: Left Arm, Patient Position: Sitting, Cuff Size: Normal)   Pulse 82   Temp 97.6 F (36.4 C) (Oral)   Ht 4' 11.06" (1.5 m)   Wt 178 lb 9.6 oz (81 kg)   LMP  (LMP Unknown)   SpO2 99%   BMI 36.01 kg/m    Subjective:    Patient ID: Amy Myers, female    DOB: 01-05-1946, 74 y.o.   MRN: [id number]  HPI: Amy Myers is a 74 y.o. female  Chief Complaint  Patient presents with  . Hypertension  . Hyperlipidemia  . Hypothyroidism   HYPERTENSION / HYPERLIPIDEMIA & A-FIB Goes to cardiology and last saw in September 2020 and has visit with him next week.  No changes made.  Continues on Metoprolol, Hydralazine, Amlodipine, and Simvastatin + Eliquis.  Is also followed by nephrology, saw in December. Satisfied with current treatment? yes Duration of hypertension: chronic BP monitoring frequency: daily BP range: 130/80 range at home BP medication side effects: no Duration of hyperlipidemia: chronic Cholesterol medication side effects: no Cholesterol supplements: none Medication compliance: good compliance Aspirin: no Recent stressors: no Recurrent headaches: no Visual changes: no Palpitations: no Dyspnea: no Chest pain: no Lower extremity edema: no Dizzy/lightheaded: no   CHRONIC KIDNEY DISEASE/ANEMIA/GOUT Saw nephrology in December with CRT 1.28 and GFR 41, remaining baseline.  She was noted to have some iron deficiency and B12 anemia after a fall last year, iron levels improved with supplement and she is no longer taking this.  Is taking B12 daily.  Denies any anemia symptoms.    Has underlying gout and takes Allopurinol daily, no recent flares. CKD status: stable Medications renally dose: yes Previous renal evaluation: yes Pneumovax:  Up to Date Influenza Vaccine:  Up to Date  OSTEOPENIA Taking Vit D and Calcium at home.  Last DEXA in 2017, due next 2022. Satisfied with current treatment?: yes Past osteoporosis medications/treatments:    Adequate calcium & vitamin D: yes Weight bearing exercises: yes  ATRIAL FIBRILLATION Atrial fibrillation status: stable Satisfied with current treatment: yes  Medication side effects:  no Medication compliance: good compliance Etiology of atrial fibrillation: unknown Palpitations:  no Chest pain:  no Dyspnea on exertion:  no Orthopnea:  no Syncope:  no Edema:  no Ventricular rate control: B-blocker Anti-coagulation: long acting  HYPOTHYROIDISM Last checked January TSH 3.050, T4 9.6.  Continues on Levothyroxine 75 MCG. Thyroid control status:stable Satisfied with current treatment? yes Medication side effects: no Medication compliance: good compliance Etiology of hypothyroidism:  Recent dose adjustment:no Fatigue: no Cold intolerance: no Heat intolerance: no Weight gain: no Weight loss: no Constipation: sometimes Diarrhea/loose stools: no Palpitations: no Lower extremity edema: no Anxiety/depressed mood: no   HIP PAIN To right hip, started hurting last year but has become worse in spells.   Duration: months Involved hip: right  Mechanism of injury: unknown Location: posterior Onset: gradual  Severity: 6/10  Quality: dull and aching Frequency: intermittent Radiation: no Aggravating factors: walking and movement   Alleviating factors: APAP  Status: fluctuating Treatments attempted: heat and APAP   Relief with NSAIDs?: no Weakness with weight bearing: no Weakness with walking: no Paresthesias / decreased sensation: no Swelling: no Redness:no Fevers: no  Relevant past medical, surgical, family and social history reviewed and updated as indicated. Interim medical history since our last visit reviewed. Allergies and medications reviewed and updated.  Review of Systems  Constitutional: Negative for activity change, appetite change, diaphoresis, fatigue and fever.  Respiratory:  Negative for cough, chest tightness and shortness of breath.   Cardiovascular:  Negative for chest pain, palpitations and leg swelling.  Gastrointestinal: Negative for abdominal distention, abdominal pain, constipation, diarrhea, nausea and vomiting.  Endocrine: Negative for cold intolerance and heat intolerance.  Neurological: Negative for dizziness, syncope, weakness, light-headedness, numbness and headaches.  Psychiatric/Behavioral: Negative.     Per HPI unless specifically indicated above     Objective:    BP 139/83 (BP Location: Left Arm, Patient Position: Sitting, Cuff Size: Normal)   Pulse 82   Temp 97.6 F (36.4 C) (Oral)   Ht 4' 11.06" (1.5 m)   Wt 178 lb 9.6 oz (81 kg)   LMP  (LMP Unknown)   SpO2 99%   BMI 36.01 kg/m   Wt Readings from Last 3 Encounters:  07/01/19 178 lb 9.6 oz (81 kg)  12/24/18 165 lb 1.6 oz (74.9 kg)  09/03/18 176 lb (79.8 kg)    Physical Exam Vitals and nursing note reviewed.  Constitutional:      General: She is awake. She is not in acute distress.    Appearance: She is well-developed and overweight. She is not ill-appearing.  HENT:     Head: Normocephalic.     Right Ear: Hearing normal.     Left Ear: Hearing normal.  Eyes:     General: Lids are normal.        Right eye: No discharge.        Left eye: No discharge.     Conjunctiva/sclera: Conjunctivae normal.     Pupils: Pupils are equal, round, and reactive to light.  Neck:     Thyroid: No thyromegaly.     Vascular: No carotid bruit.  Cardiovascular:     Rate and Rhythm: Normal rate. Rhythm irregularly irregular.     Heart sounds: Normal heart sounds. No murmur. No gallop.   Pulmonary:     Effort: Pulmonary effort is normal. No accessory muscle usage or respiratory distress.     Breath sounds: Normal breath sounds.  Abdominal:     General: Bowel sounds are normal.     Palpations: Abdomen is soft.  Musculoskeletal:     Cervical back: Normal range of motion and neck supple.     Right hip: Crepitus present. No deformity, lacerations or tenderness. Normal range  of motion. Normal strength.     Left hip: Normal.     Right lower leg: No edema.     Left lower leg: No edema.  Skin:    General: Skin is warm and dry.  Neurological:     Mental Status: She is alert and oriented to person, place, and time.  Psychiatric:        Attention and Perception: Attention normal.        Mood and Affect: Mood normal.        Behavior: Behavior normal. Behavior is cooperative.        Thought Content: Thought content normal.        Judgment: Judgment normal.    Hip Exam: bilateral     Tenderness to palpation: none     Greater trochanter: no      Anterior superior iliac spine: no     Anterior hip: no     Iliac crest: no     Iliac tubercle: no     Pubic tubercle: no     SI joint: no      Range of Motion: Full ROM    Flexion: Normal  Extension: Normal    Abduction: Normal    Adduction: Normal    Internal rotation: Normal with mild tenderness     External rotation: Normal     Muscle Strength:  5/5 bilaterally    Flexion:  5/5    Extension:  5/5    Abductors:  5/5    Adductors:  5/5     Special Tests:    Ober test: negative    FABER test:negative   Results for orders placed or performed in visit on 02/13/19  Thyroid Panel With TSH  Result Value Ref Range   TSH 3.050 0.450 - 4.500 uIU/mL   T4, Total 9.6 4.5 - 12.0 ug/dL   T3 Uptake Ratio 28 24 - 39 %   Free Thyroxine Index 2.7 1.2 - 4.9      Assessment & Plan:   Problem List Items Addressed This Visit      Cardiovascular and Mediastinum   Chronic atrial fibrillation (HCC) - Primary    Chronic, rate controlled.  Continue current medication regimen and collaboration with cardiology (Dr. Humphrey Rolls).        Hypertension    Chronic, ongoing with BP slightly elevated at visit today, but at goal on home readings majority of time.  Followed by cardiology and nephrology.  Continue current medication regimen and adjust as needed.  Continue collaboration with cardiologist and nephrologist.  Had recent  kidney function labs.        Endocrine   Thyroid disease    Chronic, ongoing.  Continue current medication regimen and adjust as needed.  Recheck thyroid panel today.  ATA guidelines recommend goal for age <6.        Relevant Orders   Thyroid Panel With TSH     Musculoskeletal and Integument   Osteopenia    Noted on 2017 DEXA, need for repeat in 2022.  Continue supplements at home and gentle weight bearing exercises.  Check Vit D today.      Relevant Orders   VITAMIN D 25 Hydroxy (Vit-D Deficiency, Fractures)     Genitourinary   Stage 3 chronic kidney disease    Chronic, stable with no decline noted on recent nephrology labs.  Continue collaboration with nephrology.  No current ACE or ARB, may benefit from this in future if no past side effects with these.          Other   Morbid obesity ([place])    BMI 36.01 with a-fib and HTN + CKD.  Recommend continued focus on healthy diet choices and regular physical activity (30 minutes 5 days a week).       Hyperlipidemia    Chronic, ongoing.  Continue current medication regimen and adjust as needed based on labs.  Lipid panel today.      Relevant Orders   Lipid Panel w/o Chol/HDL Ratio   Gout    Chronic, stable with no recent flares.  Continue Allopurinol and check uric acid level today.  Consider decreasing dose to 100 MG daily due to renal function, review with patient after labs return.      Relevant Orders   Uric acid   Vitamin B12 deficiency    Ongoing.  Recheck B12 today and continue multivitamin + daily B12 orally.      Relevant Orders   Vitamin B12   CBC with Differential/Platelet   Iron deficiency anemia    Chronic, stable.  No longer on supplement.  Recheck iron and ferritin today.      Relevant Orders  Iron, TIBC and Ferritin Panel   CBC with Differential/Platelet   Long term (current) use of anticoagulants    Chronic, ongoing with a-fib.  Continue current medication regimen and check CBC today.  Continue  collaboration with cardiology.      BMI 36.0-36.9,adult    Recommend continued focus on healthy diet choices and regular physical activity (30 minutes 5 days a week).  Focus on small goals at a time and set timelines to achieve.       Chronic right hip pain    Ongoing, intermittent.  She declines imaging.  Suspect OA.  Recommend continue use of Tylenol as needed.  May use OTC Icy/Hot Lidocaine patches or Voltaren gel.  Avoid use of Ibuprofen products.  Continue use of heat as needed. Consider PT or imaging if worsening or ongoing.  Return to office if worsening symptoms.       Other Visit Diagnoses    Vitamin D deficiency       Reports history of low levels, check today and continue supplement.  Has underlying osteopenia.   Relevant Orders   VITAMIN D 25 Hydroxy (Vit-D Deficiency, Fractures)       Follow up plan: Return in about 6 months (around 01/01/2020) for Thyroid, HTN/HLD. CKD, Gout.

## 2019-07-01 NOTE — Assessment & Plan Note (Signed)
BMI 36.01 with a-fib and HTN + CKD.  Recommend continued focus on healthy diet choices and regular physical activity (30 minutes 5 days a week).

## 2019-07-01 NOTE — Patient Instructions (Signed)
Piriformis Syndrome  Piriformis syndrome is a condition that can cause pain and numbness in your buttocks and down the back of your leg. Piriformis syndrome happens when the small muscle that connects the base of your spine to your hip (piriformis muscle) presses on the nerve that runs down the back of your leg (sciatic nerve). The piriformis muscle helps your hip rotate and helps to bring your leg back and out. It also helps shift your weight to keep you stable while you are walking. The sciatic nerve runs under or through the piriformis muscle. Damage to the piriformis muscle can cause spasms that put pressure on the nerve below. This causes pain and discomfort while sitting and moving. The pain may feel as if it begins in the buttock and spreads (radiates) down your hip and thigh. What are the causes? This condition is caused by pressure on the sciatic nerve from the piriformis muscle. The piriformis muscle can get irritated with overuse, especially if other hip muscles are weak and the piriformis muscle has to do extra work. Piriformis syndrome can also occur after an injury, like a fall onto your buttocks. What increases the risk? You are more likely to develop this condition if you:  Are a woman.  Sit for long periods of time.  Are a cyclist.  Have weak buttocks muscles (gluteal muscles). What are the signs or symptoms? Symptoms of this condition include:  Pain, tingling, or numbness that starts in the buttock and runs down the back of your leg (sciatica).  Pain in the groin or thigh area. Your symptoms may get worse:  The longer you sit.  When you walk, run, or climb stairs.  When straining to have a bowel movement. How is this diagnosed? This condition is diagnosed based on your symptoms, medical history, and physical exam.  During the exam, your health care provider may: ? Move your leg into different positions to check for pain. ? Press on the muscles of your hip and  buttock to see if that increases your symptoms.  You may also have tests, including: ? Imaging tests such as X-rays, MRI, or ultrasound. ? Electromyogram (EMG). This test measures electrical signals sent by your nerves into the muscles. ? Nerve conduction study. This test measures how well electrical signals pass through your nerves. How is this treated? This condition may be treated by:  Stopping all activities that cause pain or make your condition worse.  Applying ice or using heat therapy.  Taking medicines to reduce pain and swelling.  Taking a muscle relaxer (muscle relaxant) to stop muscle spasms.  Doing range-of-motion and strengthening exercises (physical therapy) as told by your health care provider.  Massaging the area.  Having acupuncture.  Getting an injection of medicine in the piriformis muscle. Your health care provider will choose the medicine based on your condition. He or she may inject: ? An anti-inflammatory medicine (steroid) to reduce swelling. ? A numbing medicine (local anesthetic) to block the pain. ? Botulinum toxin. The toxin blocks nerve impulses to specific muscles to reduce muscle tension. In rare cases, you may need surgery to cut the muscle and release pressure on the nerve if other treatments do not work. Follow these instructions at home: Activity  Do not sit for long periods. Get up and walk around every 20 minutes or as often as told by your health care provider. ? When driving long distances, make sure to take frequent stops to get up and stretch.  Use a  cushion when you sit on hard surfaces.  Do exercises as told by your health care provider.  Return to your normal activities as told by your health care provider. Ask your health care provider what activities are safe for you. Managing pain, stiffness, and swelling      If directed, apply heat to the affected area as often as told by your health care provider. Use the heat source that  your health care provider recommends, such as a moist heat pack or a heating pad. ? Place a towel between your skin and the heat source. ? Leave the heat on for 20-30 minutes. ? Remove the heat if your skin turns bright red. This is especially important if you are unable to feel pain, heat, or cold. You may have a greater risk of getting burned.  If directed, put ice on the injured area. ? Put ice in a plastic bag. ? Place a towel between your skin and the bag. ? Leave the ice on for 20 minutes, 2-3 times a day. General instructions  Take over-the-counter and prescription medicines only as told by your health care provider.  Ask your health care provider if the medicine prescribed to you requires you to avoid driving or using heavy machinery.  You may need to take actions to prevent or treat constipation, such as: ? Drink enough fluid to keep your urine pale yellow. ? Take over-the-counter or prescription medicines. ? Eat foods that are high in fiber, such as beans, whole grains, and fresh fruits and vegetables. ? Limit foods that are high in fat and processed sugars, such as fried or sweet foods.  Keep all follow-up visits as told by your health care provider. This is important. How is this prevented?  Do not sit for longer than 20 minutes at a time. When you sit, choose padded surfaces.  Warm up and stretch before being active.  Cool down and stretch after being active.  Give your body time to rest between periods of activity.  Make sure to use equipment that fits you.  Maintain physical fitness, including: ? Strength. ? Flexibility. Contact a health care provider if:  Your pain and stiffness continue or get worse.  Your leg or hip becomes weak.  You have changes in your bowel function or bladder function. Summary  Piriformis syndrome is a condition that can cause pain, tingling, and numbness in your buttocks and down the back of your leg.  You may try applying heat  or ice to relieve the pain.  Do not sit for long periods. Get up and walk around every 20 minutes or as often as told by your health care provider. This information is not intended to replace advice given to you by your health care provider. Make sure you discuss any questions you have with your health care provider. Document Revised: 07/24/2018 Document Reviewed: 11/27/2017 Elsevier Patient Education  2020 Elsevier Inc.  

## 2019-07-01 NOTE — Assessment & Plan Note (Signed)
Chronic, stable.  No longer on supplement.  Recheck iron and ferritin today.

## 2019-07-01 NOTE — Assessment & Plan Note (Signed)
Chronic, ongoing.  Continue current medication regimen and adjust as needed based on labs. Lipid panel today. 

## 2019-07-01 NOTE — Assessment & Plan Note (Signed)
Chronic, ongoing with BP slightly elevated at visit today, but at goal on home readings majority of time.  Followed by cardiology and nephrology.  Continue current medication regimen and adjust as needed.  Continue collaboration with cardiologist and nephrologist.  Had recent kidney function labs.

## 2019-07-01 NOTE — Assessment & Plan Note (Signed)
Chronic, stable with no decline noted on recent nephrology labs.  Continue collaboration with nephrology.  No current ACE or ARB, may benefit from this in future if no past side effects with these.

## 2019-07-01 NOTE — Assessment & Plan Note (Signed)
Ongoing, intermittent.  She declines imaging.  Suspect OA.  Recommend continue use of Tylenol as needed.  May use OTC Icy/Hot Lidocaine patches or Voltaren gel.  Avoid use of Ibuprofen products.  Continue use of heat as needed. Consider PT or imaging if worsening or ongoing.  Return to office if worsening symptoms.

## 2019-07-02 ENCOUNTER — Encounter: Payer: Self-pay | Admitting: Nurse Practitioner

## 2019-07-02 DIAGNOSIS — E559 Vitamin D deficiency, unspecified: Secondary | ICD-10-CM | POA: Insufficient documentation

## 2019-07-02 LAB — THYROID PANEL WITH TSH
Free Thyroxine Index: 2.9 (ref 1.2–4.9)
T3 Uptake Ratio: 28 % (ref 24–39)
T4, Total: 10.5 ug/dL (ref 4.5–12.0)
TSH: 3.51 u[IU]/mL (ref 0.450–4.500)

## 2019-07-02 LAB — CBC WITH DIFFERENTIAL/PLATELET
Basophils Absolute: 0.1 10*3/uL (ref 0.0–0.2)
Basos: 1 %
EOS (ABSOLUTE): 0.2 10*3/uL (ref 0.0–0.4)
Eos: 4 %
Hematocrit: 44.2 % (ref 34.0–46.6)
Hemoglobin: 14.1 g/dL (ref 11.1–15.9)
Immature Grans (Abs): 0 10*3/uL (ref 0.0–0.1)
Immature Granulocytes: 0 %
Lymphocytes Absolute: 1.3 10*3/uL (ref 0.7–3.1)
Lymphs: 24 %
MCH: 30.2 pg (ref 26.6–33.0)
MCHC: 31.9 g/dL (ref 31.5–35.7)
MCV: 95 fL (ref 79–97)
Monocytes Absolute: 0.3 10*3/uL (ref 0.1–0.9)
Monocytes: 5 %
Neutrophils Absolute: 3.5 10*3/uL (ref 1.4–7.0)
Neutrophils: 66 %
Platelets: 146 10*3/uL — ABNORMAL LOW (ref 150–450)
RBC: 4.67 x10E6/uL (ref 3.77–5.28)
RDW: 13.4 % (ref 11.7–15.4)
WBC: 5.4 10*3/uL (ref 3.4–10.8)

## 2019-07-02 LAB — IRON,TIBC AND FERRITIN PANEL
Ferritin: 43 ng/mL (ref 15–150)
Iron Saturation: 27 % (ref 15–55)
Iron: 90 ug/dL (ref 27–139)
Total Iron Binding Capacity: 330 ug/dL (ref 250–450)
UIBC: 240 ug/dL (ref 118–369)

## 2019-07-02 LAB — URIC ACID: Uric Acid: 4 mg/dL (ref 3.1–7.9)

## 2019-07-02 LAB — VITAMIN B12: Vitamin B-12: 333 pg/mL (ref 232–1245)

## 2019-07-02 LAB — LIPID PANEL W/O CHOL/HDL RATIO
Cholesterol, Total: 158 mg/dL (ref 100–199)
HDL: 87 mg/dL (ref >39)
LDL Chol Calc (NIH): 57 mg/dL (ref 0–99)
Triglycerides: 75 mg/dL (ref 0–149)
VLDL Cholesterol Cal: 14 mg/dL (ref 5–40)

## 2019-07-02 LAB — VITAMIN D 25 HYDROXY (VIT D DEFICIENCY, FRACTURES): Vit D, 25-Hydroxy: 25.6 ng/mL — ABNORMAL LOW (ref 30.0–100.0)

## 2019-07-02 NOTE — Progress Notes (Signed)
Good afternoon please let Amy Myers know following: - Overall labs show improvement in anemia, much better, no need to restart iron.   - Cholesterol levels are at goal and thyroid is in normal range, continue current medications. - Vitamin D is a little low.  I want her to start to take Vitamin D3 1000 units daily for bone health. - Vitamin B12 continues to be on lower side of normal, start taking Vitamin B12 1000 MCG daily which is good for nervous system health. IF any questions let me know.  Have a great day!!

## 2019-07-06 DIAGNOSIS — I10 Essential (primary) hypertension: Secondary | ICD-10-CM | POA: Diagnosis not present

## 2019-07-06 DIAGNOSIS — R0602 Shortness of breath: Secondary | ICD-10-CM | POA: Diagnosis not present

## 2019-07-06 DIAGNOSIS — I2510 Atherosclerotic heart disease of native coronary artery without angina pectoris: Secondary | ICD-10-CM | POA: Diagnosis not present

## 2019-07-06 DIAGNOSIS — I4891 Unspecified atrial fibrillation: Secondary | ICD-10-CM | POA: Diagnosis not present

## 2019-09-15 DIAGNOSIS — N1832 Chronic kidney disease, stage 3b: Secondary | ICD-10-CM | POA: Diagnosis not present

## 2019-09-15 DIAGNOSIS — R8290 Unspecified abnormal findings in urine: Secondary | ICD-10-CM | POA: Diagnosis not present

## 2019-09-24 DIAGNOSIS — N1831 Chronic kidney disease, stage 3a: Secondary | ICD-10-CM | POA: Diagnosis not present

## 2019-09-24 DIAGNOSIS — I10 Essential (primary) hypertension: Secondary | ICD-10-CM | POA: Diagnosis not present

## 2019-10-06 DIAGNOSIS — I4891 Unspecified atrial fibrillation: Secondary | ICD-10-CM | POA: Diagnosis not present

## 2019-10-06 DIAGNOSIS — R0602 Shortness of breath: Secondary | ICD-10-CM | POA: Diagnosis not present

## 2019-10-06 DIAGNOSIS — I2510 Atherosclerotic heart disease of native coronary artery without angina pectoris: Secondary | ICD-10-CM | POA: Diagnosis not present

## 2019-10-06 DIAGNOSIS — I10 Essential (primary) hypertension: Secondary | ICD-10-CM | POA: Diagnosis not present

## 2019-10-06 DIAGNOSIS — G4730 Sleep apnea, unspecified: Secondary | ICD-10-CM | POA: Diagnosis not present

## 2019-10-27 ENCOUNTER — Other Ambulatory Visit: Payer: Self-pay | Admitting: Nurse Practitioner

## 2019-10-27 DIAGNOSIS — Z1231 Encounter for screening mammogram for malignant neoplasm of breast: Secondary | ICD-10-CM

## 2019-12-02 ENCOUNTER — Telehealth: Payer: Self-pay | Admitting: Nurse Practitioner

## 2019-12-02 ENCOUNTER — Other Ambulatory Visit: Payer: Self-pay | Admitting: Nurse Practitioner

## 2019-12-02 DIAGNOSIS — L989 Disorder of the skin and subcutaneous tissue, unspecified: Secondary | ICD-10-CM

## 2019-12-02 NOTE — Telephone Encounter (Signed)
Called and notified patient.

## 2019-12-02 NOTE — Telephone Encounter (Signed)
Routing to provider for referral

## 2019-12-02 NOTE — Telephone Encounter (Signed)
Referral placed.

## 2019-12-02 NOTE — Telephone Encounter (Signed)
Copied from [place] [phone]. Topic: Referral - Request for Referral >> Dec 02, 2019  9:41 AM [name] wrote: Pt states she has a place on her face, and Amy Myers has seen it before.  But it has gotten bigger. It is raised and hard.  Pt would like appt with [name]

## 2019-12-04 ENCOUNTER — Other Ambulatory Visit: Payer: Self-pay

## 2019-12-04 ENCOUNTER — Ambulatory Visit
Admission: RE | Admit: 2019-12-04 | Discharge: 2019-12-04 | Disposition: A | Discharge status: Home / Self Care | Payer: Medicare Other | Source: Ambulatory Visit | Attending: Nurse Practitioner | Admitting: Nurse Practitioner

## 2019-12-04 DIAGNOSIS — Z1231 Encounter for screening mammogram for malignant neoplasm of breast: Secondary | ICD-10-CM | POA: Insufficient documentation

## 2019-12-22 ENCOUNTER — Other Ambulatory Visit: Payer: Self-pay | Admitting: Nurse Practitioner

## 2019-12-25 DIAGNOSIS — D485 Neoplasm of uncertain behavior of skin: Secondary | ICD-10-CM | POA: Diagnosis not present

## 2019-12-25 DIAGNOSIS — C44319 Basal cell carcinoma of skin of other parts of face: Secondary | ICD-10-CM | POA: Diagnosis not present

## 2019-12-28 ENCOUNTER — Ambulatory Visit (INDEPENDENT_AMBULATORY_CARE_PROVIDER_SITE_OTHER): Payer: Medicare Other

## 2019-12-28 VITALS — Ht 59.0 in | Wt 180.0 lb

## 2019-12-28 DIAGNOSIS — Z0000 Encounter for general adult medical examination without abnormal findings: Secondary | ICD-10-CM
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
## 2019-12-28 NOTE — Patient Instructions (Signed)
[name] , Thank you for taking time to come for your Medicare Wellness Visit. I appreciate your ongoing commitment to your health goals. Please review the following plan we discussed and let me know if I can assist you in the future.   Screening recommendations/referrals: Colonoscopy: completed 07/03/2017 Mammogram: completed 11/2019 per patient Bone Density: completed 01/18/2016 Recommended yearly ophthalmology/optometry visit for glaucoma screening and checkup Recommended yearly dental visit for hygiene and checkup  Vaccinations: Influenza vaccine: due Pneumococcal vaccine: completed 09/18/2013 Tdap vaccine: completed 06/21/2018 Shingles vaccine: discussed   Covid-19: 07/18/2019, 08/15/2019  Advanced directives: copy in chart  Conditions/risks identified: none  Next appointment: Follow up in one year for your annual wellness visit    Preventive Care 75 Years and Older, Female Preventive care refers to lifestyle choices and visits with your health care provider that can promote health and wellness. What does preventive care include?  A yearly physical exam. This is also called an annual well check.  Dental exams once or twice a year.  Routine eye exams. Ask your health care provider how often you should have your eyes checked.  Personal lifestyle choices, including:  Daily care of your teeth and gums.  Regular physical activity.  Eating a healthy diet.  Avoiding tobacco and drug use.  Limiting alcohol use.  Practicing safe sex.  Taking low-dose aspirin every day.  Taking vitamin and mineral supplements as recommended by your health care provider. What happens during an annual well check? The services and screenings done by your health care provider during your annual well check will depend on your age, overall health, lifestyle risk factors, and family history of disease. Counseling  Your health care provider may ask you questions about your:  Alcohol use.  Tobacco  use.  Drug use.  Emotional well-being.  Home and relationship well-being.  Sexual activity.  Eating habits.  History of falls.  Memory and ability to understand (cognition).  Work and work Statistician.  Reproductive health. Screening  You may have the following tests or measurements:  Height, weight, and BMI.  Blood pressure.  Lipid and cholesterol levels. These may be checked every 5 years, or more frequently if you are over 64 years old.  Skin check.  Lung cancer screening. You may have this screening every year starting at age 47 if you have a 30-pack-year history of smoking and currently smoke or have quit within the past 15 years.  Fecal occult blood test (FOBT) of the stool. You may have this test every year starting at age 37.  Flexible sigmoidoscopy or colonoscopy. You may have a sigmoidoscopy every 5 years or a colonoscopy every 10 years starting at age 23.  Hepatitis C blood test.  Hepatitis B blood test.  Sexually transmitted disease (STD) testing.  Diabetes screening. This is done by checking your blood sugar (glucose) after you have not eaten for a while (fasting). You may have this done every 1-3 years.  Bone density scan. This is done to screen for osteoporosis. You may have this done starting at age 64.  Mammogram. This may be done every 1-2 years. Talk to your health care provider about how often you should have regular mammograms. Talk with your health care provider about your test results, treatment options, and if necessary, the need for more tests. Vaccines  Your health care provider may recommend certain vaccines, such as:  Influenza vaccine. This is recommended every year.  Tetanus, diphtheria, and acellular pertussis (Tdap, Td) vaccine. You may need a  Td booster every 10 years.  Zoster vaccine. You may need this after age 41.  Pneumococcal 13-valent conjugate (PCV13) vaccine. One dose is recommended after age 78.  Pneumococcal  polysaccharide (PPSV23) vaccine. One dose is recommended after age 81. Talk to your health care provider about which screenings and vaccines you need and how often you need them. This information is not intended to replace advice given to you by your health care provider. Make sure you discuss any questions you have with your health care provider. Document Released: 04/29/2015 Document Revised: 12/21/2015 Document Reviewed: 02/01/2015 Elsevier Interactive Patient Education  2017 [place] Prevention in the Home Falls can cause injuries. They can happen to people of all ages. There are many things you can do to make your home safe and to help prevent falls. What can I do on the outside of my home?  Regularly fix the edges of walkways and driveways and fix any cracks.  Remove anything that might make you trip as you walk through a door, such as a raised step or threshold.  Trim any bushes or trees on the path to your home.  Use bright outdoor lighting.  Clear any walking paths of anything that might make someone trip, such as rocks or tools.  Regularly check to see if handrails are loose or broken. Make sure that both sides of any steps have handrails.  Any raised decks and porches should have guardrails on the edges.  Have any leaves, snow, or ice cleared regularly.  Use sand or salt on walking paths during winter.  Clean up any spills in your garage right away. This includes oil or grease spills. What can I do in the bathroom?  Use night lights.  Install grab bars by the toilet and in the tub and shower. Do not use towel bars as grab bars.  Use non-skid mats or decals in the tub or shower.  If you need to sit down in the shower, use a plastic, non-slip stool.  Keep the floor dry. Clean up any water that spills on the floor as soon as it happens.  Remove soap buildup in the tub or shower regularly.  Attach bath mats securely with double-sided non-slip rug  tape.  Do not have throw rugs and other things on the floor that can make you trip. What can I do in the bedroom?  Use night lights.  Make sure that you have a light by your bed that is easy to reach.  Do not use any sheets or blankets that are too big for your bed. They should not hang down onto the floor.  Have a firm chair that has side arms. You can use this for support while you get dressed.  Do not have throw rugs and other things on the floor that can make you trip. What can I do in the kitchen?  Clean up any spills right away.  Avoid walking on wet floors.  Keep items that you use a lot in easy-to-reach places.  If you need to reach something above you, use a strong step stool that has a grab bar.  Keep electrical cords out of the way.  Do not use floor polish or wax that makes floors slippery. If you must use wax, use non-skid floor wax.  Do not have throw rugs and other things on the floor that can make you trip. What can I do with my stairs?  Do not leave any items on the stairs.  Make sure that there are handrails on both sides of the stairs and use them. Fix handrails that are broken or loose. Make sure that handrails are as long as the stairways.  Check any carpeting to make sure that it is firmly attached to the stairs. Fix any carpet that is loose or worn.  Avoid having throw rugs at the top or bottom of the stairs. If you do have throw rugs, attach them to the floor with carpet tape.  Make sure that you have a light switch at the top of the stairs and the bottom of the stairs. If you do not have them, ask someone to add them for you. What else can I do to help prevent falls?  Wear shoes that:  Do not have high heels.  Have rubber bottoms.  Are comfortable and fit you well.  Are closed at the toe. Do not wear sandals.  If you use a stepladder:  Make sure that it is fully opened. Do not climb a closed stepladder.  Make sure that both sides of the  stepladder are locked into place.  Ask someone to hold it for you, if possible.  Clearly mark and make sure that you can see:  Any grab bars or handrails.  First and last steps.  Where the edge of each step is.  Use tools that help you move around (mobility aids) if they are needed. These include:  Canes.  Walkers.  Scooters.  Crutches.  Turn on the lights when you go into a dark area. Replace any light bulbs as soon as they burn out.  Set up your furniture so you have a clear path. Avoid moving your furniture around.  If any of your floors are uneven, fix them.  If there are any pets around you, be aware of where they are.  Review your medicines with your doctor. Some medicines can make you feel dizzy. This can increase your chance of falling. Ask your doctor what other things that you can do to help prevent falls. This information is not intended to replace advice given to you by your health care provider. Make sure you discuss any questions you have with your health care provider. Document Released: 01/27/2009 Document Revised: 09/08/2015 Document Reviewed: 05/07/2014 Elsevier Interactive Patient Education  2017 [employer].

## 2019-12-31 ENCOUNTER — Ambulatory Visit: Payer: Medicare Other

## 2020-01-01 ENCOUNTER — Ambulatory Visit: Payer: Medicare Other | Admitting: Nurse Practitioner

## 2020-01-04 DIAGNOSIS — I2510 Atherosclerotic heart disease of native coronary artery without angina pectoris: Secondary | ICD-10-CM | POA: Diagnosis not present

## 2020-01-04 DIAGNOSIS — I4891 Unspecified atrial fibrillation: Secondary | ICD-10-CM | POA: Diagnosis not present

## 2020-01-04 DIAGNOSIS — I10 Essential (primary) hypertension: Secondary | ICD-10-CM | POA: Diagnosis not present

## 2020-01-04 DIAGNOSIS — G4739 Other sleep apnea: Secondary | ICD-10-CM | POA: Diagnosis not present

## 2020-01-14 ENCOUNTER — Ambulatory Visit: Payer: Medicare Other | Admitting: Nurse Practitioner

## 2020-01-21 DIAGNOSIS — C44319 Basal cell carcinoma of skin of other parts of face: Secondary | ICD-10-CM | POA: Diagnosis not present

## 2020-01-28 ENCOUNTER — Encounter: Payer: Self-pay | Admitting: Nurse Practitioner

## 2020-01-28 DIAGNOSIS — I700 Atherosclerosis of aorta: Secondary | ICD-10-CM | POA: Insufficient documentation

## 2020-01-28 DIAGNOSIS — D6869 Other thrombophilia: Secondary | ICD-10-CM | POA: Insufficient documentation

## 2020-01-28 DIAGNOSIS — D696 Thrombocytopenia, unspecified: Secondary | ICD-10-CM | POA: Insufficient documentation

## 2020-02-01 ENCOUNTER — Other Ambulatory Visit: Payer: Self-pay

## 2020-02-01 ENCOUNTER — Encounter: Payer: Self-pay | Admitting: Nurse Practitioner

## 2020-02-01 ENCOUNTER — Ambulatory Visit (INDEPENDENT_AMBULATORY_CARE_PROVIDER_SITE_OTHER): Payer: Medicare Other | Admitting: Nurse Practitioner

## 2020-02-01 VITALS — BP 122/80 | HR 80 | Temp 97.7°F | Resp 16 | Ht 59.0 in | Wt 175.4 lb

## 2020-02-01 DIAGNOSIS — E079 Disorder of thyroid, unspecified: Secondary | ICD-10-CM | POA: Diagnosis not present

## 2020-02-01 DIAGNOSIS — E538 Deficiency of other specified B group vitamins: Secondary | ICD-10-CM | POA: Diagnosis not present

## 2020-02-01 DIAGNOSIS — I700 Atherosclerosis of aorta: Secondary | ICD-10-CM | POA: Diagnosis not present

## 2020-02-01 DIAGNOSIS — Z23 Encounter for immunization: Secondary | ICD-10-CM | POA: Diagnosis not present

## 2020-02-01 DIAGNOSIS — D6869 Other thrombophilia: Secondary | ICD-10-CM

## 2020-02-01 DIAGNOSIS — N1832 Chronic kidney disease, stage 3b: Secondary | ICD-10-CM | POA: Diagnosis not present

## 2020-02-01 DIAGNOSIS — M109 Gout, unspecified: Secondary | ICD-10-CM

## 2020-02-01 DIAGNOSIS — E7800 Pure hypercholesterolemia, unspecified: Secondary | ICD-10-CM | POA: Diagnosis not present

## 2020-02-01 DIAGNOSIS — I10 Essential (primary) hypertension: Secondary | ICD-10-CM

## 2020-02-01 DIAGNOSIS — I482 Chronic atrial fibrillation, unspecified: Secondary | ICD-10-CM | POA: Diagnosis not present

## 2020-02-01 DIAGNOSIS — D696 Thrombocytopenia, unspecified: Secondary | ICD-10-CM | POA: Diagnosis not present

## 2020-02-01 DIAGNOSIS — D519 Vitamin B12 deficiency anemia, unspecified: Secondary | ICD-10-CM | POA: Diagnosis not present

## 2020-02-01 DIAGNOSIS — Z6836 Body mass index (BMI) 36.0-36.9, adult: Secondary | ICD-10-CM

## 2020-02-01 NOTE — Assessment & Plan Note (Signed)
Chronic, stable with no decline noted on recent nephrology labs.  Continue collaboration with nephrology.  No current ACE or ARB, may benefit from this in future if no past side effects with these.  BMP today.

## 2020-02-01 NOTE — Assessment & Plan Note (Signed)
Ongoing.  Recheck B12 today and continue multivitamin + daily B12 orally.

## 2020-02-01 NOTE — Assessment & Plan Note (Signed)
Continues on Eliquis for atrial fibrillation.  Recent CBC with nephrology remains stable.  She is to notify provider if any increased bruising or skin breakdown. 

## 2020-02-01 NOTE — Assessment & Plan Note (Signed)
Chronic, rate controlled.  Continue current medication regimen and collaboration with cardiology (Dr. Khan).   ?

## 2020-02-01 NOTE — Assessment & Plan Note (Signed)
Noted on March 2021 labs at 146, but recent labs with nephrology showed improvement to 166.  Continue to monitor.  CBC next visit.

## 2020-02-01 NOTE — Assessment & Plan Note (Signed)
Chronic, ongoing.  Continue current medication regimen and adjust as needed based on labs. Lipid panel today. 

## 2020-02-01 NOTE — Patient Instructions (Signed)
Hypothyroidism  Hypothyroidism is when the thyroid gland does not make enough of certain hormones (it is underactive). The thyroid gland is a small gland located in the lower front part of the neck, just in front of the windpipe (trachea). This gland makes hormones that help control how the body uses food for energy (metabolism) as well as how the heart and brain function. These hormones also play a role in keeping your bones strong. When the thyroid is underactive, it produces too little of the hormones thyroxine (T4) and triiodothyronine (T3). What are the causes? This condition may be caused by:  Hashimoto's disease. This is a disease in which the body's disease-fighting system (immune system) attacks the thyroid gland. This is the most common cause.  Viral infections.  Pregnancy.  Certain medicines.  Birth defects.  Past radiation treatments to the head or neck for cancer.  Past treatment with radioactive iodine.  Past exposure to radiation in the environment.  Past surgical removal of part or all of the thyroid.  Problems with a gland in the center of the brain (pituitary gland).  Lack of enough iodine in the diet. What increases the risk? You are more likely to develop this condition if:  You are female.  You have a family history of thyroid conditions.  You use a medicine called lithium.  You take medicines that affect the immune system (immunosuppressants). What are the signs or symptoms? Symptoms of this condition include:  Feeling as though you have no energy (lethargy).  Not being able to tolerate cold.  Weight gain that is not explained by a change in diet or exercise habits.  Lack of appetite.  Dry skin.  Coarse hair.  Menstrual irregularity.  Slowing of thought processes.  Constipation.  Sadness or depression. How is this diagnosed? This condition may be diagnosed based on:  Your symptoms, your medical history, and a physical exam.  Blood  tests. You may also have imaging tests, such as an ultrasound or MRI. How is this treated? This condition is treated with medicine that replaces the thyroid hormones that your body does not make. After you begin treatment, it may take several weeks for symptoms to go away. Follow these instructions at home:  Take over-the-counter and prescription medicines only as told by your health care provider.  If you start taking any new medicines, tell your health care provider.  Keep all follow-up visits as told by your health care provider. This is important. ? As your condition improves, your dosage of thyroid hormone medicine may change. ? You will need to have blood tests regularly so that your health care provider can monitor your condition. Contact a health care provider if:  Your symptoms do not get better with treatment.  You are taking thyroid replacement medicine and you: ? Sweat a lot. ? Have tremors. ? Feel anxious. ? Lose weight rapidly. ? Cannot tolerate heat. ? Have emotional swings. ? Have diarrhea. ? Feel weak. Get help right away if you have:  Chest pain.  An irregular heartbeat.  A rapid heartbeat.  Difficulty breathing. Summary  Hypothyroidism is when the thyroid gland does not make enough of certain hormones (it is underactive).  When the thyroid is underactive, it produces too little of the hormones thyroxine (T4) and triiodothyronine (T3).  The most common cause is Hashimoto's disease, a disease in which the body's disease-fighting system (immune system) attacks the thyroid gland. The condition can also be caused by viral infections, medicine, pregnancy, or past   radiation treatment to the head or neck.  Symptoms may include weight gain, dry skin, constipation, feeling as though you do not have energy, and not being able to tolerate cold.  This condition is treated with medicine to replace the thyroid hormones that your body does not make. This information  is not intended to replace advice given to you by your health care provider. Make sure you discuss any questions you have with your health care provider. Document Revised: 03/15/2017 Document Reviewed: 03/13/2017 Elsevier Patient Education  2020 [employer]  

## 2020-02-01 NOTE — Assessment & Plan Note (Signed)
Chronic, ongoing.  Continue current medication regimen and adjust as needed.  Recheck thyroid panel today.  ATA guidelines recommend goal for age <6.

## 2020-02-01 NOTE — Assessment & Plan Note (Signed)
Noted on CT imaging 10/04/17.  Educated patient, continue statin and Eliquis daily.  Recommend focus on healthy diet and regular activity. ?

## 2020-02-01 NOTE — Assessment & Plan Note (Signed)
Recommended eating smaller high protein, low fat meals more frequently and exercising 30 mins a day 5 times a week with a goal of 10-15lb weight loss in the next 3 months. Patient voiced their understanding and motivation to adhere to these recommendations.  

## 2020-02-01 NOTE — Assessment & Plan Note (Signed)
Chronic, stable with no recent flares.  Continue Allopurinol and check uric acid level today.  Consider decreasing dose to 100 MG daily due to renal function, review with patient after labs return.

## 2020-02-01 NOTE — Assessment & Plan Note (Signed)
BMI 35.43 with a-fib and HTN + CKD.  Recommend continued focus on healthy diet choices and regular physical activity (30 minutes 5 days a week).

## 2020-02-01 NOTE — Assessment & Plan Note (Signed)
Chronic, ongoing with BP at goal in office and at goal on home readings majority of time.  Followed by cardiology and nephrology.  Continue current medication regimen and adjust as needed.  Continue collaboration with cardiologist and nephrologist.  Had recent kidney function labs in June last.  Check BMP today.  Recommend she continue to monitor BP at home a few days a week and document + focus on DASH diet.  Return in 6 months

## 2020-02-01 NOTE — Progress Notes (Signed)
BP 122/80 (BP Location: Left Arm, Patient Position: Sitting, Cuff Size: Normal)   Pulse 80   Temp 97.7 F (36.5 C) (Oral)   Resp 16   Ht 4\' 11"  (1.499 m)   Wt 175 lb 6.4 oz (79.6 kg)   LMP  (LMP Unknown)   SpO2 98%   BMI 35.43 kg/m    Subjective:    Patient ID: Amy Myers, female    DOB: 02/05/1946, 74 y.o.   MRN: [id number]  HPI: Amy Myers is a 74 y.o. female  Chief Complaint  Patient presents with  . Hypertension   HYPERTENSION / HYPERLIPIDEMIA & A-FIB Goes to cardiology and last saw on 10/06/19.  No changes made.  Continues on Metoprolol, Hydralazine, Amlodipine, and Simvastatin + Eliquis.  Last echo 05/07/2018 -- noted EF 67%, mild LVH with Grade 3 diastolic dysfunction.   Satisfied with current treatment? yes Duration of hypertension: chronic BP monitoring frequency: daily BP range: 130/80 range at home BP medication side effects: no Duration of hyperlipidemia: chronic Cholesterol medication side effects: no Cholesterol supplements: none Medication compliance: good compliance Aspirin: no Recent stressors: no Recurrent headaches: no Visual changes: no Palpitations: no Dyspnea: no Chest pain: no Lower extremity edema: no Dizzy/lightheaded: no   CHRONIC KIDNEY DISEASE/ANEMIA/GOUT Saw nephrology in 10/01/19 with CRT 1.17 and GFR 46, remaining baseline.  CBC showed H/H 13.6/43.3.  Is taking B12 daily.  Recent CBC with PCP noted mild low platelet at 146 and B12 333, but June labs with nephrology showed PLT 166. Denies any anemia symptoms.    Has underlying gout and takes Allopurinol daily, no recent flares. CKD status: stable Medications renally dose: yes Previous renal evaluation: yes Pneumovax:  Up to Date Influenza Vaccine:  Up to Date   ATRIAL FIBRILLATION Atrial fibrillation status: stable Satisfied with current treatment: yes  Medication side effects:  no Medication compliance: good compliance Etiology of atrial fibrillation:  unknown Palpitations:  no Chest pain:  no Dyspnea on exertion:  no Orthopnea:  no Syncope:  no Edema:  no Ventricular rate control: B-blocker Anti-coagulation: long acting  HYPOTHYROIDISM Last checked March 2021 -- TSH 3.510.  Continues on Levothyroxine 75 MCG. Thyroid control status:stable Satisfied with current treatment? yes Medication side effects: no Medication compliance: good compliance Etiology of hypothyroidism:  Recent dose adjustment:no Fatigue: no Cold intolerance: no Heat intolerance: no Weight gain: no Weight loss: no Constipation: sometimes Diarrhea/loose stools: no Palpitations: no Lower extremity edema: no Anxiety/depressed mood: no    Relevant past medical, surgical, family and social history reviewed and updated as indicated. Interim medical history since our last visit reviewed. Allergies and medications reviewed and updated.  Review of Systems  Constitutional: Negative for activity change, appetite change, diaphoresis, fatigue and fever.  Respiratory: Negative for cough, chest tightness and shortness of breath.   Cardiovascular: Negative for chest pain, palpitations and leg swelling.  Gastrointestinal: Negative for abdominal distention, abdominal pain, constipation, diarrhea, nausea and vomiting.  Endocrine: Negative for cold intolerance and heat intolerance.  Neurological: Negative for dizziness, syncope, weakness, light-headedness, numbness and headaches.  Psychiatric/Behavioral: Negative.     Per HPI unless specifically indicated above     Objective:    BP 122/80 (BP Location: Left Arm, Patient Position: Sitting, Cuff Size: Normal)   Pulse 80   Temp 97.7 F (36.5 C) (Oral)   Resp 16   Ht 4\' 11"  (1.499 m)   Wt 175 lb 6.4 oz (79.6 kg)   LMP  (LMP Unknown)   SpO2  98%   BMI 35.43 kg/m   Wt Readings from Last 3 Encounters:  02/01/20 175 lb 6.4 oz (79.6 kg)  12/28/19 180 lb (81.6 kg)  07/01/19 178 lb 9.6 oz (81 kg)    Physical  Exam Vitals and nursing note reviewed.  Constitutional:      General: She is awake. She is not in acute distress.    Appearance: She is well-developed and well-groomed. She is obese. She is not ill-appearing.  HENT:     Head: Normocephalic.     Right Ear: Hearing normal.     Left Ear: Hearing normal.  Eyes:     General: Lids are normal.        Right eye: No discharge.        Left eye: No discharge.     Conjunctiva/sclera: Conjunctivae normal.     Pupils: Pupils are equal, round, and reactive to light.  Neck:     Thyroid: No thyromegaly.     Vascular: No carotid bruit.  Cardiovascular:     Rate and Rhythm: Normal rate. Rhythm irregularly irregular.     Heart sounds: Normal heart sounds. No murmur heard.  No gallop.   Pulmonary:     Effort: Pulmonary effort is normal. No accessory muscle usage or respiratory distress.     Breath sounds: Normal breath sounds.  Abdominal:     General: Bowel sounds are normal.     Palpations: Abdomen is soft.  Musculoskeletal:     Cervical back: Normal range of motion and neck supple.     Right lower leg: No edema.     Left lower leg: No edema.  Skin:    General: Skin is warm and dry.  Neurological:     Mental Status: She is alert and oriented to person, place, and time.  Psychiatric:        Attention and Perception: Attention normal.        Mood and Affect: Mood normal.        Behavior: Behavior normal. Behavior is cooperative.        Thought Content: Thought content normal.        Judgment: Judgment normal.     Results for orders placed or performed in visit on 07/01/19  Lipid Panel w/o Chol/HDL Ratio  Result Value Ref Range   Cholesterol, Total 158 100 - 199 mg/dL   Triglycerides 75 0 - 149 mg/dL   HDL 87 >39 mg/dL   VLDL Cholesterol Cal 14 5 - 40 mg/dL   LDL Chol Calc (NIH) 57 0 - 99 mg/dL  Thyroid Panel With TSH  Result Value Ref Range   TSH 3.510 0.450 - 4.500 uIU/mL   T4, Total 10.5 4.5 - 12.0 ug/dL   T3 Uptake Ratio 28 24 -  39 %   Free Thyroxine Index 2.9 1.2 - 4.9  VITAMIN D 25 Hydroxy (Vit-D Deficiency, Fractures)  Result Value Ref Range   Vit D, 25-Hydroxy 25.6 (L) 30.0 - 100.0 ng/mL  Iron, TIBC and Ferritin Panel  Result Value Ref Range   Total Iron Binding Capacity 330 250 - 450 ug/dL   UIBC 240 118 - 369 ug/dL   Iron 90 27 - 139 ug/dL   Iron Saturation 27 15 - 55 %   Ferritin 43 15.0 - 150.0 ng/mL  Vitamin B12  Result Value Ref Range   Vitamin B-12 333 232 - 1,245 pg/mL  CBC with Differential/Platelet  Result Value Ref Range   WBC 5.4 3.4 - 10.8  x10E3/uL   RBC 4.67 3.77 - 5.28 x10E6/uL   Hemoglobin 14.1 11.1 - 15.9 g/dL   Hematocrit 44.2 34.0 - 46.6 %   MCV 95 79 - 97 fL   MCH 30.2 26.6 - 33.0 pg   MCHC 31.9 31 - 35 g/dL   RDW 13.4 11.7 - 15.4 %   Platelets 146 (L) 150 - 450 x10E3/uL   Neutrophils 66 Not Estab. %   Lymphs 24 Not Estab. %   Monocytes 5 Not Estab. %   Eos 4 Not Estab. %   Basos 1 Not Estab. %   Neutrophils Absolute 3.5 1 - 7 x10E3/uL   Lymphocytes Absolute 1.3 0 - 3 x10E3/uL   Monocytes Absolute 0.3 0 - 0 x10E3/uL   EOS (ABSOLUTE) 0.2 0.0 - 0.4 x10E3/uL   Basophils Absolute 0.1 0 - 0 x10E3/uL   Immature Granulocytes 0 Not Estab. %   Immature Grans (Abs) 0.0 0.0 - 0.1 x10E3/uL  Uric acid  Result Value Ref Range   Uric Acid 4.0 3.1 - 7.9 mg/dL      Assessment & Plan:   Problem List Items Addressed This Visit      Cardiovascular and Mediastinum   Chronic atrial fibrillation (HCC) - Primary    Chronic, rate controlled.  Continue current medication regimen and collaboration with cardiology (Dr. Humphrey Rolls).        Hypertension    Chronic, ongoing with BP at goal in office and at goal on home readings majority of time.  Followed by cardiology and nephrology.  Continue current medication regimen and adjust as needed.  Continue collaboration with cardiologist and nephrologist.  Had recent kidney function labs in June last.  Check BMP today.  Recommend she continue to monitor  BP at home a few days a week and document + focus on DASH diet.  Return in 6 months      Relevant Orders   Basic metabolic panel   Aortic atherosclerosis ([place])    Noted on CT imaging 10/04/17.  Educated patient, continue statin and Eliquis daily.  Recommend focus on healthy diet and regular activity.        Endocrine   Thyroid disease    Chronic, ongoing.  Continue current medication regimen and adjust as needed.  Recheck thyroid panel today.  ATA guidelines recommend goal for age <6.        Relevant Orders   TSH   T4, free     Genitourinary   Stage 3 chronic kidney disease (HCC)    Chronic, stable with no decline noted on recent nephrology labs.  Continue collaboration with nephrology.  No current ACE or ARB, may benefit from this in future if no past side effects with these.  BMP today.      Relevant Orders   Basic metabolic panel     Hematopoietic and Hemostatic   Other thrombophilia ([place])    Continues on Eliquis for atrial fibrillation.  Recent CBC with nephrology remains stable.  She is to notify provider if any increased bruising or skin breakdown.        Other   Morbid obesity ([place])    BMI 35.43 with a-fib and HTN + CKD.  Recommend continued focus on healthy diet choices and regular physical activity (30 minutes 5 days a week).       Hyperlipidemia    Chronic, ongoing.  Continue current medication regimen and adjust as needed based on labs.  Lipid panel today.      Relevant Orders  Lipid Panel w/o Chol/HDL Ratio   Gout    Chronic, stable with no recent flares.  Continue Allopurinol and check uric acid level today.  Consider decreasing dose to 100 MG daily due to renal function, review with patient after labs return.      Vitamin B12 deficiency    Ongoing.  Recheck B12 today and continue multivitamin + daily B12 orally.      Relevant Orders   Vitamin B12   BMI 36.0-36.9,adult    Recommended eating smaller high protein, low fat meals more frequently and  exercising 30 mins a day 5 times a week with a goal of 10-15lb weight loss in the next 3 months. Patient voiced their understanding and motivation to adhere to these recommendations.       Thrombocytopenia ([place])    Noted on March 2021 labs at 146, but recent labs with nephrology showed improvement to 166.  Continue to monitor.  CBC next visit.       Other Visit Diagnoses    Need for influenza vaccination       Relevant Orders   Flu Vaccine QUAD High Dose(Fluad) (Completed)       Follow up plan: Return in about 6 months (around 08/01/2020) for Thyroid, HTN/HLD, A-Fib, Gout, B12 def.

## 2020-02-02 LAB — BASIC METABOLIC PANEL
BUN/Creatinine Ratio: 9 — ABNORMAL LOW (ref 12–28)
BUN: 11 mg/dL (ref 8–27)
CO2: 21 mmol/L (ref 20–29)
Calcium: 9.7 mg/dL (ref 8.7–10.3)
Chloride: 103 mmol/L (ref 96–106)
Creatinine, Ser: 1.26 mg/dL — ABNORMAL HIGH (ref 0.57–1.00)
GFR calc Af Amer: 48 mL/min/{1.73_m2} — ABNORMAL LOW (ref >59)
GFR calc non Af Amer: 42 mL/min/{1.73_m2} — ABNORMAL LOW (ref >59)
Glucose: 92 mg/dL (ref 65–99)
Potassium: 4.1 mmol/L (ref 3.5–5.2)
Sodium: 142 mmol/L (ref 134–144)

## 2020-02-02 LAB — LIPID PANEL W/O CHOL/HDL RATIO
Cholesterol, Total: 140 mg/dL (ref 100–199)
HDL: 78 mg/dL (ref >39)
LDL Chol Calc (NIH): 47 mg/dL (ref 0–99)
Triglycerides: 81 mg/dL (ref 0–149)
VLDL Cholesterol Cal: 15 mg/dL (ref 5–40)

## 2020-02-02 LAB — T4, FREE: Free T4: 1.74 ng/dL (ref 0.82–1.77)

## 2020-02-02 LAB — VITAMIN B12: Vitamin B-12: 777 pg/mL (ref 232–1245)

## 2020-02-02 LAB — TSH: TSH: 1.28 u[IU]/mL (ref 0.450–4.500)

## 2020-02-02 NOTE — Progress Notes (Signed)
Please let Amy Myers know her labs have returned and overall kidney function is staying at baseline, cholesterol levels are at goal, thyroid and B12 levels normal.  No medication changes needed at this time. Can continue current regimen. Keep being awesome!!  Thank you for allowing me to participate in your care. Kindest regards, Texanna Hilburn

## 2020-03-09 ENCOUNTER — Ambulatory Visit: Payer: Medicare Other | Admitting: Dermatology

## 2020-03-14 ENCOUNTER — Other Ambulatory Visit: Payer: Self-pay | Admitting: Nurse Practitioner

## 2020-03-21 DIAGNOSIS — N1831 Chronic kidney disease, stage 3a: Secondary | ICD-10-CM | POA: Diagnosis not present

## 2020-03-28 DIAGNOSIS — I10 Essential (primary) hypertension: Secondary | ICD-10-CM | POA: Diagnosis not present

## 2020-03-28 DIAGNOSIS — N1832 Chronic kidney disease, stage 3b: Secondary | ICD-10-CM | POA: Diagnosis not present

## 2020-05-05 DIAGNOSIS — I2510 Atherosclerotic heart disease of native coronary artery without angina pectoris: Secondary | ICD-10-CM | POA: Diagnosis not present

## 2020-05-05 DIAGNOSIS — G4730 Sleep apnea, unspecified: Secondary | ICD-10-CM | POA: Diagnosis not present

## 2020-05-05 DIAGNOSIS — R0602 Shortness of breath: Secondary | ICD-10-CM | POA: Diagnosis not present

## 2020-05-05 DIAGNOSIS — I4891 Unspecified atrial fibrillation: Secondary | ICD-10-CM | POA: Diagnosis not present

## 2020-06-08 ENCOUNTER — Other Ambulatory Visit: Payer: Self-pay | Admitting: Nurse Practitioner

## 2020-07-26 ENCOUNTER — Other Ambulatory Visit: Payer: Self-pay | Admitting: Nurse Practitioner

## 2020-07-26 NOTE — Telephone Encounter (Signed)
Requested Prescriptions  Pending Prescriptions Disp Refills  . allopurinol (ZYLOPRIM) 100 MG tablet [Pharmacy Med Name: ALLOPURINOL 100 MG TABLET] 180 tablet 0    Sig: Take 1 tablet (100 mg total) by mouth 2 (two) times daily.     Endocrinology:  Gout Agents Failed - 07/26/2020 10:43 AM      Failed - Uric Acid in normal range and within 360 days    Uric Acid  Date Value Ref Range Status  07/01/2019 4.0 3.1 - 7.9 mg/dL Final    Comment:               Therapeutic target for gout patients: <6.0         Failed - Cr in normal range and within 360 days    Creatinine, Ser  Date Value Ref Range Status  02/01/2020 1.26 (H) 0.57 - 1.00 mg/dL Final         Passed - Valid encounter within last 12 months    Recent Outpatient Visits          5 months ago Chronic atrial fibrillation ([place])   [place], Henrine Screws T, NP   1 year ago Chronic atrial fibrillation ([place])   [place], Buell T, NP   1 year ago Benign hypertension with chronic kidney disease, stage III ([place])   [place], Jolene T, NP   1 year ago Benign hypertension with chronic kidney disease, stage III ([place])   [place] Cannady, Jolene T, NP   2 years ago Cellulitis of left knee   [place], Barbaraann Faster, NP      Future Appointments            In 1 week Cannady, Barbaraann Faster, NP [facility]   In 5 months  [facility]

## 2020-07-29 ENCOUNTER — Encounter: Payer: Self-pay | Admitting: Nurse Practitioner

## 2020-08-02 DIAGNOSIS — Z85828 Personal history of other malignant neoplasm of skin: Secondary | ICD-10-CM | POA: Diagnosis not present

## 2020-08-02 DIAGNOSIS — L918 Other hypertrophic disorders of the skin: Secondary | ICD-10-CM | POA: Diagnosis not present

## 2020-08-02 DIAGNOSIS — L578 Other skin changes due to chronic exposure to nonionizing radiation: Secondary | ICD-10-CM | POA: Diagnosis not present

## 2020-08-05 ENCOUNTER — Encounter: Payer: Self-pay | Admitting: Nurse Practitioner

## 2020-08-05 ENCOUNTER — Ambulatory Visit (INDEPENDENT_AMBULATORY_CARE_PROVIDER_SITE_OTHER): Payer: Medicare Other | Admitting: Nurse Practitioner

## 2020-08-05 ENCOUNTER — Other Ambulatory Visit: Payer: Self-pay

## 2020-08-05 VITALS — BP 134/72 | HR 82

## 2020-08-05 DIAGNOSIS — N1832 Chronic kidney disease, stage 3b: Secondary | ICD-10-CM

## 2020-08-05 DIAGNOSIS — E7800 Pure hypercholesterolemia, unspecified: Secondary | ICD-10-CM

## 2020-08-05 DIAGNOSIS — I10 Essential (primary) hypertension: Secondary | ICD-10-CM | POA: Diagnosis not present

## 2020-08-05 DIAGNOSIS — D696 Thrombocytopenia, unspecified: Secondary | ICD-10-CM | POA: Diagnosis not present

## 2020-08-05 DIAGNOSIS — E079 Disorder of thyroid, unspecified: Secondary | ICD-10-CM

## 2020-08-05 DIAGNOSIS — I482 Chronic atrial fibrillation, unspecified: Secondary | ICD-10-CM | POA: Diagnosis not present

## 2020-08-05 DIAGNOSIS — I700 Atherosclerosis of aorta: Secondary | ICD-10-CM | POA: Diagnosis not present

## 2020-08-05 DIAGNOSIS — E559 Vitamin D deficiency, unspecified: Secondary | ICD-10-CM | POA: Diagnosis not present

## 2020-08-05 DIAGNOSIS — M109 Gout, unspecified: Secondary | ICD-10-CM

## 2020-08-05 DIAGNOSIS — E538 Deficiency of other specified B group vitamins: Secondary | ICD-10-CM

## 2020-08-05 DIAGNOSIS — D6869 Other thrombophilia: Secondary | ICD-10-CM | POA: Diagnosis not present

## 2020-08-05 DIAGNOSIS — Z7901 Long term (current) use of anticoagulants: Secondary | ICD-10-CM

## 2020-08-05 DIAGNOSIS — M85852 Other specified disorders of bone density and structure, left thigh: Secondary | ICD-10-CM | POA: Diagnosis not present

## 2020-08-05 MED ORDER — LEVOTHYROXINE SODIUM 75 MCG PO TABS: 75.0000 ug | ORAL_TABLET | Freq: Every day | ORAL | 4 refills | Status: DC

## 2020-08-05 MED ORDER — ALLOPURINOL 100 MG PO TABS
100.0000 mg | ORAL_TABLET | Freq: Two times a day (BID) | ORAL | 4 refills | Status: DC
Start: 2020-08-05 — End: 2021-08-04

## 2020-08-05 MED ORDER — RALOXIFENE HCL 60 MG PO TABS: 60.0000 mg | ORAL_TABLET | Freq: Every day | ORAL | 4 refills | Status: DC

## 2020-08-05 NOTE — Progress Notes (Signed)
BP 134/72   Pulse 82   LMP  (LMP Unknown)    Subjective:    Patient ID: Amy Myers, female    DOB: [birth year], 75 y.o.   MRN: [id number]  HPI: Amy Myers is a 75 y.o. female  Chief Complaint  Patient presents with  . Hyperlipidemia  . Hypertension  . Atrial Fibrillation  . Gout  . Medication Refill  . Vitamin B12  . Thyroid Problem  . Follow-up    Patient states she is doing well and states she is requesting refills on medications.     . This visit was completed via telephone due to the restrictions of the COVID-19 pandemic. All issues as above were discussed and addressed but no physical exam was performed. If it was felt that the patient should be evaluated in the office, they were directed there. The patient verbally consented to this visit. Patient was unable to complete an audio/visual visit due to Lack of equipment. Due to the catastrophic nature of the COVID-19 pandemic, this visit was done through audio contact only. . Location of the patient: home . Location of the provider: home . Those involved with this call:  . Provider: Marnee Guarneri, DNP . CMA: Yvonna Alanis, CMA . Front Desk/Registration: Jill Side  . Time spent on call: 21 minutes on the phone discussing health concerns. 15 minutes total spent in review of patient's record and preparation of their chart.  . I verified patient identity using two factors (patient name and date of birth). Patient consents verbally to being seen via telemedicine visit today.    HYPERTENSION / HYPERLIPIDEMIA & A-FIB Goes to cardiology, Dr. Humphrey Rolls, and last saw first of year and sees again next month.  No changes made.  Continues on Metoprolol, Hydralazine, Amlodipine, and Simvastatin + Eliquis.  Last echo 05/07/2018 -- noted EF 67%, mild LVH with Grade 3 diastolic dysfunction.  History of aortic atherosclerosis noted on past imaging. Satisfied with current treatment? yes Duration of hypertension: chronic BP  monitoring frequency: daily BP range: 130/80 range at home BP medication side effects: no Duration of hyperlipidemia: chronic Cholesterol medication side effects: no Cholesterol supplements: none Medication compliance: good compliance Aspirin: no Recent stressors: no Recurrent headaches: no Visual changes: no Palpitations: no Dyspnea: no Chest pain: no Lower extremity edema: no Dizzy/lightheaded: no   CHRONIC KIDNEY DISEASE/ANEMIA/GOUT Saw nephrology in 03/28/20 with CRT 1.28 and GFR 41, remaining baseline. CBC showed H/H 13.6/43.3.  Is taking B12 daily.  Recent CBC with PCP noted mild low platelet at 146 and B12 777, but June labs with nephrology showed PLT 166. Denies any anemia symptoms.    Has underlying gout and takes Allopurinol daily, no recent flares.  Last uric acid level 4.0 in March 2021. CKD status: stable Medications renally dose: yes Previous renal evaluation: yes Pneumovax:  Up to Date Influenza Vaccine:  Up to Date   ATRIAL FIBRILLATION Atrial fibrillation status: stable Satisfied with current treatment: yes  Medication side effects:  no Medication compliance: good compliance Etiology of atrial fibrillation: unknown Palpitations:  no Chest pain:  no Dyspnea on exertion:  no Orthopnea:  no Syncope:  no Edema:  no Ventricular rate control: B-blocker Anti-coagulation: long acting  HYPOTHYROIDISM Last checked October 2021 = 1.280.  Continues on Levothyroxine 75 MCG. Thyroid control status:stable Satisfied with current treatment? yes Medication side effects: no Medication compliance: good compliance Etiology of hypothyroidism:  Recent dose adjustment:no Fatigue: no Cold intolerance: no Heat intolerance: no Weight gain: no  Weight loss: no Constipation: sometimes Diarrhea/loose stools: no Palpitations: no Lower extremity edema: no Anxiety/depressed mood: no   OSTEOPENIA Noted on imaging 01/18/2016. Satisfied with current treatment?:  yes Adequate calcium & vitamin D: yes Intolerance to bisphosphonates:Evista currently Weight bearing exercises: yes  Relevant past medical, surgical, family and social history reviewed and updated as indicated. Interim medical history since our last visit reviewed. Allergies and medications reviewed and updated.  Review of Systems  Constitutional: Negative for activity change, appetite change, diaphoresis, fatigue and fever.  Respiratory: Negative for cough, chest tightness and shortness of breath.   Cardiovascular: Negative for chest pain, palpitations and leg swelling.  Gastrointestinal: Negative.   Endocrine: Negative for cold intolerance and heat intolerance.  Neurological: Negative.   Psychiatric/Behavioral: Negative.     Per HPI unless specifically indicated above     Objective:    BP 134/72   Pulse 82   LMP  (LMP Unknown)   Wt Readings from Last 3 Encounters:  02/01/20 175 lb 6.4 oz (79.6 kg)  12/28/19 180 lb (81.6 kg)  07/01/19 178 lb 9.6 oz (81 kg)    Physical Exam   Unable to perform due to telephone visit only.  Results for orders placed or performed in visit on 79/02/40  Basic metabolic panel  Result Value Ref Range   Glucose 92 65 - 99 mg/dL   BUN 11 8 - 27 mg/dL   Creatinine, Ser 1.26 (H) 0.57 - 1.00 mg/dL   GFR calc non Af Amer 42 (L) >59 mL/min/1.73   GFR calc Af Amer 48 (L) >59 mL/min/1.73   BUN/Creatinine Ratio 9 (L) 12 - 28   Sodium 142 134 - 144 mmol/L   Potassium 4.1 3.5 - 5.2 mmol/L   Chloride 103 96 - 106 mmol/L   CO2 21 20 - 29 mmol/L   Calcium 9.7 8.7 - 10.3 mg/dL  Lipid Panel w/o Chol/HDL Ratio  Result Value Ref Range   Cholesterol, Total 140 100 - 199 mg/dL   Triglycerides 81 0 - 149 mg/dL   HDL 78 >39 mg/dL   VLDL Cholesterol Cal 15 5 - 40 mg/dL   LDL Chol Calc (NIH) 47 0 - 99 mg/dL  TSH  Result Value Ref Range   TSH 1.280 0.450 - 4.500 uIU/mL  Vitamin B12  Result Value Ref Range   Vitamin B-12 777 232 - 1,245 pg/mL  T4, free   Result Value Ref Range   Free T4 1.74 0.82 - 1.77 ng/dL      Assessment & Plan:   Problem List Items Addressed This Visit      Cardiovascular and Mediastinum   Chronic atrial fibrillation (HCC) - Primary    Chronic, rate controlled.  Continue current medication regimen and collaboration with cardiology (Dr. Humphrey Rolls).        Hypertension    Chronic, ongoing with BP at goal on home readings.  Followed by cardiology and nephrology.  Continue current medication regimen and adjust as needed.  Continue collaboration with cardiologist and nephrologist.  Check CMP, CBC outpatient and urine ALB.  Recommend she continue to monitor BP at home a few days a week and document + focus on DASH diet.  Return in 6 months      Aortic atherosclerosis ([place])    Noted on CT imaging 10/04/17.  Educated patient, continue statin and Eliquis daily.  Recommend focus on healthy diet and regular activity.        Endocrine   Thyroid disease    Chronic,  ongoing.  Continue current medication regimen and adjust as needed.  Recheck thyroid panel in October next.  ATA guidelines recommend goal for age <6.        Relevant Medications   levothyroxine (SYNTHROID) 75 MCG tablet     Musculoskeletal and Integument   Osteopenia of neck of left femur    Noted on 2017 DEXA, need for repeat in October 2022.  Continue supplements at home and gentle weight bearing exercises.  Check Vit D outpatient. Continue Evista and may take holiday from this if stable next scan.        Genitourinary   Stage 3 chronic kidney disease (HCC)    Chronic, stable with no decline noted on recent nephrology labs.  Continue collaboration with nephrology.  No current ACE or ARB, may benefit from this in future if no past side effects with these.  CMP and urine ALB outpatient + CBC.      Relevant Orders   Comprehensive metabolic panel   Microalbumin, Urine Waived     Hematopoietic and Hemostatic   Other thrombophilia ([place])    Continues on  Eliquis for atrial fibrillation.  Recent CBC with nephrology remains stable.  She is to notify provider if any increased bruising or skin breakdown.        Other   Morbid obesity ([place])    BMI 35.43 last visit with a-fib and HTN + CKD.  Recommend continued focus on healthy diet choices and regular physical activity (30 minutes 5 days a week).       Hyperlipidemia    Chronic, ongoing.  Continue current medication regimen and adjust as needed based on labs.  Lipid panel outpatient.      Relevant Orders   Lipid Panel w/o Chol/HDL Ratio   Gout    Chronic, stable with no recent flares.  Continue Allopurinol and check uric acid level outpatient.  Consider decreasing dose to 100 MG daily due to renal function, review with patient after labs return.      Relevant Orders   Uric acid   Vitamin B12 deficiency    Ongoing, improved recent labs.  Recheck B12 outpatient and continue multivitamin + daily B12 orally.      Relevant Orders   CBC with Differential/Platelet   Vitamin B12   Long term (current) use of anticoagulants    Chronic, ongoing with a-fib.  Continue current medication regimen and check CBC outpatient.  Continue collaboration with cardiology.      Vitamin D deficiency    Noted on labs in past, continue daily supplement as has underlying osteopenia and recheck Vit D level outpatient.  Repeat DEXA in October 2022.      Relevant Orders   VITAMIN D 25 Hydroxy (Vit-D Deficiency, Fractures)   Thrombocytopenia ([place])    Noted on on past labs intermittently.  Continue to monitor.  CBC outpatient.         I discussed the assessment and treatment plan with the patient. The patient was provided an opportunity to ask questions and all were answered. The patient agreed with the plan and demonstrated an understanding of the instructions.   The patient was advised to call back or seek an in-person evaluation if the symptoms worsen or if the condition fails to improve as  anticipated.   I provided 21+ minutes of time during this encounter.  Follow up plan: Return in about 6 months (around 02/04/2021) for HTN/HLD, A-FIB, THYROID, HEALTH MAINTENANCE CHECK.

## 2020-08-05 NOTE — Assessment & Plan Note (Signed)
Chronic, ongoing with BP at goal on home readings.  Followed by cardiology and nephrology.  Continue current medication regimen and adjust as needed.  Continue collaboration with cardiologist and nephrologist.  Check CMP, CBC outpatient and urine ALB.  Recommend she continue to monitor BP at home a few days a week and document + focus on DASH diet.  Return in 6 months

## 2020-08-05 NOTE — Assessment & Plan Note (Signed)
Ongoing, improved recent labs.  Recheck B12 outpatient and continue multivitamin + daily B12 orally.

## 2020-08-05 NOTE — Assessment & Plan Note (Signed)
Chronic, rate controlled.  Continue current medication regimen and collaboration with cardiology (Dr. Khan).   ?

## 2020-08-05 NOTE — Assessment & Plan Note (Signed)
Chronic, stable with no recent flares.  Continue Allopurinol and check uric acid level outpatient.  Consider decreasing dose to 100 MG daily due to renal function, review with patient after labs return.

## 2020-08-05 NOTE — Assessment & Plan Note (Signed)
Noted on CT imaging 10/04/17.  Educated patient, continue statin and Eliquis daily.  Recommend focus on healthy diet and regular activity. ?

## 2020-08-05 NOTE — Assessment & Plan Note (Signed)
Noted on labs in past, continue daily supplement as has underlying osteopenia and recheck Vit D level outpatient.  Repeat DEXA in October 2022.

## 2020-08-05 NOTE — Assessment & Plan Note (Addendum)
Chronic, ongoing with a-fib.  Continue current medication regimen and check CBC outpatient.  Continue collaboration with cardiology.

## 2020-08-05 NOTE — Assessment & Plan Note (Signed)
Continues on Eliquis for atrial fibrillation.  Recent CBC with nephrology remains stable.  She is to notify provider if any increased bruising or skin breakdown. 

## 2020-08-05 NOTE — Assessment & Plan Note (Addendum)
Noted on 2017 DEXA, need for repeat in October 2022.  Continue supplements at home and gentle weight bearing exercises.  Check Vit D outpatient. Continue Evista and may take holiday from this if stable next scan.

## 2020-08-05 NOTE — Assessment & Plan Note (Signed)
Noted on on past labs intermittently.  Continue to monitor.  CBC outpatient.

## 2020-08-05 NOTE — Patient Instructions (Signed)

## 2020-08-05 NOTE — Assessment & Plan Note (Addendum)
BMI 35.43 last visit with a-fib and HTN + CKD.  Recommend continued focus on healthy diet choices and regular physical activity (30 minutes 5 days a week).

## 2020-08-05 NOTE — Assessment & Plan Note (Signed)
Chronic, ongoing.  Continue current medication regimen and adjust as needed.  Recheck thyroid panel in October next.  ATA guidelines recommend goal for age <6.

## 2020-08-05 NOTE — Assessment & Plan Note (Signed)
Chronic, ongoing.  Continue current medication regimen and adjust as needed based on labs.  Lipid panel outpatient.

## 2020-08-05 NOTE — Assessment & Plan Note (Signed)
Chronic, stable with no decline noted on recent nephrology labs.  Continue collaboration with nephrology.  No current ACE or ARB, may benefit from this in future if no past side effects with these.  CMP and urine ALB outpatient + CBC.

## 2020-08-10 DIAGNOSIS — H35363 Drusen (degenerative) of macula, bilateral: Secondary | ICD-10-CM | POA: Diagnosis not present

## 2020-08-10 DIAGNOSIS — H2513 Age-related nuclear cataract, bilateral: Secondary | ICD-10-CM | POA: Diagnosis not present

## 2020-08-10 DIAGNOSIS — H25013 Cortical age-related cataract, bilateral: Secondary | ICD-10-CM | POA: Diagnosis not present

## 2020-08-18 ENCOUNTER — Other Ambulatory Visit: Payer: Medicare Other

## 2020-08-18 ENCOUNTER — Other Ambulatory Visit: Payer: Self-pay

## 2020-08-18 DIAGNOSIS — N1832 Chronic kidney disease, stage 3b: Secondary | ICD-10-CM

## 2020-08-18 DIAGNOSIS — M109 Gout, unspecified: Secondary | ICD-10-CM | POA: Diagnosis not present

## 2020-08-18 DIAGNOSIS — E559 Vitamin D deficiency, unspecified: Secondary | ICD-10-CM | POA: Diagnosis not present

## 2020-08-18 DIAGNOSIS — E7800 Pure hypercholesterolemia, unspecified: Secondary | ICD-10-CM | POA: Diagnosis not present

## 2020-08-18 DIAGNOSIS — E538 Deficiency of other specified B group vitamins: Secondary | ICD-10-CM

## 2020-08-18 LAB — MICROALBUMIN, URINE WAIVED
Creatinine, Urine Waived: 50 mg/dL (ref 10–300)
Microalb, Ur Waived: 10 mg/L (ref 0–19)

## 2020-08-19 LAB — LIPID PANEL W/O CHOL/HDL RATIO
Cholesterol, Total: 158 mg/dL (ref 100–199)
HDL: 81 mg/dL (ref >39)
LDL Chol Calc (NIH): 62 mg/dL (ref 0–99)
Triglycerides: 78 mg/dL (ref 0–149)
VLDL Cholesterol Cal: 15 mg/dL (ref 5–40)

## 2020-08-19 LAB — COMPREHENSIVE METABOLIC PANEL
ALT: 10 IU/L (ref 0–32)
AST: 14 IU/L (ref 0–40)
Albumin/Globulin Ratio: 2.4 — ABNORMAL HIGH (ref 1.2–2.2)
Albumin: 4.5 g/dL (ref 3.7–4.7)
Alkaline Phosphatase: 73 IU/L (ref 44–121)
BUN/Creatinine Ratio: 11 — ABNORMAL LOW (ref 12–28)
BUN: 14 mg/dL (ref 8–27)
Bilirubin Total: 0.5 mg/dL (ref 0.0–1.2)
CO2: 24 mmol/L (ref 20–29)
Calcium: 9.7 mg/dL (ref 8.7–10.3)
Chloride: 99 mmol/L (ref 96–106)
Creatinine, Ser: 1.3 mg/dL — ABNORMAL HIGH (ref 0.57–1.00)
Globulin, Total: 1.9 g/dL (ref 1.5–4.5)
Glucose: 91 mg/dL (ref 65–99)
Potassium: 4.1 mmol/L (ref 3.5–5.2)
Sodium: 139 mmol/L (ref 134–144)
Total Protein: 6.4 g/dL (ref 6.0–8.5)
eGFR: 43 mL/min/{1.73_m2} — ABNORMAL LOW (ref >59)

## 2020-08-19 LAB — CBC WITH DIFFERENTIAL/PLATELET
Basophils Absolute: 0.1 10*3/uL (ref 0.0–0.2)
Basos: 1 %
EOS (ABSOLUTE): 0.2 10*3/uL (ref 0.0–0.4)
Eos: 3 %
Hematocrit: 40.5 % (ref 34.0–46.6)
Hemoglobin: 13.2 g/dL (ref 11.1–15.9)
Immature Grans (Abs): 0 10*3/uL (ref 0.0–0.1)
Immature Granulocytes: 0 %
Lymphocytes Absolute: 1.5 10*3/uL (ref 0.7–3.1)
Lymphs: 27 %
MCH: 29.9 pg (ref 26.6–33.0)
MCHC: 32.6 g/dL (ref 31.5–35.7)
MCV: 92 fL (ref 79–97)
Monocytes Absolute: 0.3 10*3/uL (ref 0.1–0.9)
Monocytes: 6 %
Neutrophils Absolute: 3.6 10*3/uL (ref 1.4–7.0)
Neutrophils: 63 %
Platelets: 151 10*3/uL (ref 150–450)
RBC: 4.42 x10E6/uL (ref 3.77–5.28)
RDW: 13.6 % (ref 11.7–15.4)
WBC: 5.7 10*3/uL (ref 3.4–10.8)

## 2020-08-19 LAB — VITAMIN D 25 HYDROXY (VIT D DEFICIENCY, FRACTURES): Vit D, 25-Hydroxy: 21.2 ng/mL — ABNORMAL LOW (ref 30.0–100.0)

## 2020-08-19 LAB — VITAMIN B12: Vitamin B-12: 460 pg/mL (ref 232–1245)

## 2020-08-19 LAB — URIC ACID: Uric Acid: 4 mg/dL (ref 3.1–7.9)

## 2020-08-19 NOTE — Progress Notes (Signed)
Good morning, please let Amy Myers know her labs have returned.  Overall everything remains stable, including kidney function which remains similar to past labs and shows no significant decline.  Vitamin D level remains slightly low, I do want her to ensure she is taking Vitamin D3 2000 units daily and we will recheck next visit, if still lower side we may consider higher weekly dosing.  If any questions let me know.  Have a great day!! Keep being awesome!!  Thank you for allowing me to participate in your care.  I appreciate you. Kindest regards, Kainon Varady

## 2020-09-02 DIAGNOSIS — R0602 Shortness of breath: Secondary | ICD-10-CM | POA: Diagnosis not present

## 2020-09-02 DIAGNOSIS — R079 Chest pain, unspecified: Secondary | ICD-10-CM | POA: Diagnosis not present

## 2020-09-02 DIAGNOSIS — G4730 Sleep apnea, unspecified: Secondary | ICD-10-CM | POA: Diagnosis not present

## 2020-09-02 DIAGNOSIS — I2510 Atherosclerotic heart disease of native coronary artery without angina pectoris: Secondary | ICD-10-CM | POA: Diagnosis not present

## 2020-09-02 DIAGNOSIS — I4891 Unspecified atrial fibrillation: Secondary | ICD-10-CM | POA: Diagnosis not present

## 2020-09-16 DIAGNOSIS — R079 Chest pain, unspecified: Secondary | ICD-10-CM | POA: Diagnosis not present

## 2020-09-19 DIAGNOSIS — N1832 Chronic kidney disease, stage 3b: Secondary | ICD-10-CM | POA: Diagnosis not present

## 2020-09-19 DIAGNOSIS — R8290 Unspecified abnormal findings in urine: Secondary | ICD-10-CM | POA: Diagnosis not present

## 2020-09-20 DIAGNOSIS — I4891 Unspecified atrial fibrillation: Secondary | ICD-10-CM | POA: Diagnosis not present

## 2020-09-20 DIAGNOSIS — G4730 Sleep apnea, unspecified: Secondary | ICD-10-CM | POA: Diagnosis not present

## 2020-09-20 DIAGNOSIS — R0602 Shortness of breath: Secondary | ICD-10-CM | POA: Diagnosis not present

## 2020-09-20 DIAGNOSIS — I2510 Atherosclerotic heart disease of native coronary artery without angina pectoris: Secondary | ICD-10-CM | POA: Diagnosis not present

## 2020-09-20 DIAGNOSIS — I201 Angina pectoris with documented spasm: Secondary | ICD-10-CM | POA: Diagnosis not present

## 2020-09-29 DIAGNOSIS — I10 Essential (primary) hypertension: Secondary | ICD-10-CM | POA: Diagnosis not present

## 2020-09-29 DIAGNOSIS — N1832 Chronic kidney disease, stage 3b: Secondary | ICD-10-CM | POA: Diagnosis not present

## 2020-09-30 DIAGNOSIS — I209 Angina pectoris, unspecified: Secondary | ICD-10-CM | POA: Diagnosis not present

## 2020-10-04 DIAGNOSIS — E785 Hyperlipidemia, unspecified: Secondary | ICD-10-CM | POA: Diagnosis not present

## 2020-10-04 DIAGNOSIS — I2510 Atherosclerotic heart disease of native coronary artery without angina pectoris: Secondary | ICD-10-CM | POA: Diagnosis not present

## 2020-10-27 ENCOUNTER — Other Ambulatory Visit: Payer: Self-pay | Admitting: Nurse Practitioner

## 2020-10-27 DIAGNOSIS — Z1231 Encounter for screening mammogram for malignant neoplasm of breast: Secondary | ICD-10-CM

## 2020-11-22 DIAGNOSIS — N1832 Chronic kidney disease, stage 3b: Secondary | ICD-10-CM | POA: Diagnosis not present

## 2020-12-05 ENCOUNTER — Other Ambulatory Visit: Payer: Self-pay

## 2020-12-05 ENCOUNTER — Ambulatory Visit
Admission: RE | Admit: 2020-12-05 | Discharge: 2020-12-05 | Disposition: A | Discharge status: Home / Self Care | Payer: Medicare Other | Source: Ambulatory Visit | Attending: Nurse Practitioner | Admitting: Nurse Practitioner

## 2020-12-05 DIAGNOSIS — Z1231 Encounter for screening mammogram for malignant neoplasm of breast: Secondary | ICD-10-CM | POA: Insufficient documentation

## 2020-12-28 ENCOUNTER — Ambulatory Visit: Payer: Medicare Other

## 2020-12-30 ENCOUNTER — Ambulatory Visit (INDEPENDENT_AMBULATORY_CARE_PROVIDER_SITE_OTHER): Payer: Medicare Other

## 2020-12-30 VITALS — Ht 61.0 in | Wt 177.0 lb

## 2020-12-30 DIAGNOSIS — Z0000 Encounter for general adult medical examination without abnormal findings: Secondary | ICD-10-CM | POA: Diagnosis not present
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
## 2020-12-30 NOTE — Patient Instructions (Signed)
[name] , Thank you for taking time to come for your Medicare Wellness Visit. I appreciate your ongoing commitment to your health goals. Please review the following plan we discussed and let me know if I can assist you in the future.   Screening recommendations/referrals: Colonoscopy: completed 07/03/2017, due 07/04/2022 Mammogram: completed 12/05/2020 Bone Density: completed 01/18/2016 Recommended yearly ophthalmology/optometry visit for glaucoma screening and checkup Recommended yearly dental visit for hygiene and checkup  Vaccinations: Influenza vaccine: due Pneumococcal vaccine: completed 09/18/2013 Tdap vaccine: completed 06/21/2018, due 06/20/2028 Shingles vaccine: discussed   Covid-19: 04/04/2020, 08/15/2019, 07/18/2019  Advanced directives: copy in chart  Conditions/risks identified: none  Next appointment: Follow up in one year for your annual wellness visit    Preventive Care 76 Years and Older, Female Preventive care refers to lifestyle choices and visits with your health care provider that can promote health and wellness. What does preventive care include? A yearly physical exam. This is also called an annual well check. Dental exams once or twice a year. Routine eye exams. Ask your health care provider how often you should have your eyes checked. Personal lifestyle choices, including: Daily care of your teeth and gums. Regular physical activity. Eating a healthy diet. Avoiding tobacco and drug use. Limiting alcohol use. Practicing safe sex. Taking low-dose aspirin every day. Taking vitamin and mineral supplements as recommended by your health care provider. What happens during an annual well check? The services and screenings done by your health care provider during your annual well check will depend on your age, overall health, lifestyle risk factors, and family history of disease. Counseling  Your health care provider may ask you questions about your: Alcohol  use. Tobacco use. Drug use. Emotional well-being. Home and relationship well-being. Sexual activity. Eating habits. History of falls. Memory and ability to understand (cognition). Work and work Statistician. Reproductive health. Screening  You may have the following tests or measurements: Height, weight, and BMI. Blood pressure. Lipid and cholesterol levels. These may be checked every 5 years, or more frequently if you are over 69 years old. Skin check. Lung cancer screening. You may have this screening every year starting at age 60 if you have a 30-pack-year history of smoking and currently smoke or have quit within the past 15 years. Fecal occult blood test (FOBT) of the stool. You may have this test every year starting at age 25. Flexible sigmoidoscopy or colonoscopy. You may have a sigmoidoscopy every 5 years or a colonoscopy every 10 years starting at age 37. Hepatitis C blood test. Hepatitis B blood test. Sexually transmitted disease (STD) testing. Diabetes screening. This is done by checking your blood sugar (glucose) after you have not eaten for a while (fasting). You may have this done every 1-3 years. Bone density scan. This is done to screen for osteoporosis. You may have this done starting at age 76. Mammogram. This may be done every 1-2 years. Talk to your health care provider about how often you should have regular mammograms. Talk with your health care provider about your test results, treatment options, and if necessary, the need for more tests. Vaccines  Your health care provider may recommend certain vaccines, such as: Influenza vaccine. This is recommended every year. Tetanus, diphtheria, and acellular pertussis (Tdap, Td) vaccine. You may need a Td booster every 10 years. Zoster vaccine. You may need this after age 42. Pneumococcal 13-valent conjugate (PCV13) vaccine. One dose is recommended after age 76. Pneumococcal polysaccharide (PPSV23) vaccine. One dose is  recommended  after age 73. Talk to your health care provider about which screenings and vaccines you need and how often you need them. This information is not intended to replace advice given to you by your health care provider. Make sure you discuss any questions you have with your health care provider. Document Released: 04/29/2015 Document Revised: 12/21/2015 Document Reviewed: 02/01/2015 Elsevier Interactive Patient Education  2017 [place] Prevention in the Home Falls can cause injuries. They can happen to people of all ages. There are many things you can do to make your home safe and to help prevent falls. What can I do on the outside of my home? Regularly fix the edges of walkways and driveways and fix any cracks. Remove anything that might make you trip as you walk through a door, such as a raised step or threshold. Trim any bushes or trees on the path to your home. Use bright outdoor lighting. Clear any walking paths of anything that might make someone trip, such as rocks or tools. Regularly check to see if handrails are loose or broken. Make sure that both sides of any steps have handrails. Any raised decks and porches should have guardrails on the edges. Have any leaves, snow, or ice cleared regularly. Use sand or salt on walking paths during winter. Clean up any spills in your garage right away. This includes oil or grease spills. What can I do in the bathroom? Use night lights. Install grab bars by the toilet and in the tub and shower. Do not use towel bars as grab bars. Use non-skid mats or decals in the tub or shower. If you need to sit down in the shower, use a plastic, non-slip stool. Keep the floor dry. Clean up any water that spills on the floor as soon as it happens. Remove soap buildup in the tub or shower regularly. Attach bath mats securely with double-sided non-slip rug tape. Do not have throw rugs and other things on the floor that can make you  trip. What can I do in the bedroom? Use night lights. Make sure that you have a light by your bed that is easy to reach. Do not use any sheets or blankets that are too big for your bed. They should not hang down onto the floor. Have a firm chair that has side arms. You can use this for support while you get dressed. Do not have throw rugs and other things on the floor that can make you trip. What can I do in the kitchen? Clean up any spills right away. Avoid walking on wet floors. Keep items that you use a lot in easy-to-reach places. If you need to reach something above you, use a strong step stool that has a grab bar. Keep electrical cords out of the way. Do not use floor polish or wax that makes floors slippery. If you must use wax, use non-skid floor wax. Do not have throw rugs and other things on the floor that can make you trip. What can I do with my stairs? Do not leave any items on the stairs. Make sure that there are handrails on both sides of the stairs and use them. Fix handrails that are broken or loose. Make sure that handrails are as long as the stairways. Check any carpeting to make sure that it is firmly attached to the stairs. Fix any carpet that is loose or worn. Avoid having throw rugs at the top or bottom of the stairs. If you do  have throw rugs, attach them to the floor with carpet tape. Make sure that you have a light switch at the top of the stairs and the bottom of the stairs. If you do not have them, ask someone to add them for you. What else can I do to help prevent falls? Wear shoes that: Do not have high heels. Have rubber bottoms. Are comfortable and fit you well. Are closed at the toe. Do not wear sandals. If you use a stepladder: Make sure that it is fully opened. Do not climb a closed stepladder. Make sure that both sides of the stepladder are locked into place. Ask someone to hold it for you, if possible. Clearly mark and make sure that you can  see: Any grab bars or handrails. First and last steps. Where the edge of each step is. Use tools that help you move around (mobility aids) if they are needed. These include: Canes. Walkers. Scooters. Crutches. Turn on the lights when you go into a dark area. Replace any light bulbs as soon as they burn out. Set up your furniture so you have a clear path. Avoid moving your furniture around. If any of your floors are uneven, fix them. If there are any pets around you, be aware of where they are. Review your medicines with your doctor. Some medicines can make you feel dizzy. This can increase your chance of falling. Ask your doctor what other things that you can do to help prevent falls. This information is not intended to replace advice given to you by your health care provider. Make sure you discuss any questions you have with your health care provider. Document Released: 01/27/2009 Document Revised: 09/08/2015 Document Reviewed: 05/07/2014 Elsevier Interactive Patient Education  2017 [employer].

## 2021-01-02 DIAGNOSIS — R000 Tachycardia, unspecified: Secondary | ICD-10-CM | POA: Diagnosis not present

## 2021-01-02 DIAGNOSIS — R531 Weakness: Secondary | ICD-10-CM | POA: Diagnosis not present

## 2021-01-02 DIAGNOSIS — I499 Cardiac arrhythmia, unspecified: Secondary | ICD-10-CM | POA: Diagnosis not present

## 2021-01-06 DIAGNOSIS — G4730 Sleep apnea, unspecified: Secondary | ICD-10-CM | POA: Diagnosis not present

## 2021-01-06 DIAGNOSIS — I4891 Unspecified atrial fibrillation: Secondary | ICD-10-CM | POA: Diagnosis not present

## 2021-01-06 DIAGNOSIS — I10 Essential (primary) hypertension: Secondary | ICD-10-CM | POA: Diagnosis not present

## 2021-01-06 DIAGNOSIS — I2510 Atherosclerotic heart disease of native coronary artery without angina pectoris: Secondary | ICD-10-CM | POA: Diagnosis not present

## 2021-01-06 DIAGNOSIS — R0602 Shortness of breath: Secondary | ICD-10-CM | POA: Diagnosis not present

## 2021-01-17 DIAGNOSIS — I2510 Atherosclerotic heart disease of native coronary artery without angina pectoris: Secondary | ICD-10-CM | POA: Diagnosis not present

## 2021-01-17 DIAGNOSIS — I4891 Unspecified atrial fibrillation: Secondary | ICD-10-CM | POA: Diagnosis not present

## 2021-01-17 DIAGNOSIS — I10 Essential (primary) hypertension: Secondary | ICD-10-CM | POA: Diagnosis not present

## 2021-01-17 DIAGNOSIS — G4730 Sleep apnea, unspecified: Secondary | ICD-10-CM | POA: Diagnosis not present

## 2021-02-03 ENCOUNTER — Ambulatory Visit: Payer: Medicare Other | Admitting: Nurse Practitioner

## 2021-02-03 ENCOUNTER — Encounter: Payer: Self-pay | Admitting: Nurse Practitioner

## 2021-02-03 ENCOUNTER — Ambulatory Visit (INDEPENDENT_AMBULATORY_CARE_PROVIDER_SITE_OTHER): Payer: Medicare Other | Admitting: Nurse Practitioner

## 2021-02-03 ENCOUNTER — Other Ambulatory Visit: Payer: Self-pay

## 2021-02-03 VITALS — BP 125/79 | HR 80 | Ht 61.0 in | Wt 177.0 lb

## 2021-02-03 DIAGNOSIS — E559 Vitamin D deficiency, unspecified: Secondary | ICD-10-CM | POA: Diagnosis not present

## 2021-02-03 DIAGNOSIS — I482 Chronic atrial fibrillation, unspecified: Secondary | ICD-10-CM | POA: Diagnosis not present

## 2021-02-03 DIAGNOSIS — M85852 Other specified disorders of bone density and structure, left thigh: Secondary | ICD-10-CM | POA: Diagnosis not present

## 2021-02-03 DIAGNOSIS — Z780 Asymptomatic menopausal state: Secondary | ICD-10-CM

## 2021-02-03 DIAGNOSIS — E079 Disorder of thyroid, unspecified: Secondary | ICD-10-CM

## 2021-02-03 DIAGNOSIS — N1832 Chronic kidney disease, stage 3b: Secondary | ICD-10-CM

## 2021-02-03 DIAGNOSIS — Z23 Encounter for immunization: Secondary | ICD-10-CM

## 2021-02-03 DIAGNOSIS — I700 Atherosclerosis of aorta: Secondary | ICD-10-CM

## 2021-02-03 DIAGNOSIS — D519 Vitamin B12 deficiency anemia, unspecified: Secondary | ICD-10-CM | POA: Diagnosis not present

## 2021-02-03 DIAGNOSIS — M109 Gout, unspecified: Secondary | ICD-10-CM | POA: Diagnosis not present

## 2021-02-03 DIAGNOSIS — E7800 Pure hypercholesterolemia, unspecified: Secondary | ICD-10-CM

## 2021-02-03 DIAGNOSIS — I10 Essential (primary) hypertension: Secondary | ICD-10-CM

## 2021-02-03 DIAGNOSIS — E538 Deficiency of other specified B group vitamins: Secondary | ICD-10-CM | POA: Diagnosis not present

## 2021-02-03 NOTE — Assessment & Plan Note (Signed)
Noted on DEXA scan in 2017. She is taking vitamin D and evista. Will check DEXA scan and see if need to continue this regimen.

## 2021-02-03 NOTE — Assessment & Plan Note (Signed)
Chronic, ongoing. Check TPO antibodies and thyroid panel today. Adjust regimen based on results.
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
## 2021-02-03 NOTE — Patient Instructions (Signed)
[facility]  Address: [address], [place], [place] [address]  Phone: [phone]  

## 2021-02-03 NOTE — Assessment & Plan Note (Addendum)
Chronic, stable. She was recently taken off hydralazine and blood pressure still well controlled. She is not checking her blood pressures at home. Continue collaboration with cardiology. Follow-up in 6 months. Check CMP, CBC today.

## 2021-02-03 NOTE — Assessment & Plan Note (Signed)
Will check vitamin D level today and adjust regimen as needed.  

## 2021-02-03 NOTE — Assessment & Plan Note (Signed)
Chronic, continue collaboration and current medications from cardiology.

## 2021-02-03 NOTE — Assessment & Plan Note (Signed)
Chronic, stable. No recent flares. Will check uric acid level today.

## 2021-02-03 NOTE — Assessment & Plan Note (Addendum)
Noted on CT imaging on 10/04/17. Continue statin and eliquis.

## 2021-02-03 NOTE — Assessment & Plan Note (Signed)
Chronic, stable. Follows with nephrology. Continue collaboration and recommendations from them.

## 2021-02-03 NOTE — Assessment & Plan Note (Signed)
Chronic, ongoing. Cardiology recently changed her from zocor to crestor. Continue collaboration and recommendations from cardiology. Check lipid panel today.

## 2021-02-03 NOTE — Assessment & Plan Note (Signed)
Currently taking daily supplement. Will check B12 level and adjust regimen as needed.  

## 2021-02-04 LAB — THYROID PANEL WITH TSH
Free Thyroxine Index: 2.7 (ref 1.2–4.9)
T3 Uptake Ratio: 26 % (ref 24–39)
T4, Total: 10.4 ug/dL (ref 4.5–12.0)
TSH: 0.951 u[IU]/mL (ref 0.450–4.500)

## 2021-02-04 LAB — LIPID PANEL W/O CHOL/HDL RATIO
Cholesterol, Total: 136 mg/dL (ref 100–199)
HDL: 79 mg/dL (ref >39)
LDL Chol Calc (NIH): 44 mg/dL (ref 0–99)
Triglycerides: 59 mg/dL (ref 0–149)
VLDL Cholesterol Cal: 13 mg/dL (ref 5–40)

## 2021-02-04 LAB — COMPREHENSIVE METABOLIC PANEL
ALT: 12 IU/L (ref 0–32)
AST: 16 IU/L (ref 0–40)
Albumin/Globulin Ratio: 2 (ref 1.2–2.2)
Albumin: 4.3 g/dL (ref 3.7–4.7)
Alkaline Phosphatase: 76 IU/L (ref 44–121)
BUN/Creatinine Ratio: 10 — ABNORMAL LOW (ref 12–28)
BUN: 13 mg/dL (ref 8–27)
Bilirubin Total: 0.6 mg/dL (ref 0.0–1.2)
CO2: 23 mmol/L (ref 20–29)
Calcium: 9.3 mg/dL (ref 8.7–10.3)
Chloride: 101 mmol/L (ref 96–106)
Creatinine, Ser: 1.27 mg/dL — ABNORMAL HIGH (ref 0.57–1.00)
Globulin, Total: 2.2 g/dL (ref 1.5–4.5)
Glucose: 95 mg/dL (ref 70–99)
Potassium: 4 mmol/L (ref 3.5–5.2)
Sodium: 139 mmol/L (ref 134–144)
Total Protein: 6.5 g/dL (ref 6.0–8.5)
eGFR: 44 mL/min/{1.73_m2} — ABNORMAL LOW (ref >59)

## 2021-02-04 LAB — CBC WITH DIFFERENTIAL/PLATELET
Basophils Absolute: 0.1 10*3/uL (ref 0.0–0.2)
Basos: 1 %
EOS (ABSOLUTE): 0.1 10*3/uL (ref 0.0–0.4)
Eos: 2 %
Hematocrit: 41.3 % (ref 34.0–46.6)
Hemoglobin: 13.6 g/dL (ref 11.1–15.9)
Immature Grans (Abs): 0 10*3/uL (ref 0.0–0.1)
Immature Granulocytes: 0 %
Lymphocytes Absolute: 1.4 10*3/uL (ref 0.7–3.1)
Lymphs: 23 %
MCH: 30.8 pg (ref 26.6–33.0)
MCHC: 32.9 g/dL (ref 31.5–35.7)
MCV: 93 fL (ref 79–97)
Monocytes Absolute: 0.3 10*3/uL (ref 0.1–0.9)
Monocytes: 5 %
Neutrophils Absolute: 4 10*3/uL (ref 1.4–7.0)
Neutrophils: 69 %
Platelets: 143 10*3/uL — ABNORMAL LOW (ref 150–450)
RBC: 4.42 x10E6/uL (ref 3.77–5.28)
RDW: 13.8 % (ref 11.7–15.4)
WBC: 5.8 10*3/uL (ref 3.4–10.8)

## 2021-02-04 LAB — URIC ACID: Uric Acid: 3.5 mg/dL (ref 3.1–7.9)

## 2021-02-04 LAB — THYROID PEROXIDASE ANTIBODY: Thyroperoxidase Ab SerPl-aCnc: 460 IU/mL — ABNORMAL HIGH (ref 0–34)

## 2021-02-04 LAB — VITAMIN B12: Vitamin B-12: 589 pg/mL (ref 232–1245)

## 2021-02-04 LAB — VITAMIN D 25 HYDROXY (VIT D DEFICIENCY, FRACTURES): Vit D, 25-Hydroxy: 39.7 ng/mL (ref 30.0–100.0)

## 2021-02-08 DIAGNOSIS — L578 Other skin changes due to chronic exposure to nonionizing radiation: Secondary | ICD-10-CM | POA: Diagnosis not present

## 2021-02-08 DIAGNOSIS — Z85828 Personal history of other malignant neoplasm of skin: Secondary | ICD-10-CM | POA: Diagnosis not present

## 2021-02-15 ENCOUNTER — Ambulatory Visit
Admission: RE | Admit: 2021-02-15 | Discharge: 2021-02-15 | Disposition: A | Discharge status: Home / Self Care | Payer: Medicare Other | Source: Ambulatory Visit | Attending: Nurse Practitioner | Admitting: Nurse Practitioner

## 2021-02-15 ENCOUNTER — Other Ambulatory Visit: Payer: Self-pay

## 2021-02-15 DIAGNOSIS — Z780 Asymptomatic menopausal state: Secondary | ICD-10-CM | POA: Diagnosis not present

## 2021-02-15 DIAGNOSIS — M85852 Other specified disorders of bone density and structure, left thigh: Secondary | ICD-10-CM | POA: Insufficient documentation

## 2021-02-15 DIAGNOSIS — M85832 Other specified disorders of bone density and structure, left forearm: Secondary | ICD-10-CM | POA: Diagnosis not present

## 2021-03-20 DIAGNOSIS — G4730 Sleep apnea, unspecified: Secondary | ICD-10-CM | POA: Diagnosis not present

## 2021-03-20 DIAGNOSIS — I2510 Atherosclerotic heart disease of native coronary artery without angina pectoris: Secondary | ICD-10-CM | POA: Diagnosis not present

## 2021-03-20 DIAGNOSIS — R0602 Shortness of breath: Secondary | ICD-10-CM | POA: Diagnosis not present

## 2021-03-20 DIAGNOSIS — I10 Essential (primary) hypertension: Secondary | ICD-10-CM | POA: Diagnosis not present

## 2021-03-20 DIAGNOSIS — I4891 Unspecified atrial fibrillation: Secondary | ICD-10-CM | POA: Diagnosis not present

## 2021-03-27 DIAGNOSIS — I10 Essential (primary) hypertension: Secondary | ICD-10-CM | POA: Diagnosis not present

## 2021-03-27 DIAGNOSIS — N1832 Chronic kidney disease, stage 3b: Secondary | ICD-10-CM | POA: Diagnosis not present

## 2021-04-06 DIAGNOSIS — N1832 Chronic kidney disease, stage 3b: Secondary | ICD-10-CM | POA: Diagnosis not present

## 2021-04-06 DIAGNOSIS — I10 Essential (primary) hypertension: Secondary | ICD-10-CM | POA: Diagnosis not present

## 2021-06-19 DIAGNOSIS — G4730 Sleep apnea, unspecified: Secondary | ICD-10-CM | POA: Diagnosis not present

## 2021-06-19 DIAGNOSIS — I10 Essential (primary) hypertension: Secondary | ICD-10-CM | POA: Diagnosis not present

## 2021-06-19 DIAGNOSIS — I4891 Unspecified atrial fibrillation: Secondary | ICD-10-CM | POA: Diagnosis not present

## 2021-06-19 DIAGNOSIS — E782 Mixed hyperlipidemia: Secondary | ICD-10-CM | POA: Diagnosis not present

## 2021-06-19 DIAGNOSIS — I2510 Atherosclerotic heart disease of native coronary artery without angina pectoris: Secondary | ICD-10-CM | POA: Diagnosis not present

## 2021-06-19 DIAGNOSIS — I340 Nonrheumatic mitral (valve) insufficiency: Secondary | ICD-10-CM | POA: Diagnosis not present

## 2021-08-03 NOTE — Patient Instructions (Signed)

## 2021-08-04 ENCOUNTER — Ambulatory Visit (INDEPENDENT_AMBULATORY_CARE_PROVIDER_SITE_OTHER): Payer: Medicare Other | Admitting: Nurse Practitioner

## 2021-08-04 ENCOUNTER — Encounter: Payer: Self-pay | Admitting: Nurse Practitioner

## 2021-08-04 VITALS — BP 130/78 | HR 89 | Temp 98.2°F | Ht 61.0 in | Wt 171.8 lb

## 2021-08-04 DIAGNOSIS — I10 Essential (primary) hypertension: Secondary | ICD-10-CM

## 2021-08-04 DIAGNOSIS — I700 Atherosclerosis of aorta: Secondary | ICD-10-CM

## 2021-08-04 DIAGNOSIS — D6869 Other thrombophilia: Secondary | ICD-10-CM | POA: Diagnosis not present

## 2021-08-04 DIAGNOSIS — E7800 Pure hypercholesterolemia, unspecified: Secondary | ICD-10-CM

## 2021-08-04 DIAGNOSIS — M1A3710 Chronic gout due to renal impairment, right ankle and foot, without tophus (tophi): Secondary | ICD-10-CM

## 2021-08-04 DIAGNOSIS — D696 Thrombocytopenia, unspecified: Secondary | ICD-10-CM

## 2021-08-04 DIAGNOSIS — M85852 Other specified disorders of bone density and structure, left thigh: Secondary | ICD-10-CM | POA: Diagnosis not present

## 2021-08-04 DIAGNOSIS — E063 Autoimmune thyroiditis: Secondary | ICD-10-CM | POA: Diagnosis not present

## 2021-08-04 DIAGNOSIS — I340 Nonrheumatic mitral (valve) insufficiency: Secondary | ICD-10-CM | POA: Diagnosis not present

## 2021-08-04 DIAGNOSIS — Z7901 Long term (current) use of anticoagulants: Secondary | ICD-10-CM | POA: Diagnosis not present

## 2021-08-04 DIAGNOSIS — E6609 Other obesity due to excess calories: Secondary | ICD-10-CM

## 2021-08-04 DIAGNOSIS — I482 Chronic atrial fibrillation, unspecified: Secondary | ICD-10-CM

## 2021-08-04 DIAGNOSIS — Z6832 Body mass index (BMI) 32.0-32.9, adult: Secondary | ICD-10-CM

## 2021-08-04 DIAGNOSIS — N1832 Chronic kidney disease, stage 3b: Secondary | ICD-10-CM | POA: Diagnosis not present

## 2021-08-04 MED ORDER — ALLOPURINOL 100 MG PO TABS: 100.0000 mg | ORAL_TABLET | Freq: Every day | ORAL | 4 refills | Status: DC

## 2021-08-04 NOTE — Progress Notes (Signed)
? ?BP 130/78   Pulse 89   Temp 98.2 ?F (36.8 ?C) (Oral)   Ht 5' 1"  (1.549 m)   Wt 171 lb 12.8 oz (77.9 kg)   LMP  (LMP Unknown)   SpO2 99%   BMI 32.46 kg/m?   ? ?Subjective:  ? ? Patient ID: Amy Myers, female    DOB: 12/11/1945, 76 y.o.   MRN: [id number] ? ?HPI: ?Amy Myers is a 76 y.o. female ? ?Chief Complaint  ?Patient presents with  ? Hyperlipidemia  ? Hypertension  ? Atrial Fibrillation  ? ?HYPERTENSION / HYPERLIPIDEMIA & A-FIB ?Goes to cardiology, Dr. Humphrey Rolls, and last saw in March 2023 (notes not available in chart).  No changes made, remains off Hydralazine and to take Metoprolol at night.  Continues on Metoprolol, Amlodipine, and Rosuvastatin + Eliquis.  Last echo 05/07/2018 -- noted EF 67%, mild LVH with Grade 3 diastolic dysfunction -- returns next month for echo.   ? ?History of aortic atherosclerosis noted on past imaging. ?Satisfied with current treatment? yes ?Duration of hypertension: chronic ?BP monitoring frequency: occasionally, machine broke ?BP range: 130/80 range ?BP medication side effects: no ?Duration of hyperlipidemia: chronic ?Cholesterol medication side effects: no ?Cholesterol supplements: none ?Medication compliance: good compliance ?Aspirin: no ?Recent stressors: no ?Recurrent headaches: no ?Visual changes: no ?Palpitations: no ?Dyspnea: no ?Chest pain: no ?Lower extremity edema: no ?Dizzy/lightheaded: no  ?  ?CHRONIC KIDNEY DISEASE/ANEMIA/GOUT ?Saw nephrology in 04/06/21 with CRT 1.38 and GFR 40, remaining baseline. Is taking B12 daily. Denies any anemia symptoms.   ?  ?Has underlying gout and takes Allopurinol twice daily, no recent flares.  Has not had a flare in many years. ?CKD status: stable ?Medications renally dose: yes ?Previous renal evaluation: yes ?Pneumovax:  Up to Date ?Influenza Vaccine:  Up to Date ?  ?ATRIAL FIBRILLATION ?Atrial fibrillation status: stable ?Satisfied with current treatment: yes  ?Medication side effects:  no ?Medication compliance: good  compliance ?Etiology of atrial fibrillation: unknown ?Palpitations:  no ?Chest pain:  no ?Dyspnea on exertion:  no ?Orthopnea:  no ?Syncope:  no ?Edema:  no ?Ventricular rate control: B-blocker ?Anti-coagulation: long acting ?  ?HYPOTHYROIDISM ?Continues on Levothyroxine 75 MCG. ?Thyroid control status:stable ?Satisfied with current treatment? yes ?Medication side effects: no ?Medication compliance: good compliance ?Etiology of hypothyroidism:  ?Recent dose adjustment:no ?Fatigue: no ?Cold intolerance: no ?Heat intolerance: no ?Weight gain: no ?Weight loss: no ?Constipation: sometimes ?Diarrhea/loose stools: no ?Palpitations: no ?Lower extremity edema: no ?Anxiety/depressed mood: no  ?  ?OSTEOPENIA ?Noted on imaging 01/18/2016 and on repeat DEXA 02/15/21 with T-score -1.7.  Continues on Vitamin D and B12 at home. ?Satisfied with current treatment?: yes ?Adequate calcium & vitamin D: yes ?Intolerance to bisphosphonates: Evista on holiday ?Weight bearing exercises: yes ? ?Relevant past medical, surgical, family and social history reviewed and updated as indicated. Interim medical history since our last visit reviewed. ?Allergies and medications reviewed and updated. ? ?Review of Systems ? ?Per HPI unless specifically indicated above ? ?   ?Objective:  ?  ?BP 130/78   Pulse 89   Temp 98.2 ?F (36.8 ?C) (Oral)   Ht 5' 1"  (1.549 m)   Wt 171 lb 12.8 oz (77.9 kg)   LMP  (LMP Unknown)   SpO2 99%   BMI 32.46 kg/m?   ?Wt Readings from Last 3 Encounters:  ?08/04/21 171 lb 12.8 oz (77.9 kg)  ?02/03/21 177 lb (80.3 kg)  ?12/30/20 177 lb (80.3 kg)  ?  ?Physical Exam ?Vitals and nursing  note reviewed.  ?Constitutional:   ?   General: She is awake. She is not in acute distress. ?   Appearance: She is well-developed and well-groomed. She is obese. She is not ill-appearing.  ?HENT:  ?   Head: Normocephalic.  ?   Right Ear: Hearing normal.  ?   Left Ear: Hearing normal.  ?Eyes:  ?   General: Lids are normal.     ?   Right eye: No  discharge.     ?   Left eye: No discharge.  ?   Conjunctiva/sclera: Conjunctivae normal.  ?   Pupils: Pupils are equal, round, and reactive to light.  ?Neck:  ?   Thyroid: No thyromegaly.  ?   Vascular: No carotid bruit.  ?Cardiovascular:  ?   Rate and Rhythm: Normal rate. Rhythm irregularly irregular.  ?   Heart sounds: Murmur heard.  ?Systolic murmur is present with a grade of 2/6.  ?  No gallop.  ?Pulmonary:  ?   Effort: Pulmonary effort is normal. No accessory muscle usage or respiratory distress.  ?   Breath sounds: Normal breath sounds.  ?Abdominal:  ?   General: Bowel sounds are normal.  ?   Palpations: Abdomen is soft.  ?Musculoskeletal:  ?   Cervical back: Normal range of motion and neck supple.  ?   Right lower leg: No edema.  ?   Left lower leg: No edema.  ?Skin: ?   General: Skin is warm and dry.  ?Neurological:  ?   Mental Status: She is alert and oriented to person, place, and time.  ?Psychiatric:     ?   Attention and Perception: Attention normal.     ?   Mood and Affect: Mood normal.     ?   Behavior: Behavior normal. Behavior is cooperative.     ?   Thought Content: Thought content normal.     ?   Judgment: Judgment normal.  ? ? ?Results for orders placed or performed in visit on 02/03/21  ?CBC with Differential/Platelet  ?Result Value Ref Range  ? WBC 5.8 3.4 - 10.8 x10E3/uL  ? RBC 4.42 3.77 - 5.28 x10E6/uL  ? Hemoglobin 13.6 11.1 - 15.9 g/dL  ? Hematocrit 41.3 34.0 - 46.6 %  ? MCV 93 79 - 97 fL  ? MCH 30.8 26.6 - 33.0 pg  ? MCHC 32.9 31.5 - 35.7 g/dL  ? RDW 13.8 11.7 - 15.4 %  ? Platelets 143 (L) 150 - 450 x10E3/uL  ? Neutrophils 69 Not Estab. %  ? Lymphs 23 Not Estab. %  ? Monocytes 5 Not Estab. %  ? Eos 2 Not Estab. %  ? Basos 1 Not Estab. %  ? Neutrophils Absolute 4.0 1.4 - 7.0 x10E3/uL  ? Lymphocytes Absolute 1.4 0.7 - 3.1 x10E3/uL  ? Monocytes Absolute 0.3 0.1 - 0.9 x10E3/uL  ? EOS (ABSOLUTE) 0.1 0.0 - 0.4 x10E3/uL  ? Basophils Absolute 0.1 0.0 - 0.2 x10E3/uL  ? Immature Granulocytes 0 Not  Estab. %  ? Immature Grans (Abs) 0.0 0.0 - 0.1 x10E3/uL  ?Comprehensive metabolic panel  ?Result Value Ref Range  ? Glucose 95 70 - 99 mg/dL  ? BUN 13 8 - 27 mg/dL  ? Creatinine, Ser 1.27 (H) 0.57 - 1.00 mg/dL  ? eGFR 44 (L) >59 mL/min/1.73  ? BUN/Creatinine Ratio 10 (L) 12 - 28  ? Sodium 139 134 - 144 mmol/L  ? Potassium 4.0 3.5 - 5.2 mmol/L  ? Chloride 101  96 - 106 mmol/L  ? CO2 23 20 - 29 mmol/L  ? Calcium 9.3 8.7 - 10.3 mg/dL  ? Total Protein 6.5 6.0 - 8.5 g/dL  ? Albumin 4.3 3.7 - 4.7 g/dL  ? Globulin, Total 2.2 1.5 - 4.5 g/dL  ? Albumin/Globulin Ratio 2.0 1.2 - 2.2  ? Bilirubin Total 0.6 0.0 - 1.2 mg/dL  ? Alkaline Phosphatase 76 44 - 121 IU/L  ? AST 16 0 - 40 IU/L  ? ALT 12 0 - 32 IU/L  ?Lipid Panel w/o Chol/HDL Ratio  ?Result Value Ref Range  ? Cholesterol, Total 136 100 - 199 mg/dL  ? Triglycerides 59 0 - 149 mg/dL  ? HDL 79 >39 mg/dL  ? VLDL Cholesterol Cal 13 5 - 40 mg/dL  ? LDL Chol Calc (NIH) 44 0 - 99 mg/dL  ?Thyroid Panel With TSH  ?Result Value Ref Range  ? TSH 0.951 0.450 - 4.500 uIU/mL  ? T4, Total 10.4 4.5 - 12.0 ug/dL  ? T3 Uptake Ratio 26 24 - 39 %  ? Free Thyroxine Index 2.7 1.2 - 4.9  ?Thyroid peroxidase antibody  ?Result Value Ref Range  ? Thyroperoxidase Ab SerPl-aCnc 460 (H) 0 - 34 IU/mL  ?Vitamin D (25 hydroxy)  ?Result Value Ref Range  ? Vit D, 25-Hydroxy 39.7 30.0 - 100.0 ng/mL  ?Vitamin B12  ?Result Value Ref Range  ? Vitamin B-12 589 232 - 1,245 pg/mL  ?Uric acid  ?Result Value Ref Range  ? Uric Acid 3.5 3.1 - 7.9 mg/dL  ? ?   ?Assessment & Plan:  ? ?Problem List Items Addressed This Visit   ? ?  ? Cardiovascular and Mediastinum  ? Aortic atherosclerosis ([place])  ?  Noted on CT imaging 10/04/17.  Educated patient, continue statin and Eliquis daily.  Recommend focus on healthy diet and regular activity. ? ?  ?  ? Relevant Orders  ? Comprehensive metabolic panel  ? Lipid Panel w/o Chol/HDL Ratio  ? Chronic atrial fibrillation (HCC) - Primary  ?  Chronic, rate controlled.  Continue current  medication regimen and collaboration with cardiology (Dr. Humphrey Rolls).   ? ?  ?  ? Hypertension  ?  Chronic, ongoing with BP at goal on home readings and in office today.  Followed by cardiology and nephrology.  Continue curre

## 2021-08-04 NOTE — Assessment & Plan Note (Signed)
Chronic, ongoing with a-fib.  Continue current medication regimen and check CBC.  Continue collaboration with cardiology. 

## 2021-08-04 NOTE — Assessment & Plan Note (Signed)
Chronic, ongoing.  Continue current medication regimen and adjust as needed based on labs.  Lipid panel today and CMP. 

## 2021-08-04 NOTE — Assessment & Plan Note (Signed)
Continues on Eliquis for atrial fibrillation.  Recent CBC with nephrology remains stable.  She is to notify provider if any increased bruising or skin breakdown. 

## 2021-08-04 NOTE — Assessment & Plan Note (Signed)
BMI 32.46 with a-fib and HTN + CKD.  Recommend continued focus on healthy diet choices and regular physical activity (30 minutes 5 days a week). ? ?

## 2021-08-04 NOTE — Assessment & Plan Note (Signed)
Chronic, rate controlled.  Continue current medication regimen and collaboration with cardiology (Dr. Humphrey Rolls).   ?

## 2021-08-04 NOTE — Assessment & Plan Note (Signed)
Chronic, stable with no recent flares.  Continue Allopurinol, but reduce to renal dosing 100 MG, and check uric acid today. ?

## 2021-08-04 NOTE — Assessment & Plan Note (Signed)
Chronic, ongoing.  Continue current medication regimen and adjust as needed.  Recheck thyroid panel in November next.  ATA guidelines recommend goal for age <6.   ?

## 2021-08-04 NOTE — Assessment & Plan Note (Signed)
Chronic, stable with no decline noted on recent nephrology labs.  Continue collaboration with nephrology.  No current ACE or ARB, may benefit from this in future if no past side effects with these.  CMP and CBC today.  Renal dose medications as needed. ?

## 2021-08-04 NOTE — Assessment & Plan Note (Signed)
Chronic, ongoing with BP at goal on home readings and in office today.  Followed by cardiology and nephrology.  Continue current medication regimen and adjust as needed.  Continue collaboration with cardiologist and nephrologist.  LABS: CBC & CMP.  Recommend she continue to monitor BP at home a few days a week and document + focus on DASH diet.  Return in 6 months. ?

## 2021-08-04 NOTE — Assessment & Plan Note (Signed)
Noted on on past labs intermittently.  Continue to monitor.  CBC today. 

## 2021-08-04 NOTE — Assessment & Plan Note (Signed)
Noted on past echo, mild to moderate.  No symptoms at this time.  Continue current medication regimen and collaboration with cardiology. 

## 2021-08-04 NOTE — Assessment & Plan Note (Signed)
Noted on CT imaging 10/04/17.  Educated patient, continue statin and Eliquis daily.  Recommend focus on healthy diet and regular activity. ?

## 2021-08-04 NOTE — Assessment & Plan Note (Signed)
Noted on 2017 DEXA and repeat in November 2022 (T-score -1.7).  Continue supplements at home and gentle weight bearing exercises.  Check Vit D next visit, recent was stable. Continue Evista holiday. ?

## 2021-08-05 LAB — CBC WITH DIFFERENTIAL/PLATELET
Basophils Absolute: 0.1 10*3/uL (ref 0.0–0.2)
Basos: 1 %
EOS (ABSOLUTE): 0.2 10*3/uL (ref 0.0–0.4)
Eos: 3 %
Hematocrit: 38.2 % (ref 34.0–46.6)
Hemoglobin: 12.5 g/dL (ref 11.1–15.9)
Immature Grans (Abs): 0 10*3/uL (ref 0.0–0.1)
Immature Granulocytes: 0 %
Lymphocytes Absolute: 1.4 10*3/uL (ref 0.7–3.1)
Lymphs: 27 %
MCH: 30.3 pg (ref 26.6–33.0)
MCHC: 32.7 g/dL (ref 31.5–35.7)
MCV: 93 fL (ref 79–97)
Monocytes Absolute: 0.3 10*3/uL (ref 0.1–0.9)
Monocytes: 6 %
Neutrophils Absolute: 3.1 10*3/uL (ref 1.4–7.0)
Neutrophils: 63 %
Platelets: 129 10*3/uL — ABNORMAL LOW (ref 150–450)
RBC: 4.13 x10E6/uL (ref 3.77–5.28)
RDW: 14.1 % (ref 11.7–15.4)
WBC: 5 10*3/uL (ref 3.4–10.8)

## 2021-08-05 LAB — COMPREHENSIVE METABOLIC PANEL
ALT: 16 IU/L (ref 0–32)
AST: 19 IU/L (ref 0–40)
Albumin/Globulin Ratio: 2.5 — ABNORMAL HIGH (ref 1.2–2.2)
Albumin: 4.5 g/dL (ref 3.7–4.7)
Alkaline Phosphatase: 92 IU/L (ref 44–121)
BUN/Creatinine Ratio: 13 (ref 12–28)
BUN: 16 mg/dL (ref 8–27)
Bilirubin Total: 0.5 mg/dL (ref 0.0–1.2)
CO2: 25 mmol/L (ref 20–29)
Calcium: 9.6 mg/dL (ref 8.7–10.3)
Chloride: 102 mmol/L (ref 96–106)
Creatinine, Ser: 1.23 mg/dL — ABNORMAL HIGH (ref 0.57–1.00)
Globulin, Total: 1.8 g/dL (ref 1.5–4.5)
Glucose: 103 mg/dL — ABNORMAL HIGH (ref 70–99)
Potassium: 4.2 mmol/L (ref 3.5–5.2)
Sodium: 140 mmol/L (ref 134–144)
Total Protein: 6.3 g/dL (ref 6.0–8.5)
eGFR: 46 mL/min/{1.73_m2} — ABNORMAL LOW (ref >59)

## 2021-08-05 LAB — LIPID PANEL W/O CHOL/HDL RATIO
Cholesterol, Total: 138 mg/dL (ref 100–199)
HDL: 83 mg/dL (ref >39)
LDL Chol Calc (NIH): 42 mg/dL (ref 0–99)
Triglycerides: 59 mg/dL (ref 0–149)
VLDL Cholesterol Cal: 13 mg/dL (ref 5–40)

## 2021-08-05 LAB — URIC ACID: Uric Acid: 3.4 mg/dL (ref 3.1–7.9)

## 2021-08-05 NOTE — Progress Notes (Signed)
Good morning crew, please let Amy Myers know her labs have returned: ?- Kidney disease remains at baseline with no worsening, continue follow-up with kidney doctor and remember to decrease Allopurinol to 100 MG daily (one pill).  Uric acid level remains stable. ?- Platelet count, what helps to clot your blood, remains a little one low side but no significant worsening.  We will continue to monitor. ?- Cholesterol labs are at goal. Continue all current medications.  Any questions? ?Keep being amazing!!  Thank you for allowing me to participate in your care.  I appreciate you. ?Kindest regards, ?Wali Reinheimer ?

## 2021-08-11 DIAGNOSIS — H35363 Drusen (degenerative) of macula, bilateral: Secondary | ICD-10-CM | POA: Diagnosis not present

## 2021-08-11 DIAGNOSIS — H25013 Cortical age-related cataract, bilateral: Secondary | ICD-10-CM | POA: Diagnosis not present

## 2021-08-11 DIAGNOSIS — H2513 Age-related nuclear cataract, bilateral: Secondary | ICD-10-CM | POA: Diagnosis not present

## 2021-08-21 DIAGNOSIS — I340 Nonrheumatic mitral (valve) insufficiency: Secondary | ICD-10-CM | POA: Diagnosis not present

## 2021-08-28 ENCOUNTER — Other Ambulatory Visit: Payer: Self-pay | Admitting: Nurse Practitioner

## 2021-08-28 DIAGNOSIS — I10 Essential (primary) hypertension: Secondary | ICD-10-CM | POA: Diagnosis not present

## 2021-08-28 DIAGNOSIS — I4891 Unspecified atrial fibrillation: Secondary | ICD-10-CM | POA: Diagnosis not present

## 2021-08-28 DIAGNOSIS — E782 Mixed hyperlipidemia: Secondary | ICD-10-CM | POA: Diagnosis not present

## 2021-08-28 DIAGNOSIS — G4730 Sleep apnea, unspecified: Secondary | ICD-10-CM | POA: Diagnosis not present

## 2021-08-28 DIAGNOSIS — I340 Nonrheumatic mitral (valve) insufficiency: Secondary | ICD-10-CM | POA: Diagnosis not present

## 2021-08-28 DIAGNOSIS — I2510 Atherosclerotic heart disease of native coronary artery without angina pectoris: Secondary | ICD-10-CM | POA: Diagnosis not present

## 2021-08-29 NOTE — Telephone Encounter (Signed)
Requested Prescriptions  ?Pending Prescriptions Disp Refills  ?? levothyroxine (SYNTHROID) 75 MCG tablet [Pharmacy Med Name: LEVOTHYROXINE 75 MCG TABLET] 90 tablet 0  ?  Sig: Take 1 tablet (75 mcg total) by mouth daily.  ?  ? Endocrinology:  Hypothyroid Agents Passed - 08/28/2021 10:49 AM  ?  ?  Passed - TSH in normal range and within 360 days  ?  TSH  ?Date Value Ref Range Status  ?02/03/2021 0.951 0.450 - 4.500 uIU/mL Final  ?   ?  ?  Passed - Valid encounter within last 12 months  ?  Recent Outpatient Visits   ?      ? 3 weeks ago Chronic atrial fibrillation ([place])  ? [facility] [place], [place] T, NP  ? 6 months ago Chronic atrial fibrillation [facility])  ? [place], NP  ? 1 year ago Chronic atrial fibrillation ([place])  ? [place], [place] T, NP  ? 1 year ago Chronic atrial fibrillation [facility])  ? [place], [place] T, NP  ? 2 years ago Chronic atrial fibrillation [facility])  ? [facility] [place], Henrine Screws T, NP  ?  ?  ?Future Appointments   ?        ? In 4 months  [facility]  ? In 5 months [place], Barbaraann Faster, NP [employer], [facility]  ?  ? ?  ?  ?  ? ? ?

## 2021-10-18 ENCOUNTER — Other Ambulatory Visit: Payer: Self-pay | Admitting: Nurse Practitioner

## 2021-10-18 MED ORDER — ROSUVASTATIN CALCIUM 20 MG PO TABS: 20.0000 mg | ORAL_TABLET | Freq: Every day | ORAL | 4 refills | Status: DC

## 2021-10-23 DIAGNOSIS — H53451 Other localized visual field defect, right eye: Secondary | ICD-10-CM | POA: Diagnosis not present

## 2021-10-23 DIAGNOSIS — H47093 Other disorders of optic nerve, not elsewhere classified, bilateral: Secondary | ICD-10-CM | POA: Diagnosis not present

## 2021-10-23 DIAGNOSIS — H2513 Age-related nuclear cataract, bilateral: Secondary | ICD-10-CM | POA: Diagnosis not present

## 2021-10-23 DIAGNOSIS — H35363 Drusen (degenerative) of macula, bilateral: Secondary | ICD-10-CM | POA: Diagnosis not present

## 2021-10-23 DIAGNOSIS — H25013 Cortical age-related cataract, bilateral: Secondary | ICD-10-CM | POA: Diagnosis not present

## 2021-10-24 DIAGNOSIS — N1832 Chronic kidney disease, stage 3b: Secondary | ICD-10-CM | POA: Diagnosis not present

## 2021-10-24 DIAGNOSIS — I10 Essential (primary) hypertension: Secondary | ICD-10-CM | POA: Diagnosis not present

## 2021-10-30 ENCOUNTER — Other Ambulatory Visit: Payer: Self-pay | Admitting: Nurse Practitioner

## 2021-10-30 DIAGNOSIS — Z1231 Encounter for screening mammogram for malignant neoplasm of breast: Secondary | ICD-10-CM

## 2021-10-30 DIAGNOSIS — I129 Hypertensive chronic kidney disease with stage 1 through stage 4 chronic kidney disease, or unspecified chronic kidney disease: Secondary | ICD-10-CM | POA: Diagnosis not present

## 2021-10-30 DIAGNOSIS — N1832 Chronic kidney disease, stage 3b: Secondary | ICD-10-CM | POA: Diagnosis not present

## 2021-12-06 ENCOUNTER — Ambulatory Visit
Admission: RE | Admit: 2021-12-06 | Discharge: 2021-12-06 | Disposition: A | Discharge status: Home / Self Care | Payer: Medicare Other | Source: Ambulatory Visit | Attending: Nurse Practitioner | Admitting: Nurse Practitioner

## 2021-12-06 DIAGNOSIS — Z1231 Encounter for screening mammogram for malignant neoplasm of breast: Secondary | ICD-10-CM | POA: Insufficient documentation

## 2021-12-07 NOTE — Progress Notes (Signed)
Please let Jacqlyn Larsen know her mammogram has returned and is normal.  Repeat in one year.

## 2021-12-29 DIAGNOSIS — E782 Mixed hyperlipidemia: Secondary | ICD-10-CM | POA: Diagnosis not present

## 2021-12-29 DIAGNOSIS — I2510 Atherosclerotic heart disease of native coronary artery without angina pectoris: Secondary | ICD-10-CM | POA: Diagnosis not present

## 2021-12-29 DIAGNOSIS — I4891 Unspecified atrial fibrillation: Secondary | ICD-10-CM | POA: Diagnosis not present

## 2021-12-29 DIAGNOSIS — G4730 Sleep apnea, unspecified: Secondary | ICD-10-CM | POA: Diagnosis not present

## 2021-12-29 DIAGNOSIS — I340 Nonrheumatic mitral (valve) insufficiency: Secondary | ICD-10-CM | POA: Diagnosis not present

## 2021-12-29 DIAGNOSIS — I10 Essential (primary) hypertension: Secondary | ICD-10-CM | POA: Diagnosis not present

## 2022-01-01 ENCOUNTER — Ambulatory Visit (INDEPENDENT_AMBULATORY_CARE_PROVIDER_SITE_OTHER): Payer: Medicare Other | Admitting: *Deleted

## 2022-01-01 DIAGNOSIS — Z0000 Encounter for general adult medical examination without abnormal findings: Secondary | ICD-10-CM
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
## 2022-01-01 NOTE — Patient Instructions (Signed)
Amy Myers , Thank you for taking time to come for your Medicare Wellness Visit. I appreciate your ongoing commitment to your health goals. Please review the following plan we discussed and let me know if I can assist you in the future.   Screening recommendations/referrals: Colonoscopy: no longer required Mammogram: up to date Bone Density: Education provided Recommended yearly ophthalmology/optometry visit for glaucoma screening and checkup Recommended yearly dental visit for hygiene and checkup  Vaccinations: Influenza vaccine: Education provided Pneumococcal vaccine: up to date Tdap vaccine: up to date Shingles vaccine: Education provided    Advanced directives: on file  Conditions/risks identified:   Next appointment: 01-31-2022 @ 10:00  [place] 65 Years and Older, Female Preventive care refers to lifestyle choices and visits with your health care provider that can promote health and wellness. What does preventive care include? A yearly physical exam. This is also called an annual well check. Dental exams once or twice a year. Routine eye exams. Ask your health care provider how often you should have your eyes checked. Personal lifestyle choices, including: Daily care of your teeth and gums. Regular physical activity. Eating a healthy diet. Avoiding tobacco and drug use. Limiting alcohol use. Practicing safe sex. Taking low-dose aspirin every day. Taking vitamin and mineral supplements as recommended by your health care provider. What happens during an annual well check? The services and screenings done by your health care provider during your annual well check will depend on your age, overall health, lifestyle risk factors, and family history of disease. Counseling  Your health care provider may ask you questions about your: Alcohol use. Tobacco use. Drug use. Emotional well-being. Home and relationship well-being. Sexual activity. Eating  habits. History of falls. Memory and ability to understand (cognition). Work and work Statistician. Reproductive health. Screening  You may have the following tests or measurements: Height, weight, and BMI. Blood pressure. Lipid and cholesterol levels. These may be checked every 5 years, or more frequently if you are over 70 years old. Skin check. Lung cancer screening. You may have this screening every year starting at age 60 if you have a 30-pack-year history of smoking and currently smoke or have quit within the past 15 years. Fecal occult blood test (FOBT) of the stool. You may have this test every year starting at age 58. Flexible sigmoidoscopy or colonoscopy. You may have a sigmoidoscopy every 5 years or a colonoscopy every 10 years starting at age 15. Hepatitis C blood test. Hepatitis B blood test. Sexually transmitted disease (STD) testing. Diabetes screening. This is done by checking your blood sugar (glucose) after you have not eaten for a while (fasting). You may have this done every 1-3 years. Bone density scan. This is done to screen for osteoporosis. You may have this done starting at age 57. Mammogram. This may be done every 1-2 years. Talk to your health care provider about how often you should have regular mammograms. Talk with your health care provider about your test results, treatment options, and if necessary, the need for more tests. Vaccines  Your health care provider may recommend certain vaccines, such as: Influenza vaccine. This is recommended every year. Tetanus, diphtheria, and acellular pertussis (Tdap, Td) vaccine. You may need a Td booster every 10 years. Zoster vaccine. You may need this after age 75. Pneumococcal 13-valent conjugate (PCV13) vaccine. One dose is recommended after age 70. Pneumococcal polysaccharide (PPSV23) vaccine. One dose is recommended after age 15. Talk to your health care provider about  which screenings and vaccines you need and how  often you need them. This information is not intended to replace advice given to you by your health care provider. Make sure you discuss any questions you have with your health care provider. Document Released: 04/29/2015 Document Revised: 12/21/2015 Document Reviewed: 02/01/2015 Elsevier Interactive Patient Education  2017 [place] Prevention in the Home Falls can cause injuries. They can happen to people of all ages. There are many things you can do to make your home safe and to help prevent falls. What can I do on the outside of my home? Regularly fix the edges of walkways and driveways and fix any cracks. Remove anything that might make you trip as you walk through a door, such as a raised step or threshold. Trim any bushes or trees on the path to your home. Use bright outdoor lighting. Clear any walking paths of anything that might make someone trip, such as rocks or tools. Regularly check to see if handrails are loose or broken. Make sure that both sides of any steps have handrails. Any raised decks and porches should have guardrails on the edges. Have any leaves, snow, or ice cleared regularly. Use sand or salt on walking paths during winter. Clean up any spills in your garage right away. This includes oil or grease spills. What can I do in the bathroom? Use night lights. Install grab bars by the toilet and in the tub and shower. Do not use towel bars as grab bars. Use non-skid mats or decals in the tub or shower. If you need to sit down in the shower, use a plastic, non-slip stool. Keep the floor dry. Clean up any water that spills on the floor as soon as it happens. Remove soap buildup in the tub or shower regularly. Attach bath mats securely with double-sided non-slip rug tape. Do not have throw rugs and other things on the floor that can make you trip. What can I do in the bedroom? Use night lights. Make sure that you have a light by your bed that is easy to  reach. Do not use any sheets or blankets that are too big for your bed. They should not hang down onto the floor. Have a firm chair that has side arms. You can use this for support while you get dressed. Do not have throw rugs and other things on the floor that can make you trip. What can I do in the kitchen? Clean up any spills right away. Avoid walking on wet floors. Keep items that you use a lot in easy-to-reach places. If you need to reach something above you, use a strong step stool that has a grab bar. Keep electrical cords out of the way. Do not use floor polish or wax that makes floors slippery. If you must use wax, use non-skid floor wax. Do not have throw rugs and other things on the floor that can make you trip. What can I do with my stairs? Do not leave any items on the stairs. Make sure that there are handrails on both sides of the stairs and use them. Fix handrails that are broken or loose. Make sure that handrails are as long as the stairways. Check any carpeting to make sure that it is firmly attached to the stairs. Fix any carpet that is loose or worn. Avoid having throw rugs at the top or bottom of the stairs. If you do have throw rugs, attach them to the floor with  carpet tape. Make sure that you have a light switch at the top of the stairs and the bottom of the stairs. If you do not have them, ask someone to add them for you. What else can I do to help prevent falls? Wear shoes that: Do not have high heels. Have rubber bottoms. Are comfortable and fit you well. Are closed at the toe. Do not wear sandals. If you use a stepladder: Make sure that it is fully opened. Do not climb a closed stepladder. Make sure that both sides of the stepladder are locked into place. Ask someone to hold it for you, if possible. Clearly mark and make sure that you can see: Any grab bars or handrails. First and last steps. Where the edge of each step is. Use tools that help you move  around (mobility aids) if they are needed. These include: Canes. Walkers. Scooters. Crutches. Turn on the lights when you go into a dark area. Replace any light bulbs as soon as they burn out. Set up your furniture so you have a clear path. Avoid moving your furniture around. If any of your floors are uneven, fix them. If there are any pets around you, be aware of where they are. Review your medicines with your doctor. Some medicines can make you feel dizzy. This can increase your chance of falling. Ask your doctor what other things that you can do to help prevent falls. This information is not intended to replace advice given to you by your health care provider. Make sure you discuss any questions you have with your health care provider. Document Released: 01/27/2009 Document Revised: 09/08/2015 Document Reviewed: 05/07/2014 Elsevier Interactive Patient Education  2017 [employer].

## 2022-01-28 NOTE — Patient Instructions (Incomplete)
X-rays:  Address: [address], [place] [address] Hours: Closes 5?PM Phone: [phone]  Start Magnesium 400 MG at night.  Hypothyroidism  Hypothyroidism is when the thyroid gland does not make enough of certain hormones. This is called an underactive thyroid. The thyroid gland is a small gland located in the lower front part of the neck, just in front of the windpipe (trachea). This gland makes hormones that help control how the body uses food for energy (metabolism) as well as how the heart and brain function. These hormones also play a role in keeping your bones strong. When the thyroid is underactive, it produces too little of the hormones thyroxine (T4) and triiodothyronine (T3). What are the causes? This condition may be caused by: Hashimoto's disease. This is a disease in which the body's disease-fighting system (immune system) attacks the thyroid gland. This is the most common cause. Viral infections. Pregnancy. Certain medicines. Birth defects. Problems with a gland in the center of the brain (pituitary gland). Lack of enough iodine in the diet. Other causes may include: Past radiation treatments to the head or neck for cancer. Past treatment with radioactive iodine. Past exposure to radiation in the environment. Past surgical removal of part or all of the thyroid. What increases the risk? You are more likely to develop this condition if: You are female. You have a family history of thyroid conditions. You use a medicine called lithium. You take medicines that affect the immune system (immunosuppressants). What are the signs or symptoms? Common symptoms of this condition include: Not being able to tolerate cold. Feeling as though you have no energy (lethargy). Lack of appetite. Constipation. Sadness or depression. Weight gain that is not explained by a change in diet or exercise habits. Menstrual irregularity. Dry skin, coarse hair, or brittle nails. Other symptoms  may include: Muscle pain. Slowing of thought processes. Poor memory. How is this diagnosed? This condition may be diagnosed based on: Your symptoms, your medical history, and a physical exam. Blood tests. You may also have imaging tests, such as an ultrasound or MRI. How is this treated? This condition is treated with medicine that replaces the thyroid hormones that your body does not make. After you begin treatment, it may take several weeks for symptoms to go away. Follow these instructions at home: Take over-the-counter and prescription medicines only as told by your health care provider. If you start taking any new medicines, tell your health care provider. Keep all follow-up visits as told by your health care provider. This is important. As your condition improves, your dosage of thyroid hormone medicine may change. You will need to have blood tests regularly so that your health care provider can monitor your condition. Contact a health care provider if: Your symptoms do not get better with treatment. You are taking thyroid hormone replacement medicine and you: Sweat a lot. Have tremors. Feel anxious. Lose weight rapidly. Cannot tolerate heat. Have emotional swings. Have diarrhea. Feel weak. Get help right away if: You have chest pain. You have an irregular heartbeat. You have a rapid heartbeat. You have difficulty breathing. These symptoms may be an emergency. Get help right away. Call 911. Do not wait to see if the symptoms will go away. Do not drive yourself to the hospital. Summary Hypothyroidism is when the thyroid gland does not make enough of certain hormones (it is underactive). When the thyroid is underactive, it produces too little of the hormones thyroxine (T4) and triiodothyronine (T3). The most common  cause is Hashimoto's disease, a disease in which the body's disease-fighting system (immune system) attacks the thyroid gland. The condition can also be caused  by viral infections, medicine, pregnancy, or past radiation treatment to the head or neck. Symptoms may include weight gain, dry skin, constipation, feeling as though you do not have energy, and not being able to tolerate cold. This condition is treated with medicine to replace the thyroid hormones that your body does not make. This information is not intended to replace advice given to you by your health care provider. Make sure you discuss any questions you have with your health care provider. Document Revised: 04/04/2021 Document Reviewed: 04/04/2021 Elsevier Patient Education  [place].

## 2022-01-31 ENCOUNTER — Ambulatory Visit (INDEPENDENT_AMBULATORY_CARE_PROVIDER_SITE_OTHER): Payer: Medicare Other | Admitting: Nurse Practitioner

## 2022-01-31 ENCOUNTER — Encounter: Payer: Self-pay | Admitting: Nurse Practitioner

## 2022-01-31 VITALS — BP 130/68 | HR 90 | Temp 97.9°F | Ht 61.0 in | Wt 173.9 lb

## 2022-01-31 DIAGNOSIS — Z7901 Long term (current) use of anticoagulants: Secondary | ICD-10-CM | POA: Diagnosis not present

## 2022-01-31 DIAGNOSIS — D696 Thrombocytopenia, unspecified: Secondary | ICD-10-CM

## 2022-01-31 DIAGNOSIS — M1A3720 Chronic gout due to renal impairment, left ankle and foot, without tophus (tophi): Secondary | ICD-10-CM

## 2022-01-31 DIAGNOSIS — E7800 Pure hypercholesterolemia, unspecified: Secondary | ICD-10-CM | POA: Diagnosis not present

## 2022-01-31 DIAGNOSIS — I340 Nonrheumatic mitral (valve) insufficiency: Secondary | ICD-10-CM | POA: Diagnosis not present

## 2022-01-31 DIAGNOSIS — I482 Chronic atrial fibrillation, unspecified: Secondary | ICD-10-CM

## 2022-01-31 DIAGNOSIS — Z6832 Body mass index (BMI) 32.0-32.9, adult: Secondary | ICD-10-CM

## 2022-01-31 DIAGNOSIS — Z23 Encounter for immunization: Secondary | ICD-10-CM

## 2022-01-31 DIAGNOSIS — D6869 Other thrombophilia: Secondary | ICD-10-CM | POA: Diagnosis not present

## 2022-01-31 DIAGNOSIS — N1832 Chronic kidney disease, stage 3b: Secondary | ICD-10-CM

## 2022-01-31 DIAGNOSIS — E063 Autoimmune thyroiditis: Secondary | ICD-10-CM

## 2022-01-31 DIAGNOSIS — E538 Deficiency of other specified B group vitamins: Secondary | ICD-10-CM

## 2022-01-31 DIAGNOSIS — M85852 Other specified disorders of bone density and structure, left thigh: Secondary | ICD-10-CM | POA: Diagnosis not present

## 2022-01-31 DIAGNOSIS — I10 Essential (primary) hypertension: Secondary | ICD-10-CM | POA: Diagnosis not present

## 2022-01-31 DIAGNOSIS — E559 Vitamin D deficiency, unspecified: Secondary | ICD-10-CM

## 2022-01-31 DIAGNOSIS — I700 Atherosclerosis of aorta: Secondary | ICD-10-CM

## 2022-01-31 DIAGNOSIS — E6609 Other obesity due to excess calories: Secondary | ICD-10-CM

## 2022-01-31 DIAGNOSIS — R200 Anesthesia of skin: Secondary | ICD-10-CM | POA: Insufficient documentation

## 2022-01-31 LAB — MICROALBUMIN, URINE WAIVED
Creatinine, Urine Waived: 50 mg/dL (ref 10–300)
Microalb, Ur Waived: 30 mg/L — ABNORMAL HIGH (ref 0–19)

## 2022-01-31 MED ORDER — LEVOTHYROXINE SODIUM 75 MCG PO TABS: 75.0000 ug | ORAL_TABLET | Freq: Every day | ORAL | 4 refills | Status: DC

## 2022-01-31 NOTE — Assessment & Plan Note (Signed)
Currently taking daily supplement. Will check B12 level and adjust regimen as needed.

## 2022-01-31 NOTE — Assessment & Plan Note (Addendum)
Chronic, stable with no recent flares.  Continue Allopurinol, but reduce to renal dosing 100 MG, and check uric acid next visit.

## 2022-01-31 NOTE — Assessment & Plan Note (Signed)
Chronic, stable with no decline noted on recent nephrology labs.  Continue collaboration with nephrology.  No current ACE or ARB, may benefit from this in future if no past side effects with these OR consider addition of Farxiga for heart and kidney health.  CMP and CBC today.  Renal dose medications as needed.

## 2022-01-31 NOTE — Assessment & Plan Note (Signed)
Noted on on past labs intermittently.  Continue to monitor.  CBC today.

## 2022-01-31 NOTE — Assessment & Plan Note (Signed)
Chronic, ongoing.  Continue current medication regimen and adjust as needed.  Thyroid labs today.  ATA guidelines recommend goal for age <6.

## 2022-01-31 NOTE — Assessment & Plan Note (Signed)
Chronic, stable.  BP at goal at in office, assessed against her home BP cuff which was reading much higher -- recommend she obtain new BP cuff.  Followed by cardiology and nephrology.  Continue current medication regimen and adjust as needed.  Continue collaboration with cardiologist and nephrologist.  LABS: CBC & CMP.  Recommend she continue to monitor BP at home a few days a week and document + focus on DASH diet.  Return in 6 months.

## 2022-01-31 NOTE — Assessment & Plan Note (Signed)
Chronic, ongoing.  Continue current medication regimen and adjust as needed based on labs.  Lipid panel today and CMP.

## 2022-01-31 NOTE — Progress Notes (Signed)
BP 130/68 (BP Location: Left Arm, Patient Position: Sitting, Cuff Size: Normal)   Pulse 90   Temp 97.9 F (36.6 C) (Oral)   Ht 5' 1"  (1.549 m)   Wt 173 lb 14.4 oz (78.9 kg)   LMP  (LMP Unknown)   SpO2 99%   BMI 32.86 kg/m    Subjective:    Patient ID: Amy Myers, female    DOB: [birth year], 76 y.o.   MRN: [id number]  HPI: Amy Myers is a 76 y.o. female  Chief Complaint  Patient presents with   Hyperlipidemia   Hypertension    Patient says she has her new blood pressure monitor with her today to discuss with provider.   Gout   Osteopenia   Atrial Fibrillation   Chronic Kidney Disease   Vitamin B12   Vitamin D   Hashimoto's Thyroiditis   Medication Management    Patient says Dr.Kahn has stopped her blood pressure medication back in September. Patient says he stopped the medication due to patient's blood pressure readings being low.    HYPERTENSION / HYPERLIPIDEMIA  Followed by cardiology, Dr. Humphrey Rolls, and last saw in September per patient and BP medication (Amlodipine) were stopped due to low BP (notes not available in chart). Continues on Metoprolol, Rosuvastatin + Eliquis.  Last echo in 2023 per patient, not available in chart.  History of aortic atherosclerosis noted on past imaging. Satisfied with current treatment? yes Duration of hypertension: chronic BP monitoring frequency: occasionally BP range: <130/80 on average BP medication side effects: no Duration of hyperlipidemia: chronic Cholesterol medication side effects: no Cholesterol supplements: none Medication compliance: good compliance Aspirin: no Recent stressors: no Recurrent headaches: no Visual changes: no Palpitations: no Dyspnea: no Chest pain: no Lower extremity edema: no Dizzy/lightheaded: no   ATRIAL FIBRILLATION Atrial fibrillation status: stable Satisfied with current treatment: yes  Medication side effects:  no Medication compliance: good compliance Etiology of atrial  fibrillation: unknown Palpitations:  no Chest pain:  no Dyspnea on exertion:  no Orthopnea:  no Syncope:  no Edema:  no Ventricular rate control: B-blocker Anti-coagulation: long acting    CHRONIC KIDNEY DISEASE/ANEMIA/GOUT Saw nephrology in 10/30/21 with CRT 1.35 and GFR 40, remaining baseline. Is taking B12 and Vit D daily. Denies any anemia symptoms.     Underlying gout and takes Allopurinol, no recent flares.  Has not had a flare in many years. CKD status: stable Medications renally dose: yes Previous renal evaluation: yes Pneumovax:  Up to Date Influenza Vaccine:  Up to Date   ATRIAL FIBRILLATION Atrial fibrillation status: stable Satisfied with current treatment: yes  Medication side effects:  no Medication compliance: good compliance Etiology of atrial fibrillation: unknown Palpitations:  no Chest pain:  no Dyspnea on exertion:  no Orthopnea:  no Syncope:  no Edema:  no Ventricular rate control: B-blocker Anti-coagulation: long acting  NEUROPATHY She reports having issues with left hand over months, started after she fell 06/21/2018.  No further fells, but noticed the fingers in left hand go numb often/intermittent.  Tingle sensation.  Right hand is dominant.  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  Denies numbness down arm, only to left fingers, no facial drooping, slurred speech.   Location:  Pain: no Severity: mild  Quality:  tingling Frequency: intermittent Bilateral: no Symmetric: no Numbness: yes Decreased sensation: no Weakness: no Context: fluctuating Alleviating factors: warmth and moving hand Aggravating factors: none Treatments attempted: warmth or hand movements   HYPOTHYROIDISM Continues  on Levothyroxine 75 MCG.   Thyroid control status:stable Satisfied with current treatment? yes Medication side effects: no Medication compliance: good compliance Etiology of hypothyroidism:  Recent dose adjustment:no Fatigue: no Cold  intolerance: no Heat intolerance: no Weight gain: no Weight loss: no Constipation: sometimes Diarrhea/loose stools: no Palpitations: no Lower extremity edema: no Anxiety/depressed mood: no    OSTEOPENIA Ongoing on DEXA 02/15/21 with T-score -1.7.  Continues on Vitamin D and B12 at home. Satisfied with current treatment?: yes Adequate calcium & vitamin D: yes Intolerance to bisphosphonates: Evista on holiday Weight bearing exercises: yes  Relevant past medical, surgical, family and social history reviewed and updated as indicated. Interim medical history since our last visit reviewed. Allergies and medications reviewed and updated.  Review of Systems  Constitutional:  Negative for activity change, appetite change, diaphoresis, fatigue and unexpected weight change.  Respiratory: Negative.    Cardiovascular: Negative.   Gastrointestinal: Negative.   Endocrine: Negative for cold intolerance and heat intolerance.  Neurological:  Positive for numbness (to fingers left hand only). Negative for dizziness, seizures, syncope, facial asymmetry, speech difficulty, weakness, light-headedness and headaches.  Psychiatric/Behavioral: Negative.      Per HPI unless specifically indicated above     Objective:    BP 130/68 (BP Location: Left Arm, Patient Position: Sitting, Cuff Size: Normal)   Pulse 90   Temp 97.9 F (36.6 C) (Oral)   Ht 5' 1"  (1.549 m)   Wt 173 lb 14.4 oz (78.9 kg)   LMP  (LMP Unknown)   SpO2 99%   BMI 32.86 kg/m   Wt Readings from Last 3 Encounters:  01/31/22 173 lb 14.4 oz (78.9 kg)  08/04/21 171 lb 12.8 oz (77.9 kg)  02/03/21 177 lb (80.3 kg)    Physical Exam Vitals and nursing note reviewed.  Constitutional:      General: She is awake. She is not in acute distress.    Appearance: She is well-developed and well-groomed. She is obese. She is not ill-appearing.  HENT:     Head: Normocephalic.     Right Ear: Hearing normal.     Left Ear: Hearing normal.  Eyes:      General: Lids are normal.        Right eye: No discharge.        Left eye: No discharge.     Conjunctiva/sclera: Conjunctivae normal.     Pupils: Pupils are equal, round, and reactive to light.  Neck:     Thyroid: No thyromegaly.     Vascular: No carotid bruit.  Cardiovascular:     Rate and Rhythm: Normal rate. Rhythm irregularly irregular.     Heart sounds: Murmur heard.     Systolic murmur is present with a grade of 2/6.     No gallop.  Pulmonary:     Effort: Pulmonary effort is normal. No accessory muscle usage or respiratory distress.     Breath sounds: Normal breath sounds.  Abdominal:     General: Bowel sounds are normal.     Palpations: Abdomen is soft.  Musculoskeletal:     Cervical back: Normal range of motion and neck supple.     Right lower leg: No edema.     Left lower leg: No edema.  Skin:    General: Skin is warm and dry.  Neurological:     Mental Status: She is alert and oriented to person, place, and time.  Psychiatric:        Attention and Perception: Attention normal.  Mood and Affect: Mood normal.        Behavior: Behavior normal. Behavior is cooperative.        Thought Content: Thought content normal.        Judgment: Judgment normal.    Results for orders placed or performed in visit on 08/04/21  Comprehensive metabolic panel  Result Value Ref Range   Glucose 103 (H) 70 - 99 mg/dL   BUN 16 8 - 27 mg/dL   Creatinine, Ser 1.23 (H) 0.57 - 1.00 mg/dL   eGFR 46 (L) >59 mL/min/1.73   BUN/Creatinine Ratio 13 12 - 28   Sodium 140 134 - 144 mmol/L   Potassium 4.2 3.5 - 5.2 mmol/L   Chloride 102 96 - 106 mmol/L   CO2 25 20 - 29 mmol/L   Calcium 9.6 8.7 - 10.3 mg/dL   Total Protein 6.3 6.0 - 8.5 g/dL   Albumin 4.5 3.7 - 4.7 g/dL   Globulin, Total 1.8 1.5 - 4.5 g/dL   Albumin/Globulin Ratio 2.5 (H) 1.2 - 2.2   Bilirubin Total 0.5 0.0 - 1.2 mg/dL   Alkaline Phosphatase 92 44 - 121 IU/L   AST 19 0 - 40 IU/L   ALT 16 0 - 32 IU/L  Lipid Panel  w/o Chol/HDL Ratio  Result Value Ref Range   Cholesterol, Total 138 100 - 199 mg/dL   Triglycerides 59 0 - 149 mg/dL   HDL 83 >39 mg/dL   VLDL Cholesterol Cal 13 5 - 40 mg/dL   LDL Chol Calc (NIH) 42 0 - 99 mg/dL  CBC with Differential/Platelet  Result Value Ref Range   WBC 5.0 3.4 - 10.8 x10E3/uL   RBC 4.13 3.77 - 5.28 x10E6/uL   Hemoglobin 12.5 11.1 - 15.9 g/dL   Hematocrit 38.2 34.0 - 46.6 %   MCV 93 79 - 97 fL   MCH 30.3 26.6 - 33.0 pg   MCHC 32.7 31.5 - 35.7 g/dL   RDW 14.1 11.7 - 15.4 %   Platelets 129 (L) 150 - 450 x10E3/uL   Neutrophils 63 Not Estab. %   Lymphs 27 Not Estab. %   Monocytes 6 Not Estab. %   Eos 3 Not Estab. %   Basos 1 Not Estab. %   Neutrophils Absolute 3.1 1.4 - 7.0 x10E3/uL   Lymphocytes Absolute 1.4 0.7 - 3.1 x10E3/uL   Monocytes Absolute 0.3 0.1 - 0.9 x10E3/uL   EOS (ABSOLUTE) 0.2 0.0 - 0.4 x10E3/uL   Basophils Absolute 0.1 0.0 - 0.2 x10E3/uL   Immature Granulocytes 0 Not Estab. %   Immature Grans (Abs) 0.0 0.0 - 0.1 x10E3/uL  Uric acid  Result Value Ref Range   Uric Acid 3.4 3.1 - 7.9 mg/dL      Assessment & Plan:   Problem List Items Addressed This Visit       Cardiovascular and Mediastinum   Aortic atherosclerosis (HCC)    Chronic.  Noted on CT imaging 10/04/17.  Educated patient, continue statin and Eliquis daily.  Recommend focus on healthy diet and regular activity.      Relevant Orders   Comprehensive metabolic panel   Lipid Panel w/o Chol/HDL Ratio   Chronic atrial fibrillation (HCC)    Chronic, rate controlled.  Continue current medication regimen and collaboration with cardiology (Dr. Humphrey Rolls).  Attempt to get updated notes.      Relevant Orders   Comprehensive metabolic panel   Lipid Panel w/o Chol/HDL Ratio   Hypertension    Chronic, stable.  BP  at goal at in office, assessed against her home BP cuff which was reading much higher -- recommend she obtain new BP cuff.  Followed by cardiology and nephrology.  Continue current  medication regimen and adjust as needed.  Continue collaboration with cardiologist and nephrologist.  LABS: CBC & CMP.  Recommend she continue to monitor BP at home a few days a week and document + focus on DASH diet.  Return in 6 months.      Relevant Orders   Microalbumin, Urine Waived   CBC with Differential/Platelet   Comprehensive metabolic panel   Mitral valve regurgitation    Noted on past echo, mild to moderate.  No symptoms at this time.  Continue current medication regimen and collaboration with cardiology.      Relevant Orders   Comprehensive metabolic panel   Lipid Panel w/o Chol/HDL Ratio     Endocrine   Hashimoto's thyroiditis    Chronic, ongoing.  Continue current medication regimen and adjust as needed.  Thyroid labs today.  ATA guidelines recommend goal for age <6.        Relevant Medications   levothyroxine (SYNTHROID) 75 MCG tablet   Other Relevant Orders   TSH   T4, free     Musculoskeletal and Integument   Osteopenia of neck of left femur    Chronic, ongoing.  Noted on 2017 DEXA and repeat in November 2022 (T-score -1.7).  Continue supplements at home and gentle weight bearing exercises.  Check Vit D next visit, recent was stable. Continue Evista holiday.  Plan on recheck DEXA in November 2027.      Relevant Orders   VITAMIN D 25 Hydroxy (Vit-D Deficiency, Fractures)     Genitourinary   Stage 3 chronic kidney disease (HCC) - Primary    Chronic, stable with no decline noted on recent nephrology labs.  Continue collaboration with nephrology.  No current ACE or ARB, may benefit from this in future if no past side effects with these OR consider addition of Farxiga for heart and kidney health.  CMP and CBC today.  Renal dose medications as needed.      Relevant Orders   Microalbumin, Urine Waived   Comprehensive metabolic panel     Hematopoietic and Hemostatic   Other thrombophilia ([place])    Continues on Eliquis for atrial fibrillation.  Recent CBC with  nephrology remains stable.  She is to notify provider if any increased bruising or skin breakdown.      Relevant Orders   CBC with Differential/Platelet   Thrombocytopenia ([place])    Noted on on past labs intermittently.  Continue to monitor.  CBC today.      Relevant Orders   CBC with Differential/Platelet     Other   Gout    Chronic, stable with no recent flares.  Continue Allopurinol, but reduce to renal dosing 100 MG, and check uric acid next visit.      Hyperlipidemia    Chronic, ongoing.  Continue current medication regimen and adjust as needed based on labs.  Lipid panel today and CMP.      Relevant Orders   Comprehensive metabolic panel   Lipid Panel w/o Chol/HDL Ratio   Long term (current) use of anticoagulants    Chronic, ongoing with a-fib.  Continue current medication regimen and check CBC.  Continue collaboration with cardiology.      Relevant Orders   CBC with Differential/Platelet   Numbness of fingers    To left hand only -- no  red flags on exam today. Only to fingers.  Started after fall 3 years ago, suspect some OA and possible nerve issues.  Will obtain imaging of hand to further assess.  Recommend she trial Magnesium 400 MG at night.  Continue B12 daily.  Plan on return in 6 weeks.  Discussed red flag symptoms to be seen immediately for.      Relevant Orders   DG Hand Complete Left   Obesity    BMI 32.86 with a-fib and HTN + CKD.  Recommended eating smaller high protein, low fat meals more frequently and exercising 30 mins a day 5 times a week with a goal of 10-15lb weight loss in the next 3 months. Patient voiced their understanding and motivation to adhere to these recommendations.        Vitamin B12 deficiency    Currently taking daily supplement. Will check B12 level and adjust regimen as needed.       Relevant Orders   Vitamin B12   Vitamin D deficiency    Will check vitamin D level today and adjust regimen as needed.       Relevant Orders    VITAMIN D 25 Hydroxy (Vit-D Deficiency, Fractures)   Other Visit Diagnoses     Flu vaccine need       Flu vaccine in office today.   Relevant Orders   Flu Vaccine QUAD High Dose(Fluad) (Completed)        Follow up plan: Return in about 6 weeks (around 03/14/2022) for Numbness left fingers.

## 2022-01-31 NOTE — Assessment & Plan Note (Signed)
BMI 32.86 with a-fib and HTN + CKD.  Recommended eating smaller high protein, low fat meals more frequently and exercising 30 mins a day 5 times a week with a goal of 10-15lb weight loss in the next 3 months. Patient voiced their understanding and motivation to adhere to these recommendations.

## 2022-01-31 NOTE — Assessment & Plan Note (Signed)
Chronic, ongoing with a-fib.  Continue current medication regimen and check CBC.  Continue collaboration with cardiology.

## 2022-01-31 NOTE — Assessment & Plan Note (Signed)
Continues on Eliquis for atrial fibrillation.  Recent CBC with nephrology remains stable.  She is to notify provider if any increased bruising or skin breakdown.

## 2022-01-31 NOTE — Assessment & Plan Note (Signed)
Chronic, ongoing.  Noted on 2017 DEXA and repeat in November 2022 (T-score -1.7).  Continue supplements at home and gentle weight bearing exercises.  Check Vit D next visit, recent was stable. Continue Evista holiday.  Plan on recheck DEXA in November 2027.

## 2022-01-31 NOTE — Assessment & Plan Note (Signed)
Will check vitamin D level today and adjust regimen as needed.

## 2022-01-31 NOTE — Assessment & Plan Note (Signed)
Chronic, rate controlled.  Continue current medication regimen and collaboration with cardiology (Dr. Humphrey Rolls).  Attempt to get updated notes.

## 2022-01-31 NOTE — Assessment & Plan Note (Signed)
Chronic.  Noted on CT imaging 10/04/17.  Educated patient, continue statin and Eliquis daily.  Recommend focus on healthy diet and regular activity.

## 2022-01-31 NOTE — Assessment & Plan Note (Signed)
To left hand only -- no red flags on exam today. Only to fingers.  Started after fall 3 years ago, suspect some OA and possible nerve issues.  Will obtain imaging of hand to further assess.  Recommend she trial Magnesium 400 MG at night.  Continue B12 daily.  Plan on return in 6 weeks.  Discussed red flag symptoms to be seen immediately for.

## 2022-01-31 NOTE — Assessment & Plan Note (Signed)
Noted on past echo, mild to moderate.  No symptoms at this time.  Continue current medication regimen and collaboration with cardiology.

## 2022-02-01 LAB — CBC WITH DIFFERENTIAL/PLATELET
Basophils Absolute: 0.1 10*3/uL (ref 0.0–0.2)
Basos: 1 %
EOS (ABSOLUTE): 0.2 10*3/uL (ref 0.0–0.4)
Eos: 3 %
Hematocrit: 41.2 % (ref 34.0–46.6)
Hemoglobin: 13.1 g/dL (ref 11.1–15.9)
Immature Grans (Abs): 0 10*3/uL (ref 0.0–0.1)
Immature Granulocytes: 0 %
Lymphocytes Absolute: 1.7 10*3/uL (ref 0.7–3.1)
Lymphs: 30 %
MCH: 29.2 pg (ref 26.6–33.0)
MCHC: 31.8 g/dL (ref 31.5–35.7)
MCV: 92 fL (ref 79–97)
Monocytes Absolute: 0.4 10*3/uL (ref 0.1–0.9)
Monocytes: 6 %
Neutrophils Absolute: 3.4 10*3/uL (ref 1.4–7.0)
Neutrophils: 60 %
Platelets: 142 10*3/uL — ABNORMAL LOW (ref 150–450)
RBC: 4.48 x10E6/uL (ref 3.77–5.28)
RDW: 13.5 % (ref 11.7–15.4)
WBC: 5.8 10*3/uL (ref 3.4–10.8)

## 2022-02-01 LAB — COMPREHENSIVE METABOLIC PANEL
ALT: 15 IU/L (ref 0–32)
AST: 20 IU/L (ref 0–40)
Albumin/Globulin Ratio: 2.4 — ABNORMAL HIGH (ref 1.2–2.2)
Albumin: 4.6 g/dL (ref 3.8–4.8)
Alkaline Phosphatase: 92 IU/L (ref 44–121)
BUN/Creatinine Ratio: 12 (ref 12–28)
BUN: 15 mg/dL (ref 8–27)
Bilirubin Total: 0.6 mg/dL (ref 0.0–1.2)
CO2: 24 mmol/L (ref 20–29)
Calcium: 9.3 mg/dL (ref 8.7–10.3)
Chloride: 100 mmol/L (ref 96–106)
Creatinine, Ser: 1.28 mg/dL — ABNORMAL HIGH (ref 0.57–1.00)
Globulin, Total: 1.9 g/dL (ref 1.5–4.5)
Glucose: 95 mg/dL (ref 70–99)
Potassium: 4.1 mmol/L (ref 3.5–5.2)
Sodium: 138 mmol/L (ref 134–144)
Total Protein: 6.5 g/dL (ref 6.0–8.5)
eGFR: 43 mL/min/{1.73_m2} — ABNORMAL LOW (ref >59)

## 2022-02-01 LAB — LIPID PANEL W/O CHOL/HDL RATIO
Cholesterol, Total: 150 mg/dL (ref 100–199)
HDL: 95 mg/dL (ref >39)
LDL Chol Calc (NIH): 41 mg/dL (ref 0–99)
Triglycerides: 71 mg/dL (ref 0–149)
VLDL Cholesterol Cal: 14 mg/dL (ref 5–40)

## 2022-02-01 LAB — T4, FREE: Free T4: 1.68 ng/dL (ref 0.82–1.77)

## 2022-02-01 LAB — VITAMIN D 25 HYDROXY (VIT D DEFICIENCY, FRACTURES): Vit D, 25-Hydroxy: 39.5 ng/mL (ref 30.0–100.0)

## 2022-02-01 LAB — VITAMIN B12: Vitamin B-12: 488 pg/mL (ref 232–1245)

## 2022-02-01 LAB — TSH: TSH: 1.66 u[IU]/mL (ref 0.450–4.500)

## 2022-02-01 NOTE — Progress Notes (Signed)
Good morning, I hear Amy Myers is out on the 20th.  Please let Jacqlyn Larsen know her labs have returned and overall remain stable, kidney function remains at her baseline kidney disease.  Platelets are a little low, baseline at this time and we will continue to monitor.  Any questions?  Continue all current medications, there are no changes needed. Keep being amazing!!  Thank you for allowing me to participate in your care.  I appreciate you. Kindest regards, Jadene Stemmer

## 2022-02-08 DIAGNOSIS — D485 Neoplasm of uncertain behavior of skin: Secondary | ICD-10-CM | POA: Diagnosis not present

## 2022-02-08 DIAGNOSIS — L578 Other skin changes due to chronic exposure to nonionizing radiation: Secondary | ICD-10-CM | POA: Diagnosis not present

## 2022-02-08 DIAGNOSIS — C44319 Basal cell carcinoma of skin of other parts of face: Secondary | ICD-10-CM | POA: Diagnosis not present

## 2022-02-08 DIAGNOSIS — Z85828 Personal history of other malignant neoplasm of skin: Secondary | ICD-10-CM | POA: Diagnosis not present

## 2022-02-19 ENCOUNTER — Ambulatory Visit
Admission: RE | Admit: 2022-02-19 | Discharge: 2022-02-19 | Disposition: A | Discharge status: Home / Self Care | Payer: Medicare Other | Source: Ambulatory Visit | Attending: Nurse Practitioner | Admitting: Nurse Practitioner

## 2022-02-19 ENCOUNTER — Ambulatory Visit
Admission: RE | Admit: 2022-02-19 | Discharge: 2022-02-19 | Disposition: A | Discharge status: Home / Self Care | Payer: Medicare Other | Attending: Nurse Practitioner | Admitting: Nurse Practitioner

## 2022-02-19 DIAGNOSIS — R200 Anesthesia of skin: Secondary | ICD-10-CM | POA: Diagnosis not present

## 2022-02-21 ENCOUNTER — Other Ambulatory Visit: Payer: Self-pay | Admitting: Nurse Practitioner

## 2022-02-21 NOTE — Telephone Encounter (Signed)
Requested Prescriptions  Pending Prescriptions Disp Refills   levothyroxine (SYNTHROID) 75 MCG tablet [Pharmacy Med Name: LEVOTHYROXINE 75 MCG TABLET] 90 tablet 0    Sig: Take 1 tablet (75 mcg total) by mouth daily.     Endocrinology:  Hypothyroid Agents Passed - 02/21/2022  1:34 PM      Passed - TSH in normal range and within 360 days    TSH  Date Value Ref Range Status  01/31/2022 1.660 0.450 - 4.500 uIU/mL Final         Passed - Valid encounter within last 12 months    Recent Outpatient Visits           3 weeks ago Stage 3b chronic kidney disease ([place])   [place] Cannady, Jolene T, NP   6 months ago Chronic atrial fibrillation ([place])   [place], Henrine Screws T, NP   1 year ago Chronic atrial fibrillation ([place])   [place], Lauren A, NP   1 year ago Chronic atrial fibrillation ([place])   [place], [place] T, NP   2 years ago Chronic atrial fibrillation ([place])   [place], Barbaraann Faster, NP       Future Appointments             In 3 weeks Cannady, Barbaraann Faster, NP [facility]

## 2022-02-21 NOTE — Progress Notes (Signed)
Good morning, please let Amy Myers know her imaging shows mild degenerative changes (arthritis) but no fractures or other acute findings.  If ongoing pain would recommend a referral to ortho.  Any questions?

## 2022-03-11 NOTE — Patient Instructions (Signed)
Neuropathic Pain Neuropathic pain is pain caused by damage to the nerves that are responsible for certain sensations in your body (sensory nerves). Neuropathic pain can make you more sensitive to pain. Even a minor sensation can feel very painful. This is usually a long-term (chronic) condition that can be difficult to treat. The type of pain differs from person to person. It may: Start suddenly (acute), or it may develop slowly and become chronic. Come and go as damaged nerves heal, or it may stay at the same level for years. Cause emotional distress, loss of sleep, and a lower quality of life. What are the causes? The most common cause of this condition is diabetes. Many other diseases and conditions can also cause neuropathic pain. Causes of neuropathic pain can be classified as: Toxic. This is caused by medicines and chemicals. The most common causes of toxic neuropathic pain is damage from medicines that kill cancer cells (chemotherapy) or alcohol abuse. Metabolic. This can be caused by: Diabetes. Lack of vitamins like B12. Traumatic. Any injury that cuts, crushes, or stretches a nerve can cause damage and pain. Compression-related. If a sensory nerve gets trapped or compressed for a long period of time, the blood supply to the nerve can be cut off. Vascular. Many blood vessel diseases can cause neuropathic pain by decreasing blood supply and oxygen to nerves. Autoimmune. This type of pain results from diseases in which the body's defense system (immune system) mistakenly attacks sensory nerves. Examples of autoimmune diseases that can cause neuropathic pain include lupus and multiple sclerosis. Infectious. Many types of viral infections can damage sensory nerves and cause pain. Shingles infection is a common cause of this type of pain. Inherited. Neuropathic pain can be a symptom of many diseases that are passed down through families (genetic). What increases the risk? You are more likely to  develop this condition if: You have diabetes. You smoke. You drink too much alcohol. You are taking certain medicines, including chemotherapy or medicines that treat immune system disorders. What are the signs or symptoms? The main symptom is pain. Neuropathic pain is often described as: Burning. Shock-like. Stinging. Hot or cold. Itching. How is this diagnosed? No single test can diagnose neuropathic pain. It is diagnosed based on: A physical exam and your symptoms. Your health care provider will ask you about your pain. You may be asked to use a pain scale to describe how bad your pain is. Tests. These may be done to see if you have a cause and location of any nerve damage. They include: Nerve conduction studies and electromyography to test how well nerve signals travel through your nerves and muscles (electrodiagnostic testing). Skin biopsy to evaluate for small fiber neuropathy. Imaging studies, such as: X-rays. CT scan. MRI. How is this treated? Treatment for neuropathic pain may change over time. You may need to try different treatment options or a combination of treatments. Some options include: Treating the underlying cause of the neuropathy, such as diabetes, kidney disease, or vitamin deficiencies. Stopping medicines that can cause neuropathy, such as chemotherapy. Medicine to relieve pain. Medicines may include: Prescription or over-the-counter pain medicine. Anti-seizure medicine. Antidepressant medicines. Pain-relieving patches or creams that are applied to painful areas of skin. A medicine to numb the area (local anesthetic), which can be injected as a nerve block. Transcutaneous nerve stimulation. This uses electrical currents to block painful nerve signals. The treatment is painless. Alternative treatments, such as: Acupuncture. Meditation. Massage. Occupational or physical therapy. Pain management programs. Counseling. Follow   these instructions at  home: Medicines  Take over-the-counter and prescription medicines only as told by your health care provider. Ask your health care provider if the medicine prescribed to you: Requires you to avoid driving or using machinery. Can cause constipation. You may need to take these actions to prevent or treat constipation: Drink enough fluid to keep your urine pale yellow. Take over-the-counter or prescription medicines. Eat foods that are high in fiber, such as beans, whole grains, and fresh fruits and vegetables. Limit foods that are high in fat and processed sugars, such as fried or sweet foods. Lifestyle  Have a good support system at home. Consider joining a chronic pain support group. Do not use any products that contain nicotine or tobacco. These products include cigarettes, chewing tobacco, and vaping devices, such as e-cigarettes. If you need help quitting, ask your health care provider. Do not drink alcohol. General instructions Learn as much as you can about your condition. Work closely with all your health care providers to find the treatment plan that works best for you. Ask your health care provider what activities are safe for you. Keep all follow-up visits. This is important. Contact a health care provider if: Your pain treatments are not working. You are having side effects from your medicines. You are struggling with tiredness (fatigue), mood changes, depression, or anxiety. Get help right away if: You have thoughts of hurting yourself. Get help right away if you feel like you may hurt yourself or others, or have thoughts about taking your own life. Go to your nearest emergency room or: Call 911. Call the National Suicide Prevention Lifeline at 1-800-273-8255 or 988. This is open 24 hours a day. Text the Crisis Text Line at 741741. Summary Neuropathic pain is pain caused by damage to the nerves that are responsible for certain sensations in your body (sensory  nerves). Neuropathic pain may come and go as damaged nerves heal, or it may stay at the same level for years. Neuropathic pain is usually a long-term condition that can be difficult to treat. Consider joining a chronic pain support group. This information is not intended to replace advice given to you by your health care provider. Make sure you discuss any questions you have with your health care provider. Document Revised: 11/28/2020 Document Reviewed: 11/28/2020 Elsevier Patient Education  2023 Elsevier Inc.  

## 2022-03-14 ENCOUNTER — Ambulatory Visit (INDEPENDENT_AMBULATORY_CARE_PROVIDER_SITE_OTHER): Payer: Medicare Other | Admitting: Nurse Practitioner

## 2022-03-14 ENCOUNTER — Encounter: Payer: Self-pay | Admitting: Nurse Practitioner

## 2022-03-14 VITALS — BP 132/80 | HR 87 | Temp 97.8°F | Ht 60.98 in | Wt 174.7 lb

## 2022-03-14 DIAGNOSIS — R200 Anesthesia of skin: Secondary | ICD-10-CM

## 2022-03-14 NOTE — Progress Notes (Addendum)
BP 132/80 (BP Location: Left Arm, Patient Position: Sitting, Cuff Size: Normal)   Pulse 87   Temp 97.8 F (36.6 C) (Oral)   Ht 5' 0.98" (1.549 m)   Wt 174 lb 11.2 oz (79.2 kg)   LMP  (LMP Unknown)   SpO2 99%   BMI 33.03 kg/m    Subjective:    Patient ID: Amy Myers, female    DOB: 07/23/1945, 76 y.o.   MRN: [id number]  HPI: Amy Myers is a 76 y.o. female  Chief Complaint  Patient presents with   Numbness    Left hand(Fingers) Patient states that it seems to be getting better   NEUROPATHY Follow-up for left finger numbness.  Started after she fell 06/21/2018 (over 3 years ago).  She started Magnesium supplement at night.  No further falls, but noticed the fingers in left hand go numb often/intermittent -- all fingers.  Tingle sensation.  Right hand is dominant.  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  Denies numbness down arm, only to left fingers, no facial drooping, slurred speech.    Imaging on 02/19/22 this noted mild degenerative changes of hand Location:  Pain: no Severity: mild  Quality:  tingling Frequency: intermittent Bilateral: no Symmetric: no Numbness: yes Decreased sensation: no Weakness: sometimes Context: some improvement present Alleviating factors: Magnesium, heat, and moving hand Aggravating factors: none -- notices it most when lies down in bed, will put sock on hand to keep warm and this helps Treatments attempted: as above  Relevant past medical, surgical, family and social history reviewed and updated as indicated. Interim medical history since our last visit reviewed. Allergies and medications reviewed and updated.  Review of Systems  Constitutional:  Negative for activity change, appetite change, diaphoresis, fatigue and unexpected weight change.  Respiratory: Negative.    Cardiovascular: Negative.   Gastrointestinal: Negative.   Endocrine: Negative for cold intolerance and heat intolerance.  Neurological:  Positive  for numbness (to fingers left hand only). Negative for dizziness, seizures, syncope, facial asymmetry, speech difficulty, weakness, light-headedness and headaches.  Psychiatric/Behavioral: Negative.     Per HPI unless specifically indicated above     Objective:    BP 132/80 (BP Location: Left Arm, Patient Position: Sitting, Cuff Size: Normal)   Pulse 87   Temp 97.8 F (36.6 C) (Oral)   Ht 5' 0.98" (1.549 m)   Wt 174 lb 11.2 oz (79.2 kg)   LMP  (LMP Unknown)   SpO2 99%   BMI 33.03 kg/m   Wt Readings from Last 3 Encounters:  03/14/22 174 lb 11.2 oz (79.2 kg)  01/31/22 173 lb 14.4 oz (78.9 kg)  08/04/21 171 lb 12.8 oz (77.9 kg)    Physical Exam Vitals and nursing note reviewed.  Constitutional:      General: She is awake. She is not in acute distress.    Appearance: She is well-developed and well-groomed. She is obese. She is not ill-appearing.  HENT:     Head: Normocephalic.     Right Ear: Hearing normal.     Left Ear: Hearing normal.  Eyes:     General: Lids are normal.        Right eye: No discharge.        Left eye: No discharge.     Conjunctiva/sclera: Conjunctivae normal.     Pupils: Pupils are equal, round, and reactive to light.  Neck:     Thyroid: No thyromegaly.     Vascular: No  carotid bruit.  Cardiovascular:     Rate and Rhythm: Normal rate. Rhythm irregularly irregular.     Heart sounds: Murmur heard.     Systolic murmur is present with a grade of 2/6.     No gallop.  Pulmonary:     Effort: Pulmonary effort is normal. No accessory muscle usage or respiratory distress.     Breath sounds: Normal breath sounds.  Abdominal:     General: Bowel sounds are normal.     Palpations: Abdomen is soft.  Musculoskeletal:     Cervical back: Normal range of motion and neck supple.     Right lower leg: No edema.     Left lower leg: No edema.  Skin:    General: Skin is warm and dry.  Neurological:     Mental Status: She is alert and oriented to person, place, and  time.  Psychiatric:        Attention and Perception: Attention normal.        Mood and Affect: Mood normal.        Behavior: Behavior normal. Behavior is cooperative.        Thought Content: Thought content normal.        Judgment: Judgment normal.    Results for orders placed or performed in visit on 01/31/22  Microalbumin, Urine Waived  Result Value Ref Range   Microalb, Ur Waived 30 (H) 0 - 19 mg/L   Creatinine, Urine Waived 50 10 - 300 mg/dL   Microalb/Creat Ratio 30-300 (H) <30 mg/g  CBC with Differential/Platelet  Result Value Ref Range   WBC 5.8 3.4 - 10.8 x10E3/uL   RBC 4.48 3.77 - 5.28 x10E6/uL   Hemoglobin 13.1 11.1 - 15.9 g/dL   Hematocrit 41.2 34.0 - 46.6 %   MCV 92 79 - 97 fL   MCH 29.2 26.6 - 33.0 pg   MCHC 31.8 31.5 - 35.7 g/dL   RDW 13.5 11.7 - 15.4 %   Platelets 142 (L) 150 - 450 x10E3/uL   Neutrophils 60 Not Estab. %   Lymphs 30 Not Estab. %   Monocytes 6 Not Estab. %   Eos 3 Not Estab. %   Basos 1 Not Estab. %   Neutrophils Absolute 3.4 1.4 - 7.0 x10E3/uL   Lymphocytes Absolute 1.7 0.7 - 3.1 x10E3/uL   Monocytes Absolute 0.4 0.1 - 0.9 x10E3/uL   EOS (ABSOLUTE) 0.2 0.0 - 0.4 x10E3/uL   Basophils Absolute 0.1 0.0 - 0.2 x10E3/uL   Immature Granulocytes 0 Not Estab. %   Immature Grans (Abs) 0.0 0.0 - 0.1 x10E3/uL  Comprehensive metabolic panel  Result Value Ref Range   Glucose 95 70 - 99 mg/dL   BUN 15 8 - 27 mg/dL   Creatinine, Ser 1.28 (H) 0.57 - 1.00 mg/dL   eGFR 43 (L) >59 mL/min/1.73   BUN/Creatinine Ratio 12 12 - 28   Sodium 138 134 - 144 mmol/L   Potassium 4.1 3.5 - 5.2 mmol/L   Chloride 100 96 - 106 mmol/L   CO2 24 20 - 29 mmol/L   Calcium 9.3 8.7 - 10.3 mg/dL   Total Protein 6.5 6.0 - 8.5 g/dL   Albumin 4.6 3.8 - 4.8 g/dL   Globulin, Total 1.9 1.5 - 4.5 g/dL   Albumin/Globulin Ratio 2.4 (H) 1.2 - 2.2   Bilirubin Total 0.6 0.0 - 1.2 mg/dL   Alkaline Phosphatase 92 44 - 121 IU/L   AST 20 0 - 40 IU/L   ALT 15 0 -  32 IU/L  Lipid Panel w/o  Chol/HDL Ratio  Result Value Ref Range   Cholesterol, Total 150 100 - 199 mg/dL   Triglycerides 71 0 - 149 mg/dL   HDL 95 >39 mg/dL   VLDL Cholesterol Cal 14 5 - 40 mg/dL   LDL Chol Calc (NIH) 41 0 - 99 mg/dL  TSH  Result Value Ref Range   TSH 1.660 0.450 - 4.500 uIU/mL  T4, free  Result Value Ref Range   Free T4 1.68 0.82 - 1.77 ng/dL  VITAMIN D 25 Hydroxy (Vit-D Deficiency, Fractures)  Result Value Ref Range   Vit D, 25-Hydroxy 39.5 30.0 - 100.0 ng/mL  Vitamin B12  Result Value Ref Range   Vitamin B-12 488 232 - 1,245 pg/mL      Assessment & Plan:   Problem List Items Addressed This Visit       Other   Numbness of fingers - Primary    To left hand only -- no red flags on exam -- some improvement with adding on magnesium supplement. Only to fingers. 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 Recommend she continue Magnesium 400 MG at night + Tylenol as needed.  Continue B12 daily.  Plan on return in 5 months.  Discussed red flag symptoms to be seen immediately for.  Declines physical therapy -- provided her with stretches to do at home.        Follow up plan: Return in about 5 months (around 08/13/2022) for HYPOTHYROID, HTN/HLD, CKD, A-FIB, OSTEOPENIA, B12/Vit D.

## 2022-03-14 NOTE — Assessment & Plan Note (Addendum)
To left hand only -- no red flags on exam -- some improvement with adding on magnesium supplement. Only to fingers. 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 Recommend she continue Magnesium 400 MG at night + Tylenol as needed.  Continue B12 daily.  Plan on return in 5 months.  Discussed red flag symptoms to be seen immediately for.  Declines physical therapy -- provided her with stretches to do at home.

## 2022-03-28 DIAGNOSIS — C4491 Basal cell carcinoma of skin, unspecified: Secondary | ICD-10-CM | POA: Diagnosis not present

## 2022-03-28 DIAGNOSIS — L988 Other specified disorders of the skin and subcutaneous tissue: Secondary | ICD-10-CM | POA: Diagnosis not present

## 2022-04-24 DIAGNOSIS — I2510 Atherosclerotic heart disease of native coronary artery without angina pectoris: Secondary | ICD-10-CM | POA: Diagnosis not present

## 2022-04-24 DIAGNOSIS — I4891 Unspecified atrial fibrillation: Secondary | ICD-10-CM | POA: Diagnosis not present

## 2022-04-24 DIAGNOSIS — I10 Essential (primary) hypertension: Secondary | ICD-10-CM | POA: Diagnosis not present

## 2022-04-24 DIAGNOSIS — I340 Nonrheumatic mitral (valve) insufficiency: Secondary | ICD-10-CM | POA: Diagnosis not present

## 2022-04-24 DIAGNOSIS — E782 Mixed hyperlipidemia: Secondary | ICD-10-CM | POA: Diagnosis not present

## 2022-04-24 DIAGNOSIS — G4730 Sleep apnea, unspecified: Secondary | ICD-10-CM | POA: Diagnosis not present

## 2022-04-30 DIAGNOSIS — N1832 Chronic kidney disease, stage 3b: Secondary | ICD-10-CM | POA: Diagnosis not present

## 2022-04-30 DIAGNOSIS — I129 Hypertensive chronic kidney disease with stage 1 through stage 4 chronic kidney disease, or unspecified chronic kidney disease: Secondary | ICD-10-CM | POA: Diagnosis not present

## 2022-05-02 DIAGNOSIS — H25013 Cortical age-related cataract, bilateral: Secondary | ICD-10-CM | POA: Diagnosis not present

## 2022-05-02 DIAGNOSIS — H2513 Age-related nuclear cataract, bilateral: Secondary | ICD-10-CM | POA: Diagnosis not present

## 2022-05-02 DIAGNOSIS — H40023 Open angle with borderline findings, high risk, bilateral: Secondary | ICD-10-CM | POA: Diagnosis not present

## 2022-05-02 DIAGNOSIS — H35363 Drusen (degenerative) of macula, bilateral: Secondary | ICD-10-CM | POA: Diagnosis not present

## 2022-05-02 DIAGNOSIS — H5340 Unspecified visual field defects: Secondary | ICD-10-CM | POA: Diagnosis not present

## 2022-05-08 DIAGNOSIS — I129 Hypertensive chronic kidney disease with stage 1 through stage 4 chronic kidney disease, or unspecified chronic kidney disease: Secondary | ICD-10-CM | POA: Diagnosis not present

## 2022-05-08 DIAGNOSIS — N1832 Chronic kidney disease, stage 3b: Secondary | ICD-10-CM | POA: Diagnosis not present

## 2022-05-08 DIAGNOSIS — I10 Essential (primary) hypertension: Secondary | ICD-10-CM | POA: Diagnosis not present

## 2022-06-18 ENCOUNTER — Other Ambulatory Visit: Payer: Self-pay | Admitting: Nurse Practitioner

## 2022-06-18 ENCOUNTER — Other Ambulatory Visit: Payer: Self-pay | Admitting: Cardiovascular Disease

## 2022-06-18 DIAGNOSIS — I482 Chronic atrial fibrillation, unspecified: Secondary | ICD-10-CM

## 2022-06-18 NOTE — Telephone Encounter (Signed)
Requested Prescriptions  Pending Prescriptions Disp Refills   allopurinol (ZYLOPRIM) 100 MG tablet [Pharmacy Med Name: ALLOPURINOL 100 MG TABLET] 180 tablet 0    Sig: Take 1 tablet (100 mg total) by mouth 2 (two) times daily.     Endocrinology:  Gout Agents - allopurinol Failed - 06/18/2022 10:55 AM      Failed - Cr in normal range and within 360 days    Creatinine, Ser  Date Value Ref Range Status  01/31/2022 1.28 (H) 0.57 - 1.00 mg/dL Final         Passed - Uric Acid in normal range and within 360 days    Uric Acid  Date Value Ref Range Status  08/04/2021 3.4 3.1 - 7.9 mg/dL Final    Comment:               Therapeutic target for gout patients: <6.0         Passed - Valid encounter within last 12 months    Recent Outpatient Visits           3 months ago Numbness of fingers   [place], [place] T, NP   4 months ago Stage 3b chronic kidney disease ([place])   [place], Henrine Screws T, NP   10 months ago Chronic atrial fibrillation ([place])   [place], Henrine Screws T, NP   1 year ago Chronic atrial fibrillation ([place])   [place], Lauren A, NP   1 year ago Chronic atrial fibrillation ([place])   [place], Henrine Screws T, NP       Future Appointments             In 1 month [place], [place] T, NP [place]            Passed - CBC within normal limits and completed in the last 12 months    WBC  Date Value Ref Range Status  01/31/2022 5.8 3.4 - 10.8 x10E3/uL Final  10/04/2017 10.7 3.6 - 11.0 K/uL Final   RBC  Date Value Ref Range Status  01/31/2022 4.48 3.77 - 5.28 x10E6/uL Final  10/04/2017 4.50 3.80 - 5.20 MIL/uL Final   Hemoglobin  Date Value Ref Range Status  01/31/2022 13.1 11.1 - 15.9 g/dL Final   Hematocrit  Date Value Ref Range Status  01/31/2022 41.2 34.0 - 46.6 % Final    MCHC  Date Value Ref Range Status  01/31/2022 31.8 31.5 - 35.7 g/dL Final  10/04/2017 33.0 32.0 - 36.0 g/dL Final   [facility]  Date Value Ref Range Status  01/31/2022 29.2 26.6 - 33.0 pg Final  10/04/2017 30.4 26.0 - 34.0 pg Final   MCV  Date Value Ref Range Status  01/31/2022 92 79 - 97 fL Final   No results found for: "PLTCOUNTKUC", "LABPLAT", "POCPLA" RDW  Date Value Ref Range Status  01/31/2022 13.5 11.7 - 15.4 % Final

## 2022-08-12 NOTE — Patient Instructions (Signed)
Food Basics for Chronic Kidney Disease Chronic kidney disease (CKD) is when your kidneys are not working well. They cannot remove waste, fluids, and other substances from your blood the way they should. These substances can build up, which can worsen kidney damage and affect how your body works. Eating certain foods can lead to a buildup of these substances. Changing your diet can help prevent more kidney damage. Diet changes may also delay dialysis or even keep you from needing it. What nutrients should I limit? Work with your treatment team and a food expert (dietitian) to make a meal plan that's right for you. Foods you can eat and foods you should limit or avoid will depend on the stage of your kidney disease and any other health conditions you have. The items listed below are not a complete list. Talk with your dietitian to learn what is best for you. Potassium Potassium affects how steadily your heart beats. Too much potassium in your blood can cause an irregular heartbeat or even a heart attack. You may need to limit foods that are high in potassium, such as: Liquid milk and soy milk. Salt substitutes that contain potassium. Fruits like bananas, apricots, nectarines, melon, prunes, raisins, kiwi, and oranges. Vegetables, such as potatoes, sweet potatoes, yams, tomatoes, leafy greens, beets, avocado, pumpkin, and winter squash. Beans, like lima beans. Nuts. Phosphorus Phosphorus is a mineral found in your bones. You need a balance between calcium and phosphorus to build and maintain healthy bones. Too much added phosphorus from the foods you eat can pull calcium from your bones. Losing calcium can make your bones weak and more likely to break. Too much phosphorus can also make your skin itch. You may need to limit foods that are high in phosphorus or that have added phosphorus, such as: Liquid milk and dairy products. Dark-colored sodas or soft drinks. Bran cereals and  oatmeal. Protein  Protein helps you make and keep muscle. Protein also helps to repair your body's cells and tissues. One of the natural breakdown products of protein is a waste product called urea. When your kidneys are not working well, they cannot remove types of waste like urea. Reducing protein in your diet can help keep urea from building up in your blood. Depending on your stage of kidney disease, you may need to eat smaller portions of foods that are high in protein. Sources of animal protein include: Meat (all types). Fish and seafood. Poultry. Eggs. Dairy. Other protein foods include: Beans and legumes. Nuts and nut butter. Soy, like tofu.  Sodium Salt (sodium) helps to keep a healthy balance of fluids in your body. Too much salt can increase your blood pressure, which can harm your heart and lungs. Extra salt can also cause your body to keep too much fluid, making your kidneys work harder. You may need to limit or avoid foods that are high in salt, such as: Salt seasonings. Soy and teriyaki sauce. Packaged, precooked, cured, or processed meats, such as sausages or meat loaves. Sardines. Salted crackers and snack foods. Fast food. Canned soups and most canned foods. Pickled foods. Vegetable juice. Boxed mixes or ready-to-eat boxed meals and side dishes. Bottled dressings, sauces, and marinades. Talk with your dietitian about how much potassium, phosphorus, protein, and salt you may have each day. Helpful tips Read food labels  Check the amount of salt in foods. Limit foods that have salt or sodium listed among the first five ingredients. Try to eat low-salt foods. Check the ingredient list   for added phosphorus or potassium. "Phos" in an ingredient is a sign that phosphorus has been added. Do not buy foods that are calcium-enriched or that have calcium added to them (are fortified). Buy canned vegetables and beans that say "no salt added" and rinse them before  eating. Lifestyle Limit the amount of protein you eat from animal sources each day. Focus on protein from plant sources, like tofu and dried beans, peas, and lentils. Do not add salt to food when cooking or before eating. Do not eat star fruit. It can be toxic for people with kidney problems. Talk with your health care provider before taking any vitamin or mineral supplements. If told by your health care provider, track how much liquid you drink so you can avoid drinking too much. You may need to include foods you eat that are made mostly from water, like gelatin, ice cream, soups, and juicy fruits and vegetables. If you have diabetes: If you have diabetes (diabetes mellitus) and CKD, you need to keep your blood sugar (glucose) in the target range recommended by your health care provider. Follow your diabetes management plan. This may include: Checking your blood glucose regularly. Taking medicines by mouth, or taking insulin, or both. Exercising for at least 30 minutes on 5 or more days each week, or as told by your health care provider. Tracking how many servings of carbohydrates you eat at each meal. Not using orange juice to treat low blood sugars. Instead, use apple juice, cranberry juice, or clear soda. You may be given guidelines on what foods and nutrients you may eat, and how much you can have each day. This depends on your stage of kidney disease and whether you have high blood pressure (hypertension). Follow the meal plan your dietitian gives you. To learn more: National Institute of Diabetes and Digestive and Kidney Diseases: niddk.nih.gov National Kidney Foundation: kidney.org Summary Chronic kidney disease (CKD) is when your kidneys are not working well. They cannot remove waste, fluids, and other substances from your blood the way they should. These substances can build up, which can worsen kidney damage and affect how your body works. Changing your diet can help prevent more  kidney damage. Diet changes may also delay dialysis or even keep you from needing it. Diet changes are different for each person with CKD. Work with a dietitian to set up a meal plan that is right for you. This information is not intended to replace advice given to you by your health care provider. Make sure you discuss any questions you have with your health care provider. Document Revised: 07/21/2021 Document Reviewed: 07/27/2019 Elsevier Patient Education  2023 Elsevier Inc.  

## 2022-08-13 ENCOUNTER — Ambulatory Visit (INDEPENDENT_AMBULATORY_CARE_PROVIDER_SITE_OTHER): Payer: Medicare Other | Admitting: Nurse Practitioner

## 2022-08-13 ENCOUNTER — Encounter: Payer: Self-pay | Admitting: Nurse Practitioner

## 2022-08-13 ENCOUNTER — Other Ambulatory Visit: Payer: Self-pay | Admitting: Cardiovascular Disease

## 2022-08-13 VITALS — BP 120/70 | HR 90 | Temp 97.8°F | Ht 60.98 in | Wt 177.6 lb

## 2022-08-13 DIAGNOSIS — E063 Autoimmune thyroiditis: Secondary | ICD-10-CM

## 2022-08-13 DIAGNOSIS — I340 Nonrheumatic mitral (valve) insufficiency: Secondary | ICD-10-CM | POA: Diagnosis not present

## 2022-08-13 DIAGNOSIS — I700 Atherosclerosis of aorta: Secondary | ICD-10-CM | POA: Diagnosis not present

## 2022-08-13 DIAGNOSIS — N1832 Chronic kidney disease, stage 3b: Secondary | ICD-10-CM

## 2022-08-13 DIAGNOSIS — I10 Essential (primary) hypertension: Secondary | ICD-10-CM

## 2022-08-13 DIAGNOSIS — I482 Chronic atrial fibrillation, unspecified: Secondary | ICD-10-CM | POA: Diagnosis not present

## 2022-08-13 DIAGNOSIS — D6869 Other thrombophilia: Secondary | ICD-10-CM

## 2022-08-13 DIAGNOSIS — M85852 Other specified disorders of bone density and structure, left thigh: Secondary | ICD-10-CM

## 2022-08-13 DIAGNOSIS — D696 Thrombocytopenia, unspecified: Secondary | ICD-10-CM

## 2022-08-13 DIAGNOSIS — Z7901 Long term (current) use of anticoagulants: Secondary | ICD-10-CM | POA: Diagnosis not present

## 2022-08-13 DIAGNOSIS — E66811 Obesity, class 1: Secondary | ICD-10-CM

## 2022-08-13 DIAGNOSIS — Z6832 Body mass index (BMI) 32.0-32.9, adult: Secondary | ICD-10-CM

## 2022-08-13 DIAGNOSIS — E7800 Pure hypercholesterolemia, unspecified: Secondary | ICD-10-CM | POA: Diagnosis not present

## 2022-08-13 DIAGNOSIS — E6609 Other obesity due to excess calories: Secondary | ICD-10-CM

## 2022-08-13 MED ORDER — ALLOPURINOL 100 MG PO TABS: 100.0000 mg | ORAL_TABLET | Freq: Two times a day (BID) | ORAL | 4 refills | Status: DC

## 2022-08-13 NOTE — Progress Notes (Signed)
BP 120/70   Pulse 90   Temp 97.8 F (36.6 C) (Oral)   Ht 5' 0.98" (1.549 m)   Wt 177 lb 9.6 oz (80.6 kg)   LMP  (LMP Unknown)   SpO2 99%   BMI 33.57 kg/m    Subjective:    Patient ID: Amy Myers, female    DOB: [birth year], 77 y.o.   MRN: [id number]  HPI: Amy Myers is a 77 y.o. female  Chief Complaint  Patient presents with   Hyperlipidemia   Hypertension   Atrial Fibrillation   Osteopenia   B-12/Vit D   Hypothyroidism   HYPERTENSION / HYPERLIPIDEMIA  Follows with cardiology, Dr. Welton Flakes.  Last saw in January, goes back next week. Continues on Metoprolol, Rosuvastatin + Eliquis.  Last echo in 2023 per patient, not available in chart.  History of aortic atherosclerosis noted on past imaging. Satisfied with current treatment? yes Duration of hypertension: chronic BP monitoring frequency: not checking BP range:  BP medication side effects: no Duration of hyperlipidemia: chronic Cholesterol medication side effects: no Cholesterol supplements: none Medication compliance: good compliance Aspirin: no Recent stressors: no Recurrent headaches: no Visual changes: no Palpitations: no Dyspnea: no Chest pain: no Lower extremity edema: no Dizzy/lightheaded: no   ATRIAL FIBRILLATION Atrial fibrillation status: stable Satisfied with current treatment: yes  Medication side effects:  no Medication compliance: good compliance Etiology of atrial fibrillation: unknown Palpitations:  no Chest pain:  no Dyspnea on exertion:  no Orthopnea:  no Syncope:  no Edema:  no Ventricular rate control: B-blocker Anti-coagulation: long acting    CHRONIC KIDNEY DISEASE Saw nephrology in 05/08/22 with CRT 1.29 and GFR 43, remaining baseline. Denies any anemia symptoms.   CKD status: stable Medications renally dose: yes Previous renal evaluation: yes Pneumovax:  Up to Date Influenza Vaccine:  Up to Date   HYPOTHYROIDISM Continues on Levothyroxine 75 MCG.   Thyroid control  status:stable Satisfied with current treatment? yes Medication side effects: no Medication compliance: good compliance Etiology of hypothyroidism:  Recent dose adjustment:no Fatigue: no Cold intolerance: no Heat intolerance: no Weight gain: no Weight loss: no Constipation: occasional Diarrhea/loose stools: no Palpitations: no Lower extremity edema: no Anxiety/depressed mood: no    OSTEOPENIA Ongoing on DEXA 02/15/21 with T-score -1.7.  Continues on Vitamin D and B12 at home. Satisfied with current treatment?: yes Adequate calcium & vitamin D: yes Intolerance to bisphosphonates: Evista on holiday Weight bearing exercises: yes  Relevant past medical, surgical, family and social history reviewed and updated as indicated. Interim medical history since our last visit reviewed. Allergies and medications reviewed and updated.  Review of Systems  Constitutional:  Negative for activity change, appetite change, diaphoresis, fatigue and unexpected weight change.  Respiratory:  Negative for cough, chest tightness, shortness of breath and wheezing.   Cardiovascular:  Negative for chest pain, palpitations and leg swelling.  Endocrine: Negative for cold intolerance and heat intolerance.  Neurological: Negative.   Psychiatric/Behavioral: Negative.      Per HPI unless specifically indicated above     Objective:    BP 120/70   Pulse 90   Temp 97.8 F (36.6 C) (Oral)   Ht 5' 0.98" (1.549 m)   Wt 177 lb 9.6 oz (80.6 kg)   LMP  (LMP Unknown)   SpO2 99%   BMI 33.57 kg/m   Wt Readings from Last 3 Encounters:  08/13/22 177 lb 9.6 oz (80.6 kg)  03/14/22 174 lb 11.2 oz (79.2 kg)  01/31/22 173 lb  14.4 oz (78.9 kg)    Physical Exam Vitals and nursing note reviewed.  Constitutional:      General: She is awake. She is not in acute distress.    Appearance: She is well-developed and well-groomed. She is obese. She is not ill-appearing.  HENT:     Head: Normocephalic.     Right Ear:  Hearing normal.     Left Ear: Hearing normal.  Eyes:     General: Lids are normal.        Right eye: No discharge.        Left eye: No discharge.     Conjunctiva/sclera: Conjunctivae normal.     Pupils: Pupils are equal, round, and reactive to light.  Neck:     Thyroid: No thyromegaly.     Vascular: No carotid bruit.  Cardiovascular:     Rate and Rhythm: Normal rate. Rhythm irregularly irregular.     Heart sounds: Murmur heard.     Systolic murmur is present with a grade of 2/6.     No gallop.  Pulmonary:     Effort: Pulmonary effort is normal. No accessory muscle usage or respiratory distress.     Breath sounds: Normal breath sounds.  Abdominal:     General: Bowel sounds are normal.     Palpations: Abdomen is soft.  Musculoskeletal:     Cervical back: Normal range of motion and neck supple.     Right lower leg: No edema.     Left lower leg: No edema.  Skin:    General: Skin is warm and dry.  Neurological:     Mental Status: She is alert and oriented to person, place, and time.  Psychiatric:        Attention and Perception: Attention normal.        Mood and Affect: Mood normal.        Behavior: Behavior normal. Behavior is cooperative.        Thought Content: Thought content normal.        Judgment: Judgment normal.    Results for orders placed or performed in visit on 01/31/22  Microalbumin, Urine Waived  Result Value Ref Range   Microalb, Ur Waived 30 (H) 0 - 19 mg/L   Creatinine, Urine Waived 50 10 - 300 mg/dL   Microalb/Creat Ratio 30-300 (H) <30 mg/g  CBC with Differential/Platelet  Result Value Ref Range   WBC 5.8 3.4 - 10.8 x10E3/uL   RBC 4.48 3.77 - 5.28 x10E6/uL   Hemoglobin 13.1 11.1 - 15.9 g/dL   Hematocrit 16.1 09.6 - 46.6 %   MCV 92 79 - 97 fL   MCH 29.2 26.6 - 33.0 pg   MCHC 31.8 31.5 - 35.7 g/dL   RDW 04.5 40.9 - 81.1 %   Platelets 142 (L) 150 - 450 x10E3/uL   Neutrophils 60 Not Estab. %   Lymphs 30 Not Estab. %   Monocytes 6 Not Estab. %   Eos  3 Not Estab. %   Basos 1 Not Estab. %   Neutrophils Absolute 3.4 1.4 - 7.0 x10E3/uL   Lymphocytes Absolute 1.7 0.7 - 3.1 x10E3/uL   Monocytes Absolute 0.4 0.1 - 0.9 x10E3/uL   EOS (ABSOLUTE) 0.2 0.0 - 0.4 x10E3/uL   Basophils Absolute 0.1 0.0 - 0.2 x10E3/uL   Immature Granulocytes 0 Not Estab. %   Immature Grans (Abs) 0.0 0.0 - 0.1 x10E3/uL  Comprehensive metabolic panel  Result Value Ref Range   Glucose 95 70 - 99 mg/dL  BUN 15 8 - 27 mg/dL   Creatinine, Ser 5.18 (H) 0.57 - 1.00 mg/dL   eGFR 43 (L) >84 ZY/SAY/3.01   BUN/Creatinine Ratio 12 12 - 28   Sodium 138 134 - 144 mmol/L   Potassium 4.1 3.5 - 5.2 mmol/L   Chloride 100 96 - 106 mmol/L   CO2 24 20 - 29 mmol/L   Calcium 9.3 8.7 - 10.3 mg/dL   Total Protein 6.5 6.0 - 8.5 g/dL   Albumin 4.6 3.8 - 4.8 g/dL   Globulin, Total 1.9 1.5 - 4.5 g/dL   Albumin/Globulin Ratio 2.4 (H) 1.2 - 2.2   Bilirubin Total 0.6 0.0 - 1.2 mg/dL   Alkaline Phosphatase 92 44 - 121 IU/L   AST 20 0 - 40 IU/L   ALT 15 0 - 32 IU/L  Lipid Panel w/o Chol/HDL Ratio  Result Value Ref Range   Cholesterol, Total 150 100 - 199 mg/dL   Triglycerides 71 0 - 149 mg/dL   HDL 95 >60 mg/dL   VLDL Cholesterol Cal 14 5 - 40 mg/dL   LDL Chol Calc (NIH) 41 0 - 99 mg/dL  TSH  Result Value Ref Range   TSH 1.660 0.450 - 4.500 uIU/mL  T4, free  Result Value Ref Range   Free T4 1.68 0.82 - 1.77 ng/dL  VITAMIN D 25 Hydroxy (Vit-D Deficiency, Fractures)  Result Value Ref Range   Vit D, 25-Hydroxy 39.5 30.0 - 100.0 ng/mL  Vitamin B12  Result Value Ref Range   Vitamin B-12 488 232 - 1,245 pg/mL      Assessment & Plan:   Problem List Items Addressed This Visit       Cardiovascular and Mediastinum   Aortic atherosclerosis (HCC)    Chronic.  Noted on CT imaging 10/04/17.  Educated patient, continue statin and Eliquis daily.  Recommend focus on healthy diet and regular activity.      Chronic atrial fibrillation (HCC)    Chronic, rate controlled.  Continue current  medication regimen and collaboration with cardiology (Dr. Welton Flakes).  Attempt to get updated notes.      Relevant Orders   Lipid Panel w/o Chol/HDL Ratio   Hypertension    Chronic, stable.  BP at goal at in office.  Followed by cardiology and nephrology.  Continue current medication regimen and adjust as needed.  Continue collaboration with cardiologist and nephrologist.  LABS: up to date.  Recommend she continue to monitor BP at home a few days a week and document + focus on DASH diet.  Return in 6 months.      Mitral valve regurgitation    Noted on past echo, mild to moderate.  No symptoms at this time.  Continue current medication regimen and collaboration with cardiology.        Endocrine   Hashimoto's thyroiditis    Chronic, ongoing.  Continue current medication regimen and adjust as needed.  Thyroid labs today.  ATA guidelines recommend goal for age <6.          Musculoskeletal and Integument   Osteopenia of neck of left femur    Chronic, ongoing.  Noted on 2017 DEXA and repeat in November 2022 (T-score -1.7).  Continue supplements at home and gentle weight bearing exercises.  Check Vit D next visit, recent was stable. Continue Evista holiday.  Plan on recheck DEXA in November 2027.        Genitourinary   Stage 3 chronic kidney disease (HCC) - Primary    Chronic, stable  with no decline noted on recent nephrology labs.  Continue collaboration with nephrology.  No current ACE or ARB, may benefit from this in future if no past side effects with these OR consider addition of Farxiga for heart and kidney health.  Labs up to date.  Renal dose medications as needed.        Hematopoietic and Hemostatic   Other thrombophilia (HCC)    Continues on Eliquis for atrial fibrillation.  Recent CBC with nephrology remains stable.  She is to notify provider if any increased bruising or skin breakdown.      Thrombocytopenia (HCC)    Noted on on past labs intermittently.  Continue to monitor.   CBC up to date.        Other   Hyperlipidemia    Chronic, ongoing.  Continue current medication regimen and adjust as needed based on labs.  Lipid panel today.      Relevant Orders   Lipid Panel w/o Chol/HDL Ratio   Long term (current) use of anticoagulants    Chronic, ongoing with a-fib.  Continue current medication regimen and check CBC annually.  Continue collaboration with cardiology.      Obesity    BMI 33.57 with a-fib and HTN + CKD.  Recommended eating smaller high protein, low fat meals more frequently and exercising 30 mins a day 5 times a week with a goal of 10-15lb weight loss in the next 3 months. Patient voiced their understanding and motivation to adhere to these recommendations.          Follow up plan: Return in about 6 months (around 02/12/2023) for HTN/HLD, A-FIB, HYPOTHYROID, OSTEOPENIA, CKD.

## 2022-08-13 NOTE — Assessment & Plan Note (Signed)
Chronic, ongoing with a-fib.  Continue current medication regimen and check CBC annually.  Continue collaboration with cardiology.

## 2022-08-13 NOTE — Assessment & Plan Note (Signed)
Noted on on past labs intermittently.  Continue to monitor.  CBC up to date.

## 2022-08-13 NOTE — Assessment & Plan Note (Signed)
Chronic, stable with no decline noted on recent nephrology labs.  Continue collaboration with nephrology.  No current ACE or ARB, may benefit from this in future if no past side effects with these OR consider addition of Farxiga for heart and kidney health.  Labs up to date.  Renal dose medications as needed.

## 2022-08-13 NOTE — Assessment & Plan Note (Signed)
BMI 33.57 with a-fib and HTN + CKD.  Recommended eating smaller high protein, low fat meals more frequently and exercising 30 mins a day 5 times a week with a goal of 10-15lb weight loss in the next 3 months. Patient voiced their understanding and motivation to adhere to these recommendations.

## 2022-08-13 NOTE — Assessment & Plan Note (Signed)
Chronic, rate controlled.  Continue current medication regimen and collaboration with cardiology (Dr. Khan).  Attempt to get updated notes. 

## 2022-08-13 NOTE — Assessment & Plan Note (Signed)
Chronic, ongoing.  Continue current medication regimen and adjust as needed based on labs.  Lipid panel today. 

## 2022-08-13 NOTE — Assessment & Plan Note (Signed)
Chronic.  Noted on CT imaging 10/04/17.  Educated patient, continue statin and Eliquis daily.  Recommend focus on healthy diet and regular activity. 

## 2022-08-13 NOTE — Assessment & Plan Note (Signed)
Continues on Eliquis for atrial fibrillation.  Recent CBC with nephrology remains stable.  She is to notify provider if any increased bruising or skin breakdown. 

## 2022-08-13 NOTE — Assessment & Plan Note (Signed)
Noted on past echo, mild to moderate.  No symptoms at this time.  Continue current medication regimen and collaboration with cardiology. ?

## 2022-08-13 NOTE — Assessment & Plan Note (Signed)
Chronic, stable.  BP at goal at in office.  Followed by cardiology and nephrology.  Continue current medication regimen and adjust as needed.  Continue collaboration with cardiologist and nephrologist.  LABS: up to date.  Recommend she continue to monitor BP at home a few days a week and document + focus on DASH diet.  Return in 6 months.

## 2022-08-13 NOTE — Assessment & Plan Note (Signed)
Chronic, ongoing.  Continue current medication regimen and adjust as needed.  Thyroid labs today.  ATA guidelines recommend goal for age <6.   

## 2022-08-13 NOTE — Assessment & Plan Note (Signed)
Chronic, ongoing.  Noted on 2017 DEXA and repeat in November 2022 (T-score -1.7).  Continue supplements at home and gentle weight bearing exercises.  Check Vit D next visit, recent was stable. Continue Evista holiday.  Plan on recheck DEXA in November 2027. 

## 2022-08-14 LAB — LIPID PANEL W/O CHOL/HDL RATIO
Cholesterol, Total: 140 mg/dL (ref 100–199)
HDL: 88 mg/dL (ref >39)
LDL Chol Calc (NIH): 40 mg/dL (ref 0–99)
Triglycerides: 57 mg/dL (ref 0–149)
VLDL Cholesterol Cal: 12 mg/dL (ref 5–40)

## 2022-08-14 NOTE — Progress Notes (Signed)
Good morning, please let Becky know cholesterol levels remain at goal and no medication changes needed.  She is doing fabulous!!  Keep up the good work!! Keep being stellar!!  Thank you for allowing me to participate in your care.  I appreciate you. Kindest regards, Sanam Marmo

## 2022-08-23 ENCOUNTER — Encounter: Payer: Self-pay | Admitting: Cardiovascular Disease

## 2022-08-23 ENCOUNTER — Ambulatory Visit (INDEPENDENT_AMBULATORY_CARE_PROVIDER_SITE_OTHER): Payer: Medicare Other | Admitting: Cardiovascular Disease

## 2022-08-23 VITALS — BP 114/76 | HR 99 | Ht 59.0 in | Wt 177.2 lb

## 2022-08-23 DIAGNOSIS — Z01818 Encounter for other preprocedural examination: Secondary | ICD-10-CM

## 2022-08-23 DIAGNOSIS — I10 Essential (primary) hypertension: Secondary | ICD-10-CM

## 2022-08-23 DIAGNOSIS — I482 Chronic atrial fibrillation, unspecified: Secondary | ICD-10-CM

## 2022-08-23 DIAGNOSIS — E782 Mixed hyperlipidemia: Secondary | ICD-10-CM

## 2022-08-23 DIAGNOSIS — I700 Atherosclerosis of aorta: Secondary | ICD-10-CM | POA: Diagnosis not present

## 2022-08-23 DIAGNOSIS — I340 Nonrheumatic mitral (valve) insufficiency: Secondary | ICD-10-CM
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
## 2022-09-11 ENCOUNTER — Other Ambulatory Visit: Payer: Self-pay | Admitting: Cardiovascular Disease

## 2022-09-11 DIAGNOSIS — I482 Chronic atrial fibrillation, unspecified: Secondary | ICD-10-CM

## 2022-10-09 ENCOUNTER — Other Ambulatory Visit: Payer: Self-pay | Admitting: Cardiovascular Disease

## 2022-10-09 DIAGNOSIS — I482 Chronic atrial fibrillation, unspecified: Secondary | ICD-10-CM

## 2022-10-22 DIAGNOSIS — I10 Essential (primary) hypertension: Secondary | ICD-10-CM | POA: Diagnosis not present

## 2022-10-22 DIAGNOSIS — N1832 Chronic kidney disease, stage 3b: Secondary | ICD-10-CM | POA: Diagnosis not present

## 2022-10-22 DIAGNOSIS — I129 Hypertensive chronic kidney disease with stage 1 through stage 4 chronic kidney disease, or unspecified chronic kidney disease: Secondary | ICD-10-CM | POA: Diagnosis not present

## 2022-10-29 ENCOUNTER — Other Ambulatory Visit: Payer: Self-pay | Admitting: Nurse Practitioner

## 2022-10-29 DIAGNOSIS — Z1231 Encounter for screening mammogram for malignant neoplasm of breast: Secondary | ICD-10-CM

## 2022-10-30 DIAGNOSIS — N1832 Chronic kidney disease, stage 3b: Secondary | ICD-10-CM | POA: Diagnosis not present

## 2022-10-30 DIAGNOSIS — I10 Essential (primary) hypertension: Secondary | ICD-10-CM | POA: Diagnosis not present

## 2022-10-30 DIAGNOSIS — I129 Hypertensive chronic kidney disease with stage 1 through stage 4 chronic kidney disease, or unspecified chronic kidney disease: Secondary | ICD-10-CM | POA: Diagnosis not present

## 2022-11-05 DIAGNOSIS — H25013 Cortical age-related cataract, bilateral: Secondary | ICD-10-CM | POA: Diagnosis not present

## 2022-11-05 DIAGNOSIS — H35363 Drusen (degenerative) of macula, bilateral: Secondary | ICD-10-CM | POA: Diagnosis not present

## 2022-11-05 DIAGNOSIS — H5340 Unspecified visual field defects: Secondary | ICD-10-CM | POA: Diagnosis not present

## 2022-11-05 DIAGNOSIS — H40023 Open angle with borderline findings, high risk, bilateral: Secondary | ICD-10-CM | POA: Diagnosis not present

## 2022-11-07 ENCOUNTER — Other Ambulatory Visit: Payer: Self-pay | Admitting: Cardiovascular Disease

## 2022-11-08 ENCOUNTER — Other Ambulatory Visit: Payer: Self-pay | Admitting: Cardiovascular Disease

## 2022-11-12 ENCOUNTER — Other Ambulatory Visit: Payer: Self-pay | Admitting: Cardiovascular Disease

## 2022-11-12 DIAGNOSIS — I482 Chronic atrial fibrillation, unspecified: Secondary | ICD-10-CM

## 2022-11-23 ENCOUNTER — Ambulatory Visit: Payer: Medicare Other | Admitting: Cardiovascular Disease

## 2022-11-30 ENCOUNTER — Ambulatory Visit: Payer: Medicare Other | Admitting: Cardiovascular Disease

## 2022-11-30 ENCOUNTER — Encounter: Payer: Self-pay | Admitting: Cardiovascular Disease

## 2022-11-30 VITALS — BP 130/88 | HR 98 | Ht 59.0 in | Wt 179.6 lb

## 2022-11-30 DIAGNOSIS — E782 Mixed hyperlipidemia: Secondary | ICD-10-CM | POA: Diagnosis not present

## 2022-11-30 DIAGNOSIS — I482 Chronic atrial fibrillation, unspecified: Secondary | ICD-10-CM

## 2022-11-30 DIAGNOSIS — R0602 Shortness of breath: Secondary | ICD-10-CM

## 2022-11-30 DIAGNOSIS — I340 Nonrheumatic mitral (valve) insufficiency: Secondary | ICD-10-CM | POA: Diagnosis not present

## 2022-11-30 DIAGNOSIS — I10 Essential (primary) hypertension: Secondary | ICD-10-CM

## 2022-11-30 DIAGNOSIS — I700 Atherosclerosis of aorta: Secondary | ICD-10-CM
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
## 2022-12-10 ENCOUNTER — Ambulatory Visit
Admission: RE | Admit: 2022-12-10 | Discharge: 2022-12-10 | Disposition: A | Discharge status: Home / Self Care | Payer: Medicare Other | Source: Ambulatory Visit | Attending: Nurse Practitioner | Admitting: Nurse Practitioner

## 2022-12-10 DIAGNOSIS — Z1231 Encounter for screening mammogram for malignant neoplasm of breast: Secondary | ICD-10-CM | POA: Insufficient documentation

## 2022-12-12 ENCOUNTER — Ambulatory Visit (INDEPENDENT_AMBULATORY_CARE_PROVIDER_SITE_OTHER): Payer: Medicare Other

## 2022-12-12 DIAGNOSIS — R0602 Shortness of breath: Secondary | ICD-10-CM

## 2022-12-12 DIAGNOSIS — I340 Nonrheumatic mitral (valve) insufficiency: Secondary | ICD-10-CM

## 2022-12-12 DIAGNOSIS — I700 Atherosclerosis of aorta: Secondary | ICD-10-CM

## 2022-12-12 DIAGNOSIS — E782 Mixed hyperlipidemia: Secondary | ICD-10-CM

## 2022-12-12 DIAGNOSIS — I482 Chronic atrial fibrillation, unspecified: Secondary | ICD-10-CM | POA: Diagnosis not present

## 2022-12-12 DIAGNOSIS — I10 Essential (primary) hypertension: Secondary | ICD-10-CM

## 2022-12-12 MED ORDER — TECHNETIUM TC 99M SESTAMIBI GENERIC - CARDIOLITE
32.1000 | Freq: Once | INTRAVENOUS | Status: AC | PRN
  Administered 2022-12-12: 32.1 via INTRAVENOUS

## 2022-12-12 MED ORDER — TECHNETIUM TC 99M SESTAMIBI GENERIC - CARDIOLITE
10.4000 | Freq: Once | INTRAVENOUS | Status: AC | PRN
  Administered 2022-12-12: 10.4 via INTRAVENOUS

## 2022-12-13 ENCOUNTER — Other Ambulatory Visit: Payer: Self-pay | Admitting: Cardiovascular Disease

## 2022-12-13 DIAGNOSIS — I482 Chronic atrial fibrillation, unspecified: Secondary | ICD-10-CM

## 2022-12-14 ENCOUNTER — Ambulatory Visit (INDEPENDENT_AMBULATORY_CARE_PROVIDER_SITE_OTHER): Payer: Medicare Other

## 2022-12-14 DIAGNOSIS — I340 Nonrheumatic mitral (valve) insufficiency: Secondary | ICD-10-CM

## 2022-12-14 DIAGNOSIS — I10 Essential (primary) hypertension: Secondary | ICD-10-CM

## 2022-12-14 DIAGNOSIS — I700 Atherosclerosis of aorta: Secondary | ICD-10-CM

## 2022-12-14 DIAGNOSIS — I482 Chronic atrial fibrillation, unspecified: Secondary | ICD-10-CM | POA: Diagnosis not present

## 2022-12-14 DIAGNOSIS — I361 Nonrheumatic tricuspid (valve) insufficiency: Secondary | ICD-10-CM | POA: Diagnosis not present

## 2022-12-14 DIAGNOSIS — R0602 Shortness of breath: Secondary | ICD-10-CM

## 2022-12-14 DIAGNOSIS — E782 Mixed hyperlipidemia: Secondary | ICD-10-CM

## 2022-12-18 ENCOUNTER — Ambulatory Visit (INDEPENDENT_AMBULATORY_CARE_PROVIDER_SITE_OTHER): Payer: Medicare Other | Admitting: Cardiovascular Disease

## 2022-12-18 ENCOUNTER — Encounter: Payer: Self-pay | Admitting: Cardiovascular Disease

## 2022-12-18 VITALS — BP 130/95 | HR 84 | Ht 59.0 in | Wt 178.2 lb

## 2022-12-18 DIAGNOSIS — I340 Nonrheumatic mitral (valve) insufficiency: Secondary | ICD-10-CM | POA: Diagnosis not present

## 2022-12-18 DIAGNOSIS — E782 Mixed hyperlipidemia: Secondary | ICD-10-CM

## 2022-12-18 DIAGNOSIS — I482 Chronic atrial fibrillation, unspecified: Secondary | ICD-10-CM

## 2022-12-18 DIAGNOSIS — I10 Essential (primary) hypertension: Secondary | ICD-10-CM | POA: Diagnosis not present

## 2022-12-18 DIAGNOSIS — I2510 Atherosclerotic heart disease of native coronary artery without angina pectoris: Secondary | ICD-10-CM | POA: Diagnosis not present

## 2022-12-18 MED ORDER — LISINOPRIL 2.5 MG PO TABS: 2.5000 mg | ORAL_TABLET | Freq: Every day | ORAL | 11 refills | Status: DC
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
## 2023-01-09 ENCOUNTER — Other Ambulatory Visit: Payer: Self-pay | Admitting: Nurse Practitioner

## 2023-01-09 ENCOUNTER — Other Ambulatory Visit: Payer: Self-pay | Admitting: Cardiovascular Disease

## 2023-01-09 DIAGNOSIS — I482 Chronic atrial fibrillation, unspecified: Secondary | ICD-10-CM

## 2023-01-10 NOTE — Telephone Encounter (Signed)
Requested Prescriptions  Pending Prescriptions Disp Refills   rosuvastatin (CRESTOR) 20 MG tablet [Pharmacy Med Name: ROSUVASTATIN CALCIUM 20 MG TAB] 90 tablet 0    Sig: Take 1 tablet (20 mg total) by mouth daily.     Cardiovascular:  Antilipid - Statins 2 Failed - 01/09/2023 10:16 AM      Failed - Cr in normal range and within 360 days    Creatinine, Ser  Date Value Ref Range Status  01/31/2022 1.28 (H) 0.57 - 1.00 mg/dL Final         Failed - Lipid Panel in normal range within the last 12 months    Cholesterol, Total  Date Value Ref Range Status  08/13/2022 140 100 - 199 mg/dL Final   Cholesterol Piccolo, Waived  Date Value Ref Range Status  06/24/2015 128 <200 mg/dL Final    Comment:                            Desirable                <200                         Borderline High      200- 239                         High                     >239    LDL Chol Calc (NIH)  Date Value Ref Range Status  08/13/2022 40 0 - 99 mg/dL Final   HDL  Date Value Ref Range Status  08/13/2022 88 >39 mg/dL Final   Triglycerides  Date Value Ref Range Status  08/13/2022 57 0 - 149 mg/dL Final   Triglycerides Piccolo,Waived  Date Value Ref Range Status  06/24/2015 84 <150 mg/dL Final    Comment:                            Normal                   <150                         Borderline High     150 - 199                         High                200 - 499                         Very High                >499          Passed - Patient is not pregnant      Passed - Valid encounter within last 12 months    Recent Outpatient Visits           5 months ago Stage 3b chronic kidney disease (HCC)   [place] [facility] [place], Jolene T, NP   10 months ago Numbness of fingers   [place] [facility] [place], [place] T, NP   11 months ago Stage 3b chronic  kidney disease (HCC)   [place] [facility] [place], Corrie Dandy T, NP   1 year ago  Chronic atrial fibrillation [facility])   [facility] [place], Corrie Dandy T, NP   1 year ago Chronic atrial fibrillation [facility])   [place] [facility] Gerre Scull, NP       Future Appointments             In 2 weeks Marjie Skiff, NP  [facility]   In 2 months Laurier Nancy, MD [facility]

## 2023-01-27 NOTE — Patient Instructions (Signed)
Be Involved in Caring For Your Health:  Taking Medications When medications are taken as directed, they can greatly improve your health. But if they are not taken as prescribed, they may not work. In some cases, not taking them correctly can be harmful. To help ensure your treatment remains effective and safe, understand your medications and how to take them. Bring your medications to each visit for review by your provider.  Your lab results, notes, and after visit summary will be available on My Chart. We strongly encourage you to use this feature. If lab results are abnormal the clinic will contact you with the appropriate steps. If the clinic does not contact you assume the results are satisfactory. You can always view your results on My Chart. If you have questions regarding your health or results, please contact the clinic during office hours. You can also ask questions on My Chart.  We at [facility] are grateful that you chose [place] to provide your care. We strive to provide evidence-based and compassionate care and are always looking for feedback. If you get a survey from the clinic please complete this so we can hear your opinions.  Atrial Fibrillation Atrial fibrillation (AFib) is a type of heartbeat that is irregular or fast. If you have AFib, your heart beats without any order. This makes it hard for your heart to pump blood in a normal way. AFib may come and go, or it may become a long-lasting problem. If AFib is not treated, it can put you at higher risk for stroke, heart failure, and other heart problems. What are the causes? AFib may be caused by diseases that damage the heart's electrical system. They include: High blood pressure. Heart failure. Heart valve diseases. Heart surgery. Diabetes. Thyroid disease. Kidney disease. Lung diseases, such as pneumonia or COPD. Sleep apnea. Sometimes the cause is not known. What increases the risk? You are more likely to  develop AFib if: You are older. You exercise often and very hard. You have a family history of AFib. You are female. You are Caucasian. You are overweight. You smoke. You drink a lot of alcohol. What are the signs or symptoms? Common symptoms of this condition include: A feeling that your heart is beating very fast. Chest pain or discomfort. Feeling short of breath. Suddenly feeling light-headed or weak. Getting tired easily during activity. Fainting. Sweating. In some cases, there are no symptoms. How is this treated? Medicines to: Prevent blood clots. Treat heart rate or heart rhythm problems. Using devices, such as a pacemaker, to correct heart rhythm problems. Doing surgery to remove the part of the heart that sends bad signals. Closing an area where clots can form in the heart (left atrial appendage). In some cases, your doctor will treat other underlying conditions. Follow these instructions at home: Medicines Take over-the-counter and prescription medicines only as told by your doctor. Do not take any new medicines without first talking to your doctor. If you are taking blood thinners: Talk with your doctor before taking aspirin or NSAIDs, such as ibuprofen. Take your medicines as told. Take them at the same time each day. Do not do things that could hurt or bruise you. Be careful to avoid falls. Wear an alert bracelet or carry a card that says you take blood thinners. Lifestyle Do not smoke or use any products that contain nicotine or tobacco. If you need help quitting, ask your doctor. Eat heart-healthy foods. Talk with your doctor about the right eating plan  for you. Exercise regularly as told by your doctor. Do not drink alcohol. Lose weight if you are overweight. General instructions If you have sleep apnea, treat it as told by your doctor. Do not use diet pills unless your doctor says they are safe for you. Diet pills may make heart problems worse. Keep all  follow-up visits. Your doctor will check your heart rate and rhythm regularly. Contact a doctor if: You notice a change in the speed, rhythm, or strength of your heartbeat. You are taking a blood-thinning medicine and you get more bruising. You get tired more easily when you move or exercise. You have a sudden change in weight. Get help right away if:  You have pain in your chest. You have trouble breathing. You have side effects of blood thinners, such as blood in your vomit, poop (stool), or pee (urine), or bleeding that cannot stop. You have any signs of a stroke. "BE FAST" is an easy way to remember the main warning signs: B - Balance. Dizziness, sudden trouble walking, or loss of balance. E - Eyes. Trouble seeing or a change in how you see. F - Face. Sudden weakness or loss of feeling in the face. The face or eyelid may droop on one side. A - Arms.Weakness or loss of feeling in an arm. This happens suddenly and usually on one side of the body. S - Speech. Sudden trouble speaking, slurred speech, or trouble understanding what people say. T - Time.Time to call emergency services. Write down what time symptoms started. You have other signs of a stroke, such as: A sudden, very bad headache with no known cause. Feeling like you may vomit (nausea). Vomiting. A seizure. These symptoms may be an emergency. Get help right away. Call 911. Do not wait to see if the symptoms will go away. Do not drive yourself to the hospital. This information is not intended to replace advice given to you by your health care provider. Make sure you discuss any questions you have with your health care provider. Document Revised: 12/20/2021 Document Reviewed: 12/20/2021 Elsevier Patient Education  2024 [employer].

## 2023-01-29 ENCOUNTER — Ambulatory Visit (INDEPENDENT_AMBULATORY_CARE_PROVIDER_SITE_OTHER): Payer: Medicare Other | Admitting: Nurse Practitioner

## 2023-01-29 ENCOUNTER — Ambulatory Visit: Payer: Medicare Other | Admitting: Emergency Medicine

## 2023-01-29 ENCOUNTER — Encounter: Payer: Self-pay | Admitting: Nurse Practitioner

## 2023-01-29 VITALS — Ht 59.0 in | Wt 178.0 lb

## 2023-01-29 VITALS — BP 124/72 | HR 85 | Temp 97.6°F | Ht 59.0 in | Wt 178.0 lb

## 2023-01-29 DIAGNOSIS — Z0000 Encounter for general adult medical examination without abnormal findings: Secondary | ICD-10-CM

## 2023-01-29 DIAGNOSIS — D696 Thrombocytopenia, unspecified: Secondary | ICD-10-CM | POA: Diagnosis not present

## 2023-01-29 DIAGNOSIS — E538 Deficiency of other specified B group vitamins: Secondary | ICD-10-CM

## 2023-01-29 DIAGNOSIS — E782 Mixed hyperlipidemia: Secondary | ICD-10-CM

## 2023-01-29 DIAGNOSIS — M85852 Other specified disorders of bone density and structure, left thigh: Secondary | ICD-10-CM | POA: Diagnosis not present

## 2023-01-29 DIAGNOSIS — N1832 Chronic kidney disease, stage 3b: Secondary | ICD-10-CM | POA: Diagnosis not present

## 2023-01-29 DIAGNOSIS — I482 Chronic atrial fibrillation, unspecified: Secondary | ICD-10-CM | POA: Diagnosis not present

## 2023-01-29 DIAGNOSIS — E063 Autoimmune thyroiditis: Secondary | ICD-10-CM | POA: Diagnosis not present

## 2023-01-29 DIAGNOSIS — D6869 Other thrombophilia: Secondary | ICD-10-CM

## 2023-01-29 DIAGNOSIS — I10 Essential (primary) hypertension: Secondary | ICD-10-CM | POA: Diagnosis not present

## 2023-01-29 DIAGNOSIS — M1A3720 Chronic gout due to renal impairment, left ankle and foot, without tophus (tophi): Secondary | ICD-10-CM

## 2023-01-29 DIAGNOSIS — Z23 Encounter for immunization: Secondary | ICD-10-CM | POA: Diagnosis not present

## 2023-01-29 DIAGNOSIS — I700 Atherosclerosis of aorta: Secondary | ICD-10-CM

## 2023-01-29 DIAGNOSIS — E66811 Obesity, class 1: Secondary | ICD-10-CM

## 2023-01-29 MED ORDER — LEVOTHYROXINE SODIUM 75 MCG PO TABS: 75.0000 ug | ORAL_TABLET | Freq: Every day | ORAL | 4 refills | Status: DC

## 2023-01-29 MED ORDER — ROSUVASTATIN CALCIUM 20 MG PO TABS: 20.0000 mg | ORAL_TABLET | Freq: Every day | ORAL | 4 refills | Status: DC

## 2023-01-29 MED ORDER — ALLOPURINOL 100 MG PO TABS: 100.0000 mg | ORAL_TABLET | Freq: Every day | ORAL | Status: DC

## 2023-01-29 NOTE — Assessment & Plan Note (Signed)
Will check vitamin D level today and adjust regimen as needed.

## 2023-01-29 NOTE — Assessment & Plan Note (Signed)
Noted on on past labs intermittently.  Continue to monitor.  CBC up to date.

## 2023-01-29 NOTE — Assessment & Plan Note (Signed)
Chronic, stable.  BP at goal at in office on recheck.  Followed by cardiology and nephrology.  Continue current medication regimen and adjust as needed.  Continue collaboration with cardiologist and nephrologist.  LABS: up to date with nephrology.  Recommend she continue to monitor BP at home a few days a week and document + focus on DASH diet.  Return in 6 months.

## 2023-01-29 NOTE — Assessment & Plan Note (Signed)
Chronic, stable with no decline noted on recent nephrology labs.  Continue collaboration with nephrology.  Taking ACE, may benefit Farxiga in future for further kidney protection.  Labs up to date.  Renal dose medications as needed.

## 2023-01-29 NOTE — Assessment & Plan Note (Signed)
Continues on Eliquis for atrial fibrillation.  Recent CBC with nephrology remains stable.  She is to notify provider if any increased bruising or skin breakdown.

## 2023-01-29 NOTE — Assessment & Plan Note (Signed)
BMI 35.95 with a-fib and HTN + CKD.  Recommended eating smaller high protein, low fat meals more frequently and exercising 30 mins a day 5 times a week with a goal of 10-15lb weight loss in the next 3 months. Patient voiced their understanding and motivation to adhere to these recommendations.

## 2023-01-29 NOTE — Assessment & Plan Note (Signed)
Chronic, ongoing.  On 2017 DEXA and repeat in November 2022 (T-score -1.7).  Continue supplements at home and gentle weight bearing exercises.  Check Vit D, recent was stable. Continue Evista holiday.  Plan on recheck DEXA in November 2027.

## 2023-01-29 NOTE — Progress Notes (Signed)
BP 124/72 (BP Location: Left Arm, Patient Position: Sitting)   Pulse 85   Temp 97.6 F (36.4 C) (Oral)   Ht 4\' 11"  (1.499 m)   Wt 178 lb (80.7 kg)   LMP  (LMP Unknown)   SpO2 98%   BMI 35.95 kg/m    Subjective:    Patient ID: Amy Myers, female    DOB: 08/05/1945, 77 y.o.   MRN: [id number]  HPI: Amy Myers is a 77 y.o. female  Chief Complaint  Patient presents with   Hyperlipidemia   Hypertension   Chronic Kidney Disease   Atrial Fibrillation   Hypothyroidism   HYPERTENSION / HYPERLIPIDEMIA  Continues on Metoprolol, Rosuvastatin, Lisinopril, + Eliquis.  Last echo in August 2024 per with Dr. Welton Flakes -- EF 76.8% and had stress test which she reports he told her was not as good as he wanted it.  Last saw Dr. Welton Flakes on 12/18/22.  History of aortic atherosclerosis noted on past imaging. Satisfied with current treatment? yes Duration of hypertension: chronic BP monitoring frequency: not checking BP range:  BP medication side effects: no Duration of hyperlipidemia: chronic Cholesterol medication side effects: no Cholesterol supplements: none Medication compliance: good compliance Aspirin: no Recent stressors: no Recurrent headaches: no Visual changes: no Palpitations: no Dyspnea: no Chest pain: no Lower extremity edema: no Dizzy/lightheaded: no  The 10-year ASCVD risk score (Arnett DK, et al., 2019) is: 28.6%   Values used to calculate the score:     Age: 12 years     Sex: Female     Is Non-Hispanic African American: No     Diabetic: No     Tobacco smoker: No     Systolic Blood Pressure: 124 mmHg     Is BP treated: Yes     HDL Cholesterol: 88 mg/dL     Total Cholesterol: 140 mg/dL  ATRIAL FIBRILLATION Atrial fibrillation status: stable Satisfied with current treatment: yes  Medication side effects:  no Medication compliance: good compliance Etiology of atrial fibrillation: unknown Palpitations:  no Chest pain:  no Dyspnea on exertion:  no Orthopnea:   no Syncope:  no Edema:  no Ventricular rate control: B-blocker Anti-coagulation: long acting    CHRONIC KIDNEY DISEASE Saw nephrology in 10/30/22 with CRT 1.32 and GFR 431, remaining baseline. Denies any anemia symptoms.    Takes Allopurinol for gout, has not had a flare for many years. CKD status: stable Medications renally dose: yes Previous renal evaluation: yes Pneumovax:  Up to Date Influenza Vaccine:  Up to Date   HYPOTHYROIDISM Taking Levothyroxine 75 MCG.   Thyroid control status:stable Satisfied with current treatment? yes Medication side effects: no Medication compliance: good compliance Etiology of hypothyroidism:  Recent dose adjustment:no Fatigue: no Cold intolerance: no Heat intolerance: no Weight gain: no Weight loss: no Constipation: occasional Diarrhea/loose stools: occasional Palpitations: no Lower extremity edema: no Anxiety/depressed mood: no    OSTEOPENIA DEXA 02/15/21 with T-score -1.7.  Taking Vitamin D and B12 at home. Satisfied with current treatment?: yes Adequate calcium & vitamin D: yes Intolerance to bisphosphonates: Evista on holiday Weight bearing exercises: yes  Relevant past medical, surgical, family and social history reviewed and updated as indicated. Interim medical history since our last visit reviewed. Allergies and medications reviewed and updated.  Review of Systems  Constitutional:  Negative for activity change, appetite change, diaphoresis, fatigue and unexpected weight change.  Respiratory:  Negative for cough, chest tightness, shortness of breath and wheezing.   Cardiovascular:  Negative for  chest pain, palpitations and leg swelling.  Endocrine: Negative for cold intolerance and heat intolerance.  Neurological: Negative.   Psychiatric/Behavioral: Negative.      Per HPI unless specifically indicated above     Objective:    BP 124/72 (BP Location: Left Arm, Patient Position: Sitting)   Pulse 85   Temp 97.6 F (36.4  C) (Oral)   Ht 4\' 11"  (1.499 m)   Wt 178 lb (80.7 kg)   LMP  (LMP Unknown)   SpO2 98%   BMI 35.95 kg/m   Wt Readings from Last 3 Encounters:  01/29/23 178 lb (80.7 kg)  12/18/22 178 lb 3.2 oz (80.8 kg)  11/30/22 179 lb 9.6 oz (81.5 kg)    Physical Exam Vitals and nursing note reviewed.  Constitutional:      General: She is awake. She is not in acute distress.    Appearance: She is well-developed and well-groomed. She is obese. She is not ill-appearing or toxic-appearing.     Comments: Using cane per baseline.  HENT:     Head: Normocephalic.     Right Ear: Hearing normal.     Left Ear: Hearing normal.  Eyes:     General: Lids are normal.        Right eye: No discharge.        Left eye: No discharge.     Conjunctiva/sclera: Conjunctivae normal.     Pupils: Pupils are equal, round, and reactive to light.  Neck:     Thyroid: No thyromegaly.     Vascular: No carotid bruit.  Cardiovascular:     Rate and Rhythm: Normal rate. Rhythm irregularly irregular.     Heart sounds: Murmur heard.     Systolic murmur is present with a grade of 2/6.     No gallop.  Pulmonary:     Effort: Pulmonary effort is normal. No accessory muscle usage or respiratory distress.     Breath sounds: Normal breath sounds.  Abdominal:     General: Bowel sounds are normal.     Palpations: Abdomen is soft.  Musculoskeletal:     Cervical back: Normal range of motion and neck supple.     Right lower leg: No edema.     Left lower leg: No edema.  Skin:    General: Skin is warm and dry.  Neurological:     Mental Status: She is alert and oriented to person, place, and time.  Psychiatric:        Attention and Perception: Attention normal.        Mood and Affect: Mood normal.        Behavior: Behavior normal. Behavior is cooperative.        Thought Content: Thought content normal.        Judgment: Judgment normal.    Results for orders placed or performed in visit on 08/13/22  Lipid Panel w/o Chol/HDL  Ratio  Result Value Ref Range   Cholesterol, Total 140 100 - 199 mg/dL   Triglycerides 57 0 - 149 mg/dL   HDL 88 >16 mg/dL   VLDL Cholesterol Cal 12 5 - 40 mg/dL   LDL Chol Calc (NIH) 40 0 - 99 mg/dL      Assessment & Plan:   Problem List Items Addressed This Visit       Cardiovascular and Mediastinum   Chronic atrial fibrillation (HCC) - Primary    Chronic, rate controlled.  Continue current medication regimen and collaboration with cardiology (Dr. Welton Flakes).  Recent  notes and imaging reviewed in chart.      Relevant Medications   rosuvastatin (CRESTOR) 20 MG tablet   Hypertension    Chronic, stable.  BP at goal at in office on recheck.  Followed by cardiology and nephrology.  Continue current medication regimen and adjust as needed.  Continue collaboration with cardiologist and nephrologist.  LABS: up to date with nephrology.  Recommend she continue to monitor BP at home a few days a week and document + focus on DASH diet.  Return in 6 months.      Relevant Medications   rosuvastatin (CRESTOR) 20 MG tablet     Endocrine   Hashimoto's thyroiditis    Chronic, ongoing.  Continue current medication regimen and adjust as needed.  Thyroid labs today.  ATA guidelines recommend goal for age <6.        Relevant Medications   levothyroxine (SYNTHROID) 75 MCG tablet   Other Relevant Orders   T4, free   TSH     Musculoskeletal and Integument   Osteopenia of neck of left femur    Chronic, ongoing.  On 2017 DEXA and repeat in November 2022 (T-score -1.7).  Continue supplements at home and gentle weight bearing exercises.  Check Vit D, recent was stable. Continue Evista holiday.  Plan on recheck DEXA in November 2027.      Relevant Orders   VITAMIN D 25 Hydroxy (Vit-D Deficiency, Fractures)     Genitourinary   Stage 3 chronic kidney disease (HCC)    Chronic, stable with no decline noted on recent nephrology labs.  Continue collaboration with nephrology.  Taking ACE, may benefit  Farxiga in future for further kidney protection.  Labs up to date.  Renal dose medications as needed.        Hematopoietic and Hemostatic   Other thrombophilia (HCC)    Continues on Eliquis for atrial fibrillation.  Recent CBC with nephrology remains stable.  She is to notify provider if any increased bruising or skin breakdown.      Thrombocytopenia (HCC)    Noted on on past labs intermittently.  Continue to monitor.  CBC up to date.        Other   Gout    Chronic, stable with no recent flares.  Continue Allopurinol, but reduce to renal dosing 100 MG, and check uric acid level.      Relevant Orders   Uric acid   Hyperlipidemia    Chronic, ongoing.  Continue current medication regimen and adjust as needed based on labs.  Lipid panel today.      Relevant Medications   rosuvastatin (CRESTOR) 20 MG tablet   Other Relevant Orders   Lipid Panel w/o Chol/HDL Ratio   Obesity    BMI 35.95 with a-fib and HTN + CKD.  Recommended eating smaller high protein, low fat meals more frequently and exercising 30 mins a day 5 times a week with a goal of 10-15lb weight loss in the next 3 months. Patient voiced their understanding and motivation to adhere to these recommendations.        Vitamin B12 deficiency    Currently taking daily supplement. Will check B12 level and adjust regimen as needed.       Relevant Orders   Vitamin B12   Other Visit Diagnoses     Flu vaccine need       Flu vaccine today, educated on this.   Relevant Orders   Flu Vaccine Trivalent High Dose (Fluad) (Completed)  Follow up plan: Return in about 6 months (around 07/30/2023) for HTN/HLD, A-FIB, CKD, OSTEOPENIA.

## 2023-01-29 NOTE — Assessment & Plan Note (Signed)
Chronic, rate controlled.  Continue current medication regimen and collaboration with cardiology (Dr. Welton Flakes).  Recent notes and imaging reviewed in chart.

## 2023-01-29 NOTE — Addendum Note (Signed)
Addended by: Aura Dials T on: 01/29/2023 03:02 PM   Modules accepted: Level of Service

## 2023-01-29 NOTE — Assessment & Plan Note (Signed)
Currently taking daily supplement. Will check B12 level and adjust regimen as needed.

## 2023-01-29 NOTE — Assessment & Plan Note (Signed)
Chronic, ongoing.  Continue current medication regimen and adjust as needed based on labs.  Lipid panel today.

## 2023-01-29 NOTE — Assessment & Plan Note (Signed)
Chronic, ongoing.  Continue current medication regimen and adjust as needed.  Thyroid labs today.  ATA guidelines recommend goal for age <6.

## 2023-01-29 NOTE — Assessment & Plan Note (Signed)
Chronic, stable with no recent flares.  Continue Allopurinol, but reduce to renal dosing 100 MG, and check uric acid level.

## 2023-01-29 NOTE — Patient Instructions (Addendum)
Ms. Skaff , Thank you for taking time to come for your Medicare Wellness Visit. I appreciate your ongoing commitment to your health goals. Please review the following plan we discussed and let me know if I can assist you in the future.   Referrals/Orders/Follow-Ups/Clinician Recommendations: Considering getting the shingles vaccines and the covid booster.  This is a list of the screening recommended for you and due dates:  Health Maintenance  Topic Date Due   COVID-19 Vaccine (4 - 2023-24 season) 02/14/2023*   Zoster (Shingles) Vaccine (1 of 2) 05/01/2023*   DEXA scan (bone density measurement)  02/16/2023   Mammogram  12/10/2023   Medicare Annual Wellness Visit  01/29/2024   DTaP/Tdap/Td vaccine (3 - Td or Tdap) 06/20/2028   Pneumonia Vaccine  Completed   Flu Shot  Completed   Hepatitis C Screening  Completed   HPV Vaccine  Aged Out   Colon Cancer Screening  Discontinued  *Topic was postponed. The date shown is not the original due date.    Advanced directives: (In Chart) A copy of your advanced directives are scanned into your chart should your provider ever need it.  Next Medicare Annual Wellness Visit scheduled for next year: Yes, 02/04/24 @ 1:10pm
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
## 2023-01-30 LAB — LIPID PANEL W/O CHOL/HDL RATIO
Cholesterol, Total: 143 mg/dL (ref 100–199)
HDL: 89 mg/dL (ref >39)
LDL Chol Calc (NIH): 40 mg/dL (ref 0–99)
Triglycerides: 72 mg/dL (ref 0–149)
VLDL Cholesterol Cal: 14 mg/dL (ref 5–40)

## 2023-01-30 LAB — VITAMIN D 25 HYDROXY (VIT D DEFICIENCY, FRACTURES): Vit D, 25-Hydroxy: 42.5 ng/mL (ref 30.0–100.0)

## 2023-01-30 LAB — T4, FREE: Free T4: 1.64 ng/dL (ref 0.82–1.77)

## 2023-01-30 LAB — TSH: TSH: 1.66 u[IU]/mL (ref 0.450–4.500)

## 2023-01-30 LAB — URIC ACID: Uric Acid: 4.3 mg/dL (ref 3.1–7.9)

## 2023-01-30 LAB — VITAMIN B12: Vitamin B-12: 1764 pg/mL — ABNORMAL HIGH (ref 232–1245)

## 2023-01-30 NOTE — Progress Notes (Signed)
Good morning, please let Amy Myers know her labs have returned and overall remain stable with exception of B12 level which is a little too high.  Cut back on taking B12 supplement to 3 times a week and we will recheck next visit.  Any questions?  No other medication changes needed. Keep being amazing!!  Thank you for allowing me to participate in your care.  I appreciate you. Kindest regards, Vaibhav Fogleman

## 2023-02-05 ENCOUNTER — Other Ambulatory Visit: Payer: Self-pay | Admitting: Cardiovascular Disease

## 2023-02-08 ENCOUNTER — Other Ambulatory Visit: Payer: Self-pay | Admitting: Cardiovascular Disease

## 2023-02-08 DIAGNOSIS — I482 Chronic atrial fibrillation, unspecified: Secondary | ICD-10-CM

## 2023-02-12 DIAGNOSIS — Z85828 Personal history of other malignant neoplasm of skin: Secondary | ICD-10-CM | POA: Diagnosis not present

## 2023-02-12 DIAGNOSIS — L578 Other skin changes due to chronic exposure to nonionizing radiation: Secondary | ICD-10-CM | POA: Diagnosis not present

## 2023-03-06 ENCOUNTER — Other Ambulatory Visit: Payer: Self-pay | Admitting: Cardiovascular Disease

## 2023-03-06 DIAGNOSIS — I482 Chronic atrial fibrillation, unspecified: Secondary | ICD-10-CM

## 2023-03-19 ENCOUNTER — Encounter: Payer: Self-pay | Admitting: Cardiovascular Disease

## 2023-03-19 ENCOUNTER — Ambulatory Visit (INDEPENDENT_AMBULATORY_CARE_PROVIDER_SITE_OTHER): Payer: Medicare Other | Admitting: Cardiovascular Disease

## 2023-03-19 VITALS — BP 126/82 | HR 77 | Ht 59.0 in | Wt 177.8 lb

## 2023-03-19 DIAGNOSIS — I482 Chronic atrial fibrillation, unspecified: Secondary | ICD-10-CM | POA: Diagnosis not present

## 2023-03-19 DIAGNOSIS — R5383 Other fatigue: Secondary | ICD-10-CM | POA: Diagnosis not present

## 2023-03-19 DIAGNOSIS — E782 Mixed hyperlipidemia: Secondary | ICD-10-CM

## 2023-03-19 DIAGNOSIS — I259 Chronic ischemic heart disease, unspecified: Secondary | ICD-10-CM

## 2023-03-19 DIAGNOSIS — I700 Atherosclerosis of aorta: Secondary | ICD-10-CM

## 2023-03-19 DIAGNOSIS — R0789 Other chest pain: Secondary | ICD-10-CM

## 2023-03-19 DIAGNOSIS — I340 Nonrheumatic mitral (valve) insufficiency: Secondary | ICD-10-CM | POA: Diagnosis not present

## 2023-03-19 DIAGNOSIS — I10 Essential (primary) hypertension: Secondary | ICD-10-CM | POA: Diagnosis not present
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
## 2023-04-02 ENCOUNTER — Ambulatory Visit: Payer: Medicare Other

## 2023-04-02 ENCOUNTER — Other Ambulatory Visit: Payer: Self-pay | Admitting: Cardiology

## 2023-04-02 DIAGNOSIS — I10 Essential (primary) hypertension: Secondary | ICD-10-CM

## 2023-04-02 DIAGNOSIS — I482 Chronic atrial fibrillation, unspecified: Secondary | ICD-10-CM

## 2023-04-02 DIAGNOSIS — E782 Mixed hyperlipidemia: Secondary | ICD-10-CM | POA: Diagnosis not present

## 2023-04-02 DIAGNOSIS — N1832 Chronic kidney disease, stage 3b: Secondary | ICD-10-CM

## 2023-04-02 DIAGNOSIS — I259 Chronic ischemic heart disease, unspecified: Secondary | ICD-10-CM | POA: Diagnosis not present

## 2023-04-02 DIAGNOSIS — R0789 Other chest pain: Secondary | ICD-10-CM

## 2023-04-02 DIAGNOSIS — R072 Precordial pain: Secondary | ICD-10-CM

## 2023-04-02 DIAGNOSIS — I700 Atherosclerosis of aorta: Secondary | ICD-10-CM

## 2023-04-02 DIAGNOSIS — I340 Nonrheumatic mitral (valve) insufficiency: Secondary | ICD-10-CM

## 2023-04-02 DIAGNOSIS — R5383 Other fatigue: Secondary | ICD-10-CM

## 2023-04-02 MED ORDER — IOHEXOL 350 MG/ML SOLN
100.0000 mL | Freq: Once | INTRAVENOUS | Status: AC | PRN
  Administered 2023-04-02: 100 mL via INTRAVENOUS

## 2023-04-03 ENCOUNTER — Other Ambulatory Visit: Payer: Self-pay | Admitting: Cardiovascular Disease

## 2023-04-03 DIAGNOSIS — I482 Chronic atrial fibrillation, unspecified: Secondary | ICD-10-CM

## 2023-04-03 LAB — COMPREHENSIVE METABOLIC PANEL
ALT: 10 [IU]/L (ref 0–32)
AST: 15 [IU]/L (ref 0–40)
Albumin: 4.4 g/dL (ref 3.8–4.8)
Alkaline Phosphatase: 79 [IU]/L (ref 44–121)
BUN/Creatinine Ratio: 12 (ref 12–28)
BUN: 18 mg/dL (ref 8–27)
Bilirubin Total: 0.8 mg/dL (ref 0.0–1.2)
CO2: 23 mmol/L (ref 20–29)
Calcium: 10 mg/dL (ref 8.7–10.3)
Chloride: 100 mmol/L (ref 96–106)
Creatinine, Ser: 1.48 mg/dL — ABNORMAL HIGH (ref 0.57–1.00)
Globulin, Total: 2.2 g/dL (ref 1.5–4.5)
Glucose: 92 mg/dL (ref 70–99)
Potassium: 4.5 mmol/L (ref 3.5–5.2)
Sodium: 139 mmol/L (ref 134–144)
Total Protein: 6.6 g/dL (ref 6.0–8.5)
eGFR: 36 mL/min/{1.73_m2} — ABNORMAL LOW (ref >59)

## 2023-04-03 LAB — BUN PICCOLO, WAIVED: BUN Piccolo, Waived: 15 mg/dL (ref 7–22)

## 2023-04-03 LAB — CREATININE+EGFR PICCOLO,WAIVED: Creatinine Piccolo, Waived: 1.5 mg/dL — ABNORMAL HIGH (ref 0.6–1.2)

## 2023-04-05 ENCOUNTER — Encounter: Payer: Self-pay | Admitting: Cardiovascular Disease

## 2023-04-05 ENCOUNTER — Ambulatory Visit (INDEPENDENT_AMBULATORY_CARE_PROVIDER_SITE_OTHER): Payer: Medicare Other | Admitting: Cardiovascular Disease

## 2023-04-05 VITALS — BP 118/70 | HR 101 | Ht 59.0 in | Wt 175.2 lb

## 2023-04-05 DIAGNOSIS — I2510 Atherosclerotic heart disease of native coronary artery without angina pectoris: Secondary | ICD-10-CM

## 2023-04-05 DIAGNOSIS — N1832 Chronic kidney disease, stage 3b: Secondary | ICD-10-CM

## 2023-04-05 DIAGNOSIS — I482 Chronic atrial fibrillation, unspecified: Secondary | ICD-10-CM

## 2023-04-05 DIAGNOSIS — I700 Atherosclerosis of aorta: Secondary | ICD-10-CM

## 2023-04-05 DIAGNOSIS — I340 Nonrheumatic mitral (valve) insufficiency: Secondary | ICD-10-CM | POA: Diagnosis not present

## 2023-04-05 DIAGNOSIS — E782 Mixed hyperlipidemia: Secondary | ICD-10-CM

## 2023-04-05 DIAGNOSIS — I10 Essential (primary) hypertension: Secondary | ICD-10-CM | POA: Diagnosis not present
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
## 2023-04-29 DIAGNOSIS — I129 Hypertensive chronic kidney disease with stage 1 through stage 4 chronic kidney disease, or unspecified chronic kidney disease: Secondary | ICD-10-CM | POA: Diagnosis not present

## 2023-04-29 DIAGNOSIS — I10 Essential (primary) hypertension: Secondary | ICD-10-CM | POA: Diagnosis not present

## 2023-04-29 DIAGNOSIS — N1832 Chronic kidney disease, stage 3b: Secondary | ICD-10-CM | POA: Diagnosis not present

## 2023-05-06 ENCOUNTER — Other Ambulatory Visit: Payer: Self-pay | Admitting: Cardiovascular Disease

## 2023-05-06 DIAGNOSIS — I482 Chronic atrial fibrillation, unspecified: Secondary | ICD-10-CM

## 2023-05-07 DIAGNOSIS — N1832 Chronic kidney disease, stage 3b: Secondary | ICD-10-CM | POA: Diagnosis not present

## 2023-05-07 DIAGNOSIS — I129 Hypertensive chronic kidney disease with stage 1 through stage 4 chronic kidney disease, or unspecified chronic kidney disease: Secondary | ICD-10-CM | POA: Diagnosis not present

## 2023-05-07 DIAGNOSIS — I10 Essential (primary) hypertension: Secondary | ICD-10-CM | POA: Diagnosis not present

## 2023-05-13 DIAGNOSIS — H35363 Drusen (degenerative) of macula, bilateral: Secondary | ICD-10-CM | POA: Diagnosis not present

## 2023-05-13 DIAGNOSIS — H5340 Unspecified visual field defects: Secondary | ICD-10-CM | POA: Diagnosis not present

## 2023-05-13 DIAGNOSIS — H40023 Open angle with borderline findings, high risk, bilateral: Secondary | ICD-10-CM | POA: Diagnosis not present

## 2023-05-13 DIAGNOSIS — H25013 Cortical age-related cataract, bilateral: Secondary | ICD-10-CM | POA: Diagnosis not present

## 2023-06-10 ENCOUNTER — Other Ambulatory Visit: Payer: Self-pay | Admitting: Cardiovascular Disease

## 2023-06-10 DIAGNOSIS — I482 Chronic atrial fibrillation, unspecified: Secondary | ICD-10-CM

## 2023-07-04 ENCOUNTER — Encounter: Payer: Self-pay | Admitting: Cardiovascular Disease

## 2023-07-04 ENCOUNTER — Ambulatory Visit (INDEPENDENT_AMBULATORY_CARE_PROVIDER_SITE_OTHER): Payer: Medicare Other | Admitting: Cardiovascular Disease

## 2023-07-04 VITALS — BP 118/71 | HR 90 | Ht 59.0 in | Wt 174.0 lb

## 2023-07-04 DIAGNOSIS — I700 Atherosclerosis of aorta: Secondary | ICD-10-CM | POA: Diagnosis not present

## 2023-07-04 DIAGNOSIS — I482 Chronic atrial fibrillation, unspecified: Secondary | ICD-10-CM

## 2023-07-04 DIAGNOSIS — R0602 Shortness of breath: Secondary | ICD-10-CM

## 2023-07-04 DIAGNOSIS — E782 Mixed hyperlipidemia: Secondary | ICD-10-CM | POA: Diagnosis not present

## 2023-07-04 DIAGNOSIS — I10 Essential (primary) hypertension: Secondary | ICD-10-CM

## 2023-07-04 DIAGNOSIS — I340 Nonrheumatic mitral (valve) insufficiency: Secondary | ICD-10-CM
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
## 2023-07-08 ENCOUNTER — Other Ambulatory Visit: Payer: Self-pay | Admitting: Cardiology

## 2023-07-08 DIAGNOSIS — I482 Chronic atrial fibrillation, unspecified: Secondary | ICD-10-CM

## 2023-07-12 ENCOUNTER — Other Ambulatory Visit: Payer: Self-pay | Admitting: Cardiovascular Disease

## 2023-07-12 ENCOUNTER — Telehealth: Payer: Self-pay | Admitting: Cardiovascular Disease

## 2023-07-12 DIAGNOSIS — I482 Chronic atrial fibrillation, unspecified: Secondary | ICD-10-CM

## 2023-07-12 NOTE — Telephone Encounter (Signed)
 PT called wanting to get her [facility] 5mg  sent to [facility] in [place]

## 2023-07-13 ENCOUNTER — Other Ambulatory Visit: Payer: Self-pay | Admitting: Cardiology

## 2023-07-13 DIAGNOSIS — I482 Chronic atrial fibrillation, unspecified: Secondary | ICD-10-CM

## 2023-07-15 ENCOUNTER — Other Ambulatory Visit: Payer: Self-pay

## 2023-07-15 DIAGNOSIS — I482 Chronic atrial fibrillation, unspecified: Secondary | ICD-10-CM

## 2023-07-15 MED ORDER — APIXABAN 5 MG PO TABS: 5.0000 mg | ORAL_TABLET | Freq: Two times a day (BID) | ORAL | 2 refills | Status: DC

## 2023-07-15 NOTE — Telephone Encounter (Signed)
Rx was sent with 2 refills

## 2023-07-27 NOTE — Patient Instructions (Signed)
 Chronic Kidney Disease: Eating Plan Chronic kidney disease (CKD) is when your kidneys aren't working well. They can't remove waste, fluids, and other substances from your blood. When these substances build up, they can worsen kidney damage and affect your health. Eating certain foods can lead to a buildup of these substances. Changing your diet can help prevent more kidney damage. Diet changes may also delay dialysis or even keep you from needing it. What nutrients should I limit? Work with your health care team and an expert in healthy eating (dietitian) to make a meal plan that's right for you. Foods you can eat and foods you should limit or avoid will depend on: The stage of your kidney disease. Any other conditions you have. The items listed below are not a complete list. Talk with a dietitian to learn what's best for you. Potassium Potassium affects how well your heart beats. Too much potassium in your blood can cause an irregular heartbeat or even a heart attack. You may need to limit foods that are high in potassium, such as: Liquid milk and soy milk. Salt substitutes that contain potassium. Fruits like: Bananas. Apricots. Melon. Prunes and raisins. Kiwi. Nectarines and oranges. Vegetables, such as: Potatoes, sweet potatoes, and yams. Tomatoes. Leafy greens. Beets. Avocado. Pumpkin and winter squash. Beans, like lima beans. Nuts. Phosphorus Phosphorus is a mineral found in your bones. You need a balance between calcium and phosphorus to build and maintain healthy bones.  Too much added phosphorus from the foods you eat can pull calcium from your bones. Losing calcium can make your bones weak and more likely to break. Too much phosphorus can also make your skin itch. You may need to limit foods that are high in phosphorus or that have added phosphorus, such as: Liquid milk and dairy products. Dark-colored sodas or soft drinks. Bran cereals and oatmeal. Protein  Protein  helps your body make and keep muscle. Protein also helps to repair your body's cells and tissues.  One of the natural breakdown products of protein is a waste product called urea. When your kidneys aren't working well, they can't remove waste like urea. Reducing protein in your diet can help keep urea from building up in your blood. Depending on your stage of kidney disease, you may need to eat smaller portions of foods that are high in protein. Sources of animal protein include: Meat (all types). Fish and seafood. Poultry. Eggs. Dairy. Other protein foods include: Beans and legumes. Nuts and nut butter. Soy, like tofu.  Sodium Salt (sodium) helps to keep a healthy balance of fluids in your body. Too much salt can increase your blood pressure, which can harm your heart and lungs. Extra salt can also cause your body to keep too much fluid, making your kidneys work harder. You may need to limit or avoid foods that are high in salt, such as: Salt seasonings. Soy and teriyaki sauce. Meats that are: Packaged. Precooked. Cured. Processed. Salted crackers and snack foods. Fast food. Canned soups and foods. Pickled foods. Boxed mixes or ready-to-eat boxed meals and side dishes. Bottled dressings, sauces, and marinades. Talk with your dietitian about how much potassium, phosphorus, protein, and salt you may have each day. What are tips for following this plan? Reading food labels  Check the amount of salt in foods. Limit foods that have salt listed among the first five ingredients. Try to eat low-salt foods. Check the ingredient list for added phosphorus or potassium. "Phos" in an ingredient is a sign that  phosphorus has been added. Do not buy foods that are calcium-enriched or that have calcium added to them (fortified). Buy canned vegetables and beans that say "no salt added." Rinse them before eating. Lifestyle Limit the amount of protein you eat from animal sources each day. Focus on  protein from plant sources, like tofu and dried beans, peas, and lentils. Do not add salt to food when cooking or before eating. Do not eat star fruit. It can be toxic for people with kidney problems. Talk with your health care provider before taking any vitamin or mineral supplements. If told by your provider: Track how much liquid you have so you can avoid drinking too much. Try to eat foods that are made mostly from water, like gelatin, ice cream, soups, and juicy fruits and vegetables. If you have diabetes and chronic kidney disease: If you have diabetes and CKD, you need to keep your blood sugar (glucose) in the target range recommended by your provider. Follow your diabetes management plan. This may include: Checking your blood glucose regularly. Taking medicines by mouth, or taking insulin, or both. Exercising for at least 30 minutes on 5 or more days each week, or as told by your provider. Tracking how many servings of carbohydrates you eat at each meal. Not using orange juice to treat low blood sugars. Instead, use apple juice, cranberry juice, or clear soda. You may be given guidelines on what foods and nutrients you may eat, and how much you can have each day. This depends on your stage of kidney disease and whether you have high blood pressure. Follow the meal plan your dietitian gives you. Where to find more information [employer] and Digestive and Kidney Diseases: [employer].si National Kidney Foundation: kidney.org This information is not intended to replace advice given to you by your health care provider. Make sure you discuss any questions you have with your health care provider. Document Revised: 11/13/2022 Document Reviewed: 11/13/2022 Elsevier Patient Education  2024 [employer].

## 2023-07-30 ENCOUNTER — Encounter: Payer: Self-pay | Admitting: Nurse Practitioner

## 2023-07-30 ENCOUNTER — Ambulatory Visit (INDEPENDENT_AMBULATORY_CARE_PROVIDER_SITE_OTHER): Payer: Self-pay | Admitting: Nurse Practitioner

## 2023-07-30 VITALS — BP 111/63 | HR 97 | Temp 98.1°F | Ht 59.0 in | Wt 173.6 lb

## 2023-07-30 DIAGNOSIS — Z7901 Long term (current) use of anticoagulants: Secondary | ICD-10-CM | POA: Diagnosis not present

## 2023-07-30 DIAGNOSIS — E66811 Obesity, class 1: Secondary | ICD-10-CM

## 2023-07-30 DIAGNOSIS — D6869 Other thrombophilia: Secondary | ICD-10-CM

## 2023-07-30 DIAGNOSIS — D696 Thrombocytopenia, unspecified: Secondary | ICD-10-CM | POA: Diagnosis not present

## 2023-07-30 DIAGNOSIS — N1832 Chronic kidney disease, stage 3b: Secondary | ICD-10-CM

## 2023-07-30 DIAGNOSIS — I10 Essential (primary) hypertension: Secondary | ICD-10-CM | POA: Diagnosis not present

## 2023-07-30 DIAGNOSIS — M25551 Pain in right hip: Secondary | ICD-10-CM | POA: Diagnosis not present

## 2023-07-30 DIAGNOSIS — I700 Atherosclerosis of aorta: Secondary | ICD-10-CM

## 2023-07-30 DIAGNOSIS — E063 Autoimmune thyroiditis: Secondary | ICD-10-CM

## 2023-07-30 DIAGNOSIS — G8929 Other chronic pain: Secondary | ICD-10-CM | POA: Diagnosis not present

## 2023-07-30 DIAGNOSIS — E782 Mixed hyperlipidemia: Secondary | ICD-10-CM | POA: Diagnosis not present

## 2023-07-30 DIAGNOSIS — I482 Chronic atrial fibrillation, unspecified: Secondary | ICD-10-CM

## 2023-07-30 NOTE — Assessment & Plan Note (Signed)
Chronic.  Noted on CT imaging 10/04/17.  Educated patient, continue statin and Eliquis daily.  Recommend focus on healthy diet and regular activity. 

## 2023-07-30 NOTE — Assessment & Plan Note (Signed)
 BMI 35.06 with a-fib and HTN + CKD.  Recommended eating smaller high protein, low fat meals more frequently and exercising 30 mins a day 5 times a week with a goal of 10-15lb weight loss in the next 3 months. Patient voiced their understanding and motivation to adhere to these recommendations.

## 2023-07-30 NOTE — Progress Notes (Signed)
 BP 111/63   Pulse 97   Temp 98.1 F (36.7 C) (Oral)   Ht 4\' 11"  (1.499 m)   Wt 173 lb 9.6 oz (78.7 kg)   LMP  (LMP Unknown)   SpO2 98%   BMI 35.06 kg/m    Subjective:    Patient ID: Amy Myers, female    DOB: [birth year], 78 y.o.   MRN: [id number]  HPI: Amy Myers is a 78 y.o. female  Chief Complaint  Patient presents with   Atrial Fibrillation   Chronic Kidney Disease   Hyperlipidemia   Hypertension   Osteopenia   HYPERTENSION / HYPERLIPIDEMIA  Taking Metoprolol, Rosuvastatin, Lisinopril + Eliquis. Echo August 2024 per with Dr. Welton Flakes -- EF 76.8%, last visit was 07/04/23 with no changes.  History of aortic atherosclerosis noted on past imaging. Satisfied with current treatment? yes Duration of hypertension: chronic BP monitoring frequency: not checking BP range:  BP medication side effects: no Duration of hyperlipidemia: chronic Cholesterol medication side effects: no Cholesterol supplements: none Medication compliance: good compliance Aspirin: no Recent stressors: no Recurrent headaches: no Visual changes: no Palpitations: no Dyspnea: no Chest pain: no Lower extremity edema: no Dizzy/lightheaded: no  The 10-year ASCVD risk score (Arnett DK, et al., 2019) is: 23.6%   Values used to calculate the score:     Age: 65 years     Sex: Female     Is Non-Hispanic African American: No     Diabetic: No     Tobacco smoker: No     Systolic Blood Pressure: 111 mmHg     Is BP treated: Yes     HDL Cholesterol: 89 mg/dL     Total Cholesterol: 143 mg/dL  ATRIAL FIBRILLATION Atrial fibrillation status: stable Satisfied with current treatment: yes  Medication side effects:  no Medication compliance: good compliance Etiology of atrial fibrillation: unknown Palpitations:  no Chest pain:  no Dyspnea on exertion:  no Orthopnea:  no Syncope:  no Edema:  no Ventricular rate control: B-blocker Anti-coagulation: long acting   HIP PAIN (RIGHT) Has been having OA  pain to right hip off and on for long while with some worsening.  Notices if sits too long or walks to long.  Notices most when trying to get up and down.  Underlying osteopenia. Duration: chronic Involved hip: right  Mechanism of injury: unknown Location: posterior Onset: gradual  Severity: 5/10  Quality: dull, aching, and throbbing Frequency: intermittent Radiation: no Aggravating factors:  walking for long period and prolonged sitting   Alleviating factors: APAP and rest  Status: fluctuating Treatments attempted: Tylenol   Relief with NSAIDs?: No NSAIDs Taken Weakness with weight bearing: no Weakness with walking: no Paresthesias / decreased sensation: no Swelling: no Redness:no Fevers: no    CHRONIC KIDNEY DISEASE Last visit with nephrology 05/07/23: eGFR 37, CRT 1.45, PLT 136.  They did recommend Farxiga, which she is concerned about side effects with.  CKD status: stable Medications renally dose: yes Previous renal evaluation: yes Pneumovax:  Up to Date Influenza Vaccine:  Up to Date   HYPOTHYROIDISM Taking Levothyroxine 75 MCG.   Thyroid control status:stable Satisfied with current treatment? yes Medication side effects: no Medication compliance: good compliance Etiology of hypothyroidism:  Recent dose adjustment:no Fatigue: no Cold intolerance: no Heat intolerance: no Weight gain: no Weight loss: no Constipation: occasional Diarrhea/loose stools: occasional Palpitations: no Lower extremity edema: no Anxiety/depressed mood: no     01/29/2023    3:05 PM 01/29/2023    9:19  AM 08/13/2022   10:54 AM 03/14/2022    8:39 AM 01/31/2022   10:02 AM  Depression screen PHQ 2/9  Decreased Interest 0 0 0 0 0  Down, Depressed, Hopeless 0 0 0 0 0  PHQ - 2 Score 0 0 0 0 0  Altered sleeping 0 0 0 0 0  Tired, decreased energy 0 0 0 0 0  Change in appetite 0 0 0 0 0  Feeling bad or failure about yourself  0 0 0 0 0  Trouble concentrating 0 0 0 0 0  Moving slowly or  fidgety/restless 0 0 0 0 0  Suicidal thoughts 0 0 0 0 0  PHQ-9 Score 0 0 0 0 0  Difficult doing work/chores Not difficult at all Not difficult at all Not difficult at all Not difficult at all Not difficult at all       01/29/2023    9:19 AM 08/13/2022   10:55 AM 01/31/2022   10:02 AM 08/04/2021    9:53 AM  GAD 7 : Generalized Anxiety Score  Nervous, Anxious, on Edge 0 0 0 0  Control/stop worrying 0 0 0 0  Worry too much - different things 0 0 0 0  Trouble relaxing 0 0 0 0  Restless 0 0 0 0  Easily annoyed or irritable 0 0 0 0  Afraid - awful might happen 0 0 0 0  Total GAD 7 Score 0 0 0 0  Anxiety Difficulty Not difficult at all Not difficult at all Not difficult at all Not difficult at all       Relevant past medical, surgical, family and social history reviewed and updated as indicated. Interim medical history since our last visit reviewed. Allergies and medications reviewed and updated.  Review of Systems  Constitutional:  Negative for activity change, appetite change, diaphoresis, fatigue and unexpected weight change.  Respiratory:  Negative for cough, chest tightness, shortness of breath and wheezing.   Cardiovascular:  Negative for chest pain, palpitations and leg swelling.  Endocrine: Negative for cold intolerance and heat intolerance.  Musculoskeletal:  Positive for arthralgias.  Neurological: Negative.   Psychiatric/Behavioral: Negative.      Per HPI unless specifically indicated above     Objective:    BP 111/63   Pulse 97   Temp 98.1 F (36.7 C) (Oral)   Ht 4\' 11"  (1.499 m)   Wt 173 lb 9.6 oz (78.7 kg)   LMP  (LMP Unknown)   SpO2 98%   BMI 35.06 kg/m   Wt Readings from Last 3 Encounters:  07/30/23 173 lb 9.6 oz (78.7 kg)  07/04/23 174 lb (78.9 kg)  04/05/23 175 lb 3.2 oz (79.5 kg)    Physical Exam Vitals and nursing note reviewed.  Constitutional:      General: She is awake. She is not in acute distress.    Appearance: She is well-developed and  well-groomed. She is obese. She is not ill-appearing or toxic-appearing.     Comments: Using cane per baseline.  HENT:     Head: Normocephalic.     Right Ear: Hearing normal.     Left Ear: Hearing normal.  Eyes:     General: Lids are normal.        Right eye: No discharge.        Left eye: No discharge.     Conjunctiva/sclera: Conjunctivae normal.     Pupils: Pupils are equal, round, and reactive to light.  Neck:     Thyroid:  No thyromegaly.     Vascular: No carotid bruit.  Cardiovascular:     Rate and Rhythm: Normal rate. Rhythm irregularly irregular.     Heart sounds: Murmur heard.     Systolic murmur is present with a grade of 2/6.     No gallop.  Pulmonary:     Effort: Pulmonary effort is normal. No accessory muscle usage or respiratory distress.     Breath sounds: Normal breath sounds.  Abdominal:     General: Bowel sounds are normal.     Palpations: Abdomen is soft.  Musculoskeletal:     Cervical back: Normal range of motion and neck supple.     Right hip: Tenderness (posterior aspect.) present. No deformity, bony tenderness or crepitus. Normal range of motion. Normal strength.     Left hip: Normal.     Right lower leg: No edema.     Left lower leg: No edema.     Comments: Some antalgic gait upon getting up from chair, improved once walking.  Skin:    General: Skin is warm and dry.  Neurological:     Mental Status: She is alert and oriented to person, place, and time.  Psychiatric:        Attention and Perception: Attention normal.        Mood and Affect: Mood normal.        Behavior: Behavior normal. Behavior is cooperative.        Thought Content: Thought content normal.        Judgment: Judgment normal.    Results for orders placed or performed in visit on 04/02/23  Creatinine+eGFR Piccolo,Waived   Collection Time: 04/02/23 11:35 AM  Result Value Ref Range   Creatinine Piccolo, Waived 1.5 (H) 0.6 - 1.2 mg/dL  BUN Piccolo, Waived   Collection Time: 04/02/23  11:35 AM  Result Value Ref Range   BUN Piccolo, Waived 15 7 - 22 mg/dL      Assessment & Plan:   Problem List Items Addressed This Visit       Cardiovascular and Mediastinum   Aortic atherosclerosis (HCC)   Chronic.  Noted on CT imaging 10/04/17.  Educated patient, continue statin and Eliquis daily.  Recommend focus on healthy diet and regular activity.      Chronic atrial fibrillation (HCC)   Chronic, rate controlled.  Continue current medication regimen and collaboration with cardiology (Dr. Meredeth Stallion).  Recent notes and imaging reviewed in chart.      Hypertension   Chronic, stable.  BP at goal at in office.  Followed by cardiology and nephrology.  Continue current medication regimen and adjust as needed.  Continue collaboration with cardiologist and nephrologist.  LABS: up to date with nephrology.  Recommend she continue to monitor BP at home a few days a week and document + focus on DASH diet.  Return in 6 months.        Endocrine   Hashimoto's thyroiditis   Chronic, ongoing.  Continue current medication regimen and adjust as needed.  Thyroid labs next visit.  ATA guidelines recommend goal for age <6.          Genitourinary   Stage 3 chronic kidney disease (HCC) - Primary   Chronic, ongoing.  Continue collaboration with nephrology.  Taking ACE, may benefit Farxiga in future for further kidney protection but concern about affordability.  Appears to be Tier 5 and she is on tight budget.  She will think about this and further discuss next visit.  Could  look into if any assistance with this.  Labs up to date.  Renal dose medications as needed.        Hematopoietic and Hemostatic   Other thrombophilia (HCC)   Continues on Eliquis for atrial fibrillation.  Recent CBC with nephrology remains stable.  She is to notify provider if any increased bruising or skin breakdown.      Thrombocytopenia (HCC)   Ongoing.  Noted on on past labs intermittently.  Continue to monitor.  CBC up to  date.        Other   Chronic right hip pain   Ongoing, intermittent.  She declines imaging.  Suspect OA.  Recommend continue use of Tylenol as needed.  May use OTC Icy/Hot Lidocaine patches or Voltaren gel.  Avoid use of Ibuprofen products.  Continue use of heat as needed. Consider PT or imaging if worsening or ongoing.  Return to office if worsening symptoms.      Hyperlipidemia   Chronic, ongoing.  Continue current medication regimen and adjust as needed based on labs.  Lipid panel today.      Relevant Orders   Lipid Panel w/o Chol/HDL Ratio   Long term (current) use of anticoagulants   Obesity   BMI 35.06 with a-fib and HTN + CKD.  Recommended eating smaller high protein, low fat meals more frequently and exercising 30 mins a day 5 times a week with a goal of 10-15lb weight loss in the next 3 months. Patient voiced their understanding and motivation to adhere to these recommendations.          Follow up plan: Return in about 6 months (around 01/29/2024) for HTN/HLD, Thyroid, A-Fib, CKD -- needs annual labs this visit.

## 2023-07-30 NOTE — Assessment & Plan Note (Signed)
Continues on Eliquis for atrial fibrillation.  Recent CBC with nephrology remains stable.  She is to notify provider if any increased bruising or skin breakdown.

## 2023-07-30 NOTE — Assessment & Plan Note (Signed)
Ongoing, intermittent.  She declines imaging.  Suspect OA.  Recommend continue use of Tylenol as needed.  May use OTC Icy/Hot Lidocaine patches or Voltaren gel.  Avoid use of Ibuprofen products.  Continue use of heat as needed. Consider PT or imaging if worsening or ongoing.  Return to office if worsening symptoms.

## 2023-07-30 NOTE — Assessment & Plan Note (Signed)
 Ongoing.  Noted on on past labs intermittently.  Continue to monitor.  CBC up to date.

## 2023-07-30 NOTE — Assessment & Plan Note (Signed)
Chronic, ongoing.  Continue current medication regimen and adjust as needed based on labs. Lipid panel today. 

## 2023-07-30 NOTE — Assessment & Plan Note (Signed)
 Chronic, stable.  BP at goal at in office.  Followed by cardiology and nephrology.  Continue current medication regimen and adjust as needed.  Continue collaboration with cardiologist and nephrologist.  LABS: up to date with nephrology.  Recommend she continue to monitor BP at home a few days a week and document + focus on DASH diet.  Return in 6 months.

## 2023-07-30 NOTE — Assessment & Plan Note (Signed)
 Chronic, ongoing.  Continue collaboration with nephrology.  Taking ACE, may benefit Farxiga in future for further kidney protection but concern about affordability.  Appears to be Tier 5 and she is on tight budget.  She will think about this and further discuss next visit.  Could look into if any assistance with this.  Labs up to date.  Renal dose medications as needed.

## 2023-07-30 NOTE — Assessment & Plan Note (Signed)
Chronic, rate controlled.  Continue current medication regimen and collaboration with cardiology (Dr. Welton Flakes).  Recent notes and imaging reviewed in chart.

## 2023-07-30 NOTE — Assessment & Plan Note (Signed)
 Chronic, ongoing.  Continue current medication regimen and adjust as needed.  Thyroid labs next visit.  ATA guidelines recommend goal for age <6.

## 2023-07-31 LAB — LIPID PANEL W/O CHOL/HDL RATIO
Cholesterol, Total: 137 mg/dL (ref 100–199)
HDL: 85 mg/dL (ref >39)
LDL Chol Calc (NIH): 40 mg/dL (ref 0–99)
Triglycerides: 58 mg/dL (ref 0–149)
VLDL Cholesterol Cal: 12 mg/dL (ref 5–40)

## 2023-07-31 NOTE — Progress Notes (Signed)
 Good afternoon, please let Amy Myers know her lipid panel has returned and overall is normal.  Continue current statin therapy.

## 2023-08-06 ENCOUNTER — Other Ambulatory Visit: Payer: Self-pay | Admitting: Cardiovascular Disease

## 2023-10-04 ENCOUNTER — Ambulatory Visit: Admitting: Cardiovascular Disease

## 2023-10-04 ENCOUNTER — Encounter: Payer: Self-pay | Admitting: Cardiovascular Disease

## 2023-10-04 VITALS — BP 125/78 | HR 85 | Ht 59.0 in | Wt 166.0 lb

## 2023-10-04 DIAGNOSIS — I340 Nonrheumatic mitral (valve) insufficiency: Secondary | ICD-10-CM | POA: Diagnosis not present

## 2023-10-04 DIAGNOSIS — I700 Atherosclerosis of aorta: Secondary | ICD-10-CM | POA: Diagnosis not present

## 2023-10-04 DIAGNOSIS — R0602 Shortness of breath: Secondary | ICD-10-CM

## 2023-10-04 DIAGNOSIS — I482 Chronic atrial fibrillation, unspecified: Secondary | ICD-10-CM | POA: Diagnosis not present

## 2023-10-04 DIAGNOSIS — E782 Mixed hyperlipidemia: Secondary | ICD-10-CM | POA: Diagnosis not present

## 2023-10-04 DIAGNOSIS — I10 Essential (primary) hypertension: Secondary | ICD-10-CM

## 2023-10-04 MED ORDER — METOPROLOL SUCCINATE ER 100 MG PO TB24: 100.0000 mg | ORAL_TABLET | Freq: Every day | ORAL | 0 refills | Status: DC
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
## 2023-10-28 DIAGNOSIS — I129 Hypertensive chronic kidney disease with stage 1 through stage 4 chronic kidney disease, or unspecified chronic kidney disease: Secondary | ICD-10-CM | POA: Diagnosis not present

## 2023-10-28 DIAGNOSIS — N1832 Chronic kidney disease, stage 3b: Secondary | ICD-10-CM | POA: Diagnosis not present

## 2023-10-28 DIAGNOSIS — I10 Essential (primary) hypertension: Secondary | ICD-10-CM | POA: Diagnosis not present

## 2023-11-04 ENCOUNTER — Other Ambulatory Visit: Payer: Self-pay | Admitting: Cardiovascular Disease

## 2023-11-04 DIAGNOSIS — I482 Chronic atrial fibrillation, unspecified: Secondary | ICD-10-CM

## 2023-11-05 ENCOUNTER — Other Ambulatory Visit: Payer: Self-pay | Admitting: Nurse Practitioner

## 2023-11-05 DIAGNOSIS — Z1231 Encounter for screening mammogram for malignant neoplasm of breast: Secondary | ICD-10-CM

## 2023-11-05 DIAGNOSIS — N1832 Chronic kidney disease, stage 3b: Secondary | ICD-10-CM | POA: Diagnosis not present

## 2023-11-05 DIAGNOSIS — I129 Hypertensive chronic kidney disease with stage 1 through stage 4 chronic kidney disease, or unspecified chronic kidney disease: Secondary | ICD-10-CM | POA: Diagnosis not present

## 2023-11-11 DIAGNOSIS — H40023 Open angle with borderline findings, high risk, bilateral: Secondary | ICD-10-CM | POA: Diagnosis not present

## 2023-11-11 DIAGNOSIS — H25013 Cortical age-related cataract, bilateral: Secondary | ICD-10-CM | POA: Diagnosis not present

## 2023-11-11 DIAGNOSIS — H5340 Unspecified visual field defects: Secondary | ICD-10-CM | POA: Diagnosis not present

## 2023-11-11 DIAGNOSIS — H35363 Drusen (degenerative) of macula, bilateral: Secondary | ICD-10-CM | POA: Diagnosis not present

## 2023-11-20 ENCOUNTER — Other Ambulatory Visit: Payer: Self-pay

## 2023-11-20 DIAGNOSIS — I340 Nonrheumatic mitral (valve) insufficiency: Secondary | ICD-10-CM

## 2023-11-20 DIAGNOSIS — I482 Chronic atrial fibrillation, unspecified: Secondary | ICD-10-CM

## 2023-11-20 DIAGNOSIS — I2510 Atherosclerotic heart disease of native coronary artery without angina pectoris: Secondary | ICD-10-CM

## 2023-11-20 DIAGNOSIS — I10 Essential (primary) hypertension: Secondary | ICD-10-CM

## 2023-11-20 DIAGNOSIS — E782 Mixed hyperlipidemia: Secondary | ICD-10-CM

## 2023-11-20 MED ORDER — LISINOPRIL 2.5 MG PO TABS
2.5000 mg | ORAL_TABLET | Freq: Every day | ORAL | 11 refills | Status: AC
Start: 2023-11-20 — End: 2024-11-19

## 2023-12-11 ENCOUNTER — Ambulatory Visit
Admission: RE | Admit: 2023-12-11 | Discharge: 2023-12-11 | Disposition: A | Discharge status: Home / Self Care | Source: Ambulatory Visit | Attending: Nurse Practitioner | Admitting: Nurse Practitioner

## 2023-12-11 ENCOUNTER — Other Ambulatory Visit: Payer: Self-pay | Admitting: Cardiovascular Disease

## 2023-12-11 DIAGNOSIS — I482 Chronic atrial fibrillation, unspecified: Secondary | ICD-10-CM

## 2023-12-11 DIAGNOSIS — Z1231 Encounter for screening mammogram for malignant neoplasm of breast: Secondary | ICD-10-CM | POA: Insufficient documentation

## 2023-12-17 ENCOUNTER — Ambulatory Visit: Payer: Self-pay | Admitting: Nurse Practitioner

## 2023-12-17 ENCOUNTER — Other Ambulatory Visit: Payer: Self-pay | Admitting: Nurse Practitioner

## 2023-12-17 DIAGNOSIS — R928 Other abnormal and inconclusive findings on diagnostic imaging of breast: Secondary | ICD-10-CM

## 2023-12-17 NOTE — Progress Notes (Signed)
 Please let Amy Myers know her mammogram did return and shows she is in need of further imaging of the right breast.  Please reach out to [facility] to schedule this. Have a great day!!

## 2023-12-19 ENCOUNTER — Other Ambulatory Visit: Payer: Self-pay | Admitting: Nurse Practitioner

## 2023-12-19 NOTE — Telephone Encounter (Signed)
 Requested medication (s) are due for refill today: -no print  Requested medication (s) are on the active medication list: yes  Last refill:  01/29/23  Future visit scheduled: no  Notes to clinic:  overdue labs   Requested Prescriptions  Pending Prescriptions Disp Refills   allopurinol  (ZYLOPRIM ) 100 MG tablet [Pharmacy Med Name: ALLOPURINOL  100 MG TABLET] 180 tablet 0    Sig: Take 1 tablet (100 mg total) by mouth 2 (two) times daily.     Endocrinology:  Gout Agents - allopurinol  Failed - 12/19/2023  3:31 PM      Failed - Cr in normal range and within 360 days    Creatinine, Ser  Date Value Ref Range Status  04/02/2023 1.48 (H) 0.57 - 1.00 mg/dL Final   Creatinine Piccolo, Waived  Date Value Ref Range Status  04/02/2023 1.5 (H) 0.6 - 1.2 mg/dL Final         Failed - CBC within normal limits and completed in the last 12 months    WBC  Date Value Ref Range Status  01/31/2022 5.8 3.4 - 10.8 x10E3/uL Final  10/04/2017 10.7 3.6 - 11.0 K/uL Final   RBC  Date Value Ref Range Status  01/31/2022 4.48 3.77 - 5.28 x10E6/uL Final  10/04/2017 4.50 3.80 - 5.20 MIL/uL Final   Hemoglobin  Date Value Ref Range Status  01/31/2022 13.1 11.1 - 15.9 g/dL Final   Hematocrit  Date Value Ref Range Status  01/31/2022 41.2 34.0 - 46.6 % Final   MCHC  Date Value Ref Range Status  01/31/2022 31.8 31.5 - 35.7 g/dL Final  93/78/7980 66.9 32.0 - 36.0 g/dL Final   [facility]  Date Value Ref Range Status  01/31/2022 29.2 26.6 - 33.0 pg Final  10/04/2017 30.4 26.0 - 34.0 pg Final   MCV  Date Value Ref Range Status  01/31/2022 92 79 - 97 fL Final   No results found for: PLTCOUNTKUC, LABPLAT, POCPLA RDW  Date Value Ref Range Status  01/31/2022 13.5 11.7 - 15.4 % Final         Passed - Uric Acid in normal range and within 360 days    Uric Acid  Date Value Ref Range Status  01/29/2023 4.3 3.1 - 7.9 mg/dL Final    Comment:               Therapeutic target for gout patients: <6.0          Passed - Valid encounter within last 12 months    Recent Outpatient Visits           4 months ago Stage 3b chronic kidney disease (HCC)   [facility] Valerio Melanie DASEN, NP       Future Appointments             In 3 weeks Fernand Denyse LABOR, MD [facility]

## 2023-12-20 ENCOUNTER — Ambulatory Visit
Admission: RE | Admit: 2023-12-20 | Discharge: 2023-12-20 | Disposition: A | Discharge status: Home / Self Care | Source: Ambulatory Visit | Attending: Nurse Practitioner | Admitting: Nurse Practitioner

## 2023-12-20 DIAGNOSIS — R92313 Mammographic fatty tissue density, bilateral breasts: Secondary | ICD-10-CM | POA: Diagnosis not present

## 2023-12-20 DIAGNOSIS — R928 Other abnormal and inconclusive findings on diagnostic imaging of breast: Secondary | ICD-10-CM | POA: Diagnosis not present

## 2023-12-20 DIAGNOSIS — N6312 Unspecified lump in the right breast, upper inner quadrant: Secondary | ICD-10-CM | POA: Diagnosis not present

## 2023-12-23 ENCOUNTER — Ambulatory Visit
Admission: RE | Admit: 2023-12-23 | Discharge: 2023-12-23 | Disposition: A | Discharge status: Home / Self Care | Source: Ambulatory Visit | Attending: Nurse Practitioner | Admitting: Nurse Practitioner

## 2023-12-23 DIAGNOSIS — R928 Other abnormal and inconclusive findings on diagnostic imaging of breast: Secondary | ICD-10-CM | POA: Diagnosis not present

## 2023-12-23 DIAGNOSIS — N641 Fat necrosis of breast: Secondary | ICD-10-CM | POA: Insufficient documentation

## 2023-12-23 DIAGNOSIS — N6312 Unspecified lump in the right breast, upper inner quadrant: Secondary | ICD-10-CM | POA: Diagnosis not present

## 2023-12-23 HISTORY — PX: BREAST BIOPSY: SHX20

## 2023-12-23 MED ORDER — LIDOCAINE 1 % OPTIME INJ - NO CHARGE
1.0000 mL | Freq: Once | INTRAMUSCULAR | Status: AC
  Administered 2023-12-23: 1 mL
  Filled 2023-12-23: qty 2

## 2023-12-23 MED ORDER — LIDOCAINE-EPINEPHRINE 1 %-1:100000 IJ SOLN
5.0000 mL | Freq: Once | INTRAMUSCULAR | Status: AC
  Administered 2023-12-23: 5 mL
  Filled 2023-12-23: qty 5

## 2023-12-24 LAB — SURGICAL PATHOLOGY

## 2024-01-09 ENCOUNTER — Other Ambulatory Visit: Payer: Self-pay | Admitting: Cardiovascular Disease

## 2024-01-09 DIAGNOSIS — I482 Chronic atrial fibrillation, unspecified: Secondary | ICD-10-CM

## 2024-01-10 ENCOUNTER — Ambulatory Visit (INDEPENDENT_AMBULATORY_CARE_PROVIDER_SITE_OTHER): Admitting: Cardiovascular Disease

## 2024-01-10 ENCOUNTER — Encounter: Payer: Self-pay | Admitting: Cardiovascular Disease

## 2024-01-10 VITALS — BP 112/68 | HR 101 | Ht 59.0 in | Wt 160.6 lb

## 2024-01-10 DIAGNOSIS — I10 Essential (primary) hypertension: Secondary | ICD-10-CM | POA: Diagnosis not present

## 2024-01-10 DIAGNOSIS — M25551 Pain in right hip: Secondary | ICD-10-CM | POA: Diagnosis not present

## 2024-01-10 DIAGNOSIS — I340 Nonrheumatic mitral (valve) insufficiency: Secondary | ICD-10-CM | POA: Diagnosis not present

## 2024-01-10 DIAGNOSIS — I482 Chronic atrial fibrillation, unspecified: Secondary | ICD-10-CM | POA: Diagnosis not present

## 2024-01-10 DIAGNOSIS — G8929 Other chronic pain: Secondary | ICD-10-CM

## 2024-01-10 DIAGNOSIS — I700 Atherosclerosis of aorta: Secondary | ICD-10-CM | POA: Diagnosis not present

## 2024-01-10 DIAGNOSIS — Z7901 Long term (current) use of anticoagulants: Secondary | ICD-10-CM
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
## 2024-02-03 NOTE — Patient Instructions (Signed)
 Be Involved in Caring For Your Health:  Taking Medications When medications are taken as directed, they can greatly improve your health. But if they are not taken as prescribed, they may not work. In some cases, not taking them correctly can be harmful. To help ensure your treatment remains effective and safe, understand your medications and how to take them. Bring your medications to each visit for review by your provider.  Your lab results, notes, and after visit summary will be available on My Chart. We strongly encourage you to use this feature. If lab results are abnormal the clinic will contact you with the appropriate steps. If the clinic does not contact you assume the results are satisfactory. You can always view your results on My Chart. If you have questions regarding your health or results, please contact the clinic during office hours. You can also ask questions on My Chart.  We at [facility] are grateful that you chose [place] to provide your care. We strive to provide evidence-based and compassionate care and are always looking for feedback. If you get a survey from the clinic please complete this so we can hear your opinions.  Heart-Healthy Eating Plan Many factors influence your heart health, including eating and exercise habits. Heart health is also called coronary health. Coronary risk increases with abnormal blood fat (lipid) levels. A heart-healthy eating plan includes limiting unhealthy fats, increasing healthy fats, limiting salt (sodium) intake, and making other diet and lifestyle changes. What is my plan? Your health care provider may recommend that: You limit your fat intake to _________% or less of your total calories each day. You limit your saturated fat intake to _________% or less of your total calories each day. You limit the amount of cholesterol in your diet to less than _________ mg per day. You limit the amount of sodium in your diet to less than _________  mg per day. What are tips for following this plan? Cooking Cook foods using methods other than frying. Baking, boiling, grilling, and broiling are all good options. Other ways to reduce fat include: Removing the skin from poultry. Removing all visible fats from meats. Steaming vegetables in water or broth. Meal planning  At meals, imagine dividing your plate into fourths: Fill one-half of your plate with vegetables and green salads. Fill one-fourth of your plate with whole grains. Fill one-fourth of your plate with lean protein foods. Eat 2-4 cups of vegetables per day. One cup of vegetables equals 1 cup (91 g) broccoli or cauliflower florets, 2 medium carrots, 1 large bell pepper, 1 large sweet potato, 1 large tomato, 1 medium white potato, 2 cups (150 g) raw leafy greens. Eat 1-2 cups of fruit per day. One cup of fruit equals 1 small apple, 1 large banana, 1 cup (237 g) mixed fruit, 1 large orange,  cup (82 g) dried fruit, 1 cup (240 mL) 100% fruit juice. Eat more foods that contain soluble fiber. Examples include apples, broccoli, carrots, beans, peas, and barley. Aim to get 25-30 g of fiber per day. Increase your consumption of legumes, nuts, and seeds to 4-5 servings per week. One serving of dried beans or legumes equals  cup (90 g) cooked, 1 serving of nuts is  oz (12 almonds, 24 pistachios, or 7 walnut halves), and 1 serving of seeds equals  oz (8 g). Fats Choose healthy fats more often. Choose monounsaturated and polyunsaturated fats, such as olive and canola oils, avocado oil, flaxseeds, walnuts, almonds, and seeds. Eat  more omega-3 fats. Choose salmon, mackerel, sardines, tuna, flaxseed oil, and ground flaxseeds. Aim to eat fish at least 2 times each week. Check food labels carefully to identify foods with trans fats or high amounts of saturated fat. Limit saturated fats. These are found in animal products, such as meats, butter, and cream. Plant sources of saturated fats  include palm oil, palm kernel oil, and coconut oil. Avoid foods with partially hydrogenated oils in them. These contain trans fats. Examples are stick margarine, some tub margarines, cookies, crackers, and other baked goods. Avoid fried foods. General information Eat more home-cooked food and less restaurant, buffet, and fast food. Limit or avoid alcohol. Limit foods that are high in added sugar and simple starches such as foods made using white refined flour (white breads, pastries, sweets). Lose weight if you are overweight. Losing just 5-10% of your body weight can help your overall health and prevent diseases such as diabetes and heart disease. Monitor your sodium intake, especially if you have high blood pressure. Talk with your health care provider about your sodium intake. Try to incorporate more vegetarian meals weekly. What foods should I eat? Fruits All fresh, canned (in natural juice), or frozen fruits. Vegetables Fresh or frozen vegetables (raw, steamed, roasted, or grilled). Green salads. Grains Most grains. Choose whole wheat and whole grains most of the time. Rice and pasta, including brown rice and pastas made with whole wheat. Meats and other proteins Lean, well-trimmed beef, veal, pork, and lamb. Chicken and [place] without skin. All fish and shellfish. Wild duck, rabbit, pheasant, and venison. Egg whites or low-cholesterol egg substitutes. Dried beans, peas, lentils, and tofu. Seeds and most nuts. Dairy Low-fat or nonfat cheeses, including ricotta and mozzarella. Skim or 1% milk (liquid, powdered, or evaporated). Buttermilk made with low-fat milk. Nonfat or low-fat yogurt. Fats and oils Non-hydrogenated (trans-free) margarines. Vegetable oils, including soybean, sesame, sunflower, olive, avocado, peanut, safflower, corn, canola, and cottonseed. Salad dressings or mayonnaise made with a vegetable oil. Beverages Water (mineral or sparkling). Coffee and tea. Unsweetened ice  tea. Diet beverages. Sweets and desserts Sherbet, gelatin, and fruit ice. Small amounts of dark chocolate. Limit all sweets and desserts. Seasonings and condiments All seasonings and condiments. The items listed above may not be a complete list of foods and beverages you can eat. Contact a dietitian for more options. What foods should I avoid? Fruits Canned fruit in heavy syrup. Fruit in cream or butter sauce. Fried fruit. Limit coconut. Vegetables Vegetables cooked in cheese, cream, or butter sauce. Fried vegetables. Grains Breads made with saturated or trans fats, oils, or whole milk. Croissants. Sweet rolls. Donuts. High-fat crackers, such as cheese crackers and chips. Meats and other proteins Fatty meats, such as hot dogs, ribs, sausage, bacon, rib-eye roast or steak. High-fat deli meats, such as salami and bologna. Caviar. Domestic duck and goose. Organ meats, such as liver. Dairy Cream, sour cream, cream cheese, and creamed cottage cheese. Whole-milk cheeses. Whole or 2% milk (liquid, evaporated, or condensed). Whole buttermilk. Cream sauce or high-fat cheese sauce. Whole-milk yogurt. Fats and oils Meat fat, or shortening. Cocoa butter, hydrogenated oils, palm oil, coconut oil, palm kernel oil. Solid fats and shortenings, including bacon fat, salt pork, lard, and butter. Nondairy cream substitutes. Salad dressings with cheese or sour cream. Beverages Regular sodas and any drinks with added sugar. Sweets and desserts Frosting. Pudding. Cookies. Cakes. Pies. Milk chocolate or white chocolate. Buttered syrups. Full-fat ice cream or ice cream drinks. The items listed above may  not be a complete list of foods and beverages to avoid. Contact a dietitian for more information. Summary Heart-healthy meal planning includes limiting unhealthy fats, increasing healthy fats, limiting salt (sodium) intake and making other diet and lifestyle changes. Lose weight if you are overweight. Losing just  5-10% of your body weight can help your overall health and prevent diseases such as diabetes and heart disease. Focus on eating a balance of foods, including fruits and vegetables, low-fat or nonfat dairy, lean protein, nuts and legumes, whole grains, and heart-healthy oils and fats. This information is not intended to replace advice given to you by your health care provider. Make sure you discuss any questions you have with your health care provider. Document Revised: 05/08/2021 Document Reviewed: 05/08/2021 Elsevier Patient Education  2024 [employer].

## 2024-02-04 ENCOUNTER — Other Ambulatory Visit: Payer: Self-pay | Admitting: Cardiovascular Disease

## 2024-02-04 ENCOUNTER — Ambulatory Visit: Payer: Self-pay | Admitting: Emergency Medicine

## 2024-02-04 VITALS — Ht 59.0 in | Wt 160.0 lb

## 2024-02-04 DIAGNOSIS — I482 Chronic atrial fibrillation, unspecified: Secondary | ICD-10-CM

## 2024-02-04 DIAGNOSIS — E782 Mixed hyperlipidemia: Secondary | ICD-10-CM

## 2024-02-04 DIAGNOSIS — I340 Nonrheumatic mitral (valve) insufficiency: Secondary | ICD-10-CM

## 2024-02-04 DIAGNOSIS — R0602 Shortness of breath: Secondary | ICD-10-CM

## 2024-02-04 DIAGNOSIS — Z0000 Encounter for general adult medical examination without abnormal findings: Secondary | ICD-10-CM

## 2024-02-04 DIAGNOSIS — I10 Essential (primary) hypertension: Secondary | ICD-10-CM

## 2024-02-04 DIAGNOSIS — I700 Atherosclerosis of aorta: Secondary | ICD-10-CM
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
## 2024-02-04 NOTE — Patient Instructions (Signed)
 [name],  Thank you for taking the time for your Medicare Wellness Visit. I appreciate your continued commitment to your health goals. Please review the care plan we discussed, and feel free to reach out if I can assist you further.  Medicare recommends these wellness visits once per year to help you and your care team stay ahead of potential health issues. These visits are designed to focus on prevention, allowing your provider to concentrate on managing your acute and chronic conditions during your regular appointments.  Please note that Annual Wellness Visits do not include a physical exam. Some assessments may be limited, especially if the visit was conducted virtually. If needed, we may recommend a separate in-person follow-up with your provider.  Ongoing Care Seeing your primary care provider every 3 to 6 months helps us  monitor your health and provide consistent, personalized care.   Referrals If a referral was made during today's visit and you haven't received any updates within two weeks, please contact the referred provider directly to check on the status.  Recommended Screenings:  Health Maintenance  Topic Date Due   Flu Shot  11/15/2023   COVID-19 Vaccine (4 - 2025-26 season) 02/19/2024*   Zoster (Shingles) Vaccine (1 of 2) 05/05/2024*   Breast Cancer Screening  12/10/2024   Medicare Annual Wellness Visit  02/03/2025   DEXA scan (bone density measurement)  02/15/2026   DTaP/Tdap/Td vaccine (3 - Td or Tdap) 06/20/2028   Pneumococcal Vaccine for age over 45  Completed   Hepatitis C Screening  Completed   Meningitis B Vaccine  Aged Out   Colon Cancer Screening  Discontinued  *Topic was postponed. The date shown is not the original due date.       02/04/2024    1:16 PM  Advanced Directives  Does Patient Have a Medical Advance Directive? Yes  Type of Estate agent of [place];Living will  Does patient want to make changes to medical advance  directive? No - Patient declined  Copy of Healthcare Power of Attorney in Chart? Yes - validated most recent copy scanned in chart (See row information)   Advance Care Planning is important because it: Ensures you receive medical care that aligns with your values, goals, and preferences. Provides guidance to your family and loved ones, reducing the emotional burden of decision-making during critical moments.  Vision: Annual vision screenings are recommended for early detection of glaucoma, cataracts, and diabetic retinopathy. These exams can also reveal signs of chronic conditions such as diabetes and high blood pressure.  Dental: Annual dental screenings help detect early signs of oral cancer, gum disease, and other conditions linked to overall health, including heart disease and diabetes.  Please see the attached documents for additional preventive care recommendations.   Fall Prevention in the Home, Adult Falls can cause injuries and affect people of all ages. There are many simple things that you can do to make your home safe and to help prevent falls. If you need it, ask for help making these changes. What actions can I take to prevent falls? General information Use good lighting in all rooms. Make sure to: Replace any light bulbs that burn out. Turn on lights if it is dark and use night-lights. Keep items that you use often in easy-to-reach places. Lower the shelves around your home if needed. Move furniture so that there are clear paths around it. Do not keep throw rugs or other things on the floor that can make you trip. If any of  your floors are uneven, fix them. Add color or contrast paint or tape to clearly mark and help you see: Grab bars or handrails. First and last steps of staircases. Where the edge of each step is. If you use a ladder or stepladder: Make sure that it is fully opened. Do not climb a closed ladder. Make sure the sides of the ladder are locked in  place. Have someone hold the ladder while you use it. Know where your pets are as you move through your home. What can I do in the bathroom?     Keep the floor dry. Clean up any water that is on the floor right away. Remove soap buildup in the bathtub or shower. Buildup makes bathtubs and showers slippery. Use non-skid mats or decals on the floor of the bathtub or shower. Attach bath mats securely with double-sided, non-slip rug tape. If you need to sit down while you are in the shower, use a non-slip stool. Install grab bars by the toilet and in the bathtub and shower. Do not use towel bars as grab bars. What can I do in the bedroom? Make sure that you have a light by your bed that is easy to reach. Do not use any sheets or blankets on your bed that hang to the floor. Have a firm bench or chair with side arms that you can use for support when you get dressed. What can I do in the kitchen? Clean up any spills right away. If you need to reach something above you, use a sturdy step stool that has a grab bar. Keep electrical cables out of the way. Do not use floor polish or wax that makes floors slippery. What can I do with my stairs? Do not leave anything on the stairs. Make sure that you have a light switch at the top and the bottom of the stairs. Have them installed if you do not have them. Make sure that there are handrails on both sides of the stairs. Fix handrails that are broken or loose. Make sure that handrails are as long as the staircases. Install non-slip stair treads on all stairs in your home if they do not have carpet. Avoid having throw rugs at the top or bottom of stairs, or secure the rugs with carpet tape to prevent them from moving. Choose a carpet design that does not hide the edge of steps on the stairs. Make sure that carpet is firmly attached to the stairs. Fix any carpet that is loose or worn. What can I do on the outside of my home? Use bright outdoor  lighting. Repair the edges of walkways and driveways and fix any cracks. Clear paths of anything that can make you trip, such as tools or rocks. Add color or contrast paint or tape to clearly mark and help you see high doorway thresholds. Trim any bushes or trees on the main path into your home. Check that handrails are securely fastened and in good repair. Both sides of all steps should have handrails. Install guardrails along the edges of any raised decks or porches. Have leaves, snow, and ice cleared regularly. Use sand, salt, or ice melt on walkways during winter months if you live where there is ice and snow. In the garage, clean up any spills right away, including grease or oil spills. What other actions can I take? Review your medicines with your health care provider. Some medicines can make you confused or feel dizzy. This can increase your chance  of falling. Wear closed-toe shoes that fit well and support your feet. Wear shoes that have rubber soles and low heels. Use a cane, walker, scooter, or crutches that help you move around if needed. Talk with your provider about other ways that you can decrease your risk of falls. This may include seeing a physical therapist to learn to do exercises to improve movement and strength. Where to find more information Centers for Disease Control and Prevention, STEADI: TonerPromos.no [employer] on Aging: BaseRingTones.pl National Institute on Aging: BaseRingTones.pl Contact a health care provider if: You are afraid of falling at home. You feel weak, drowsy, or dizzy at home. You fall at home. Get help right away if you: Lose consciousness or have trouble moving after a fall. Have a fall that causes a head injury. These symptoms may be an emergency. Get help right away. Call 911. Do not wait to see if the symptoms will go away. Do not drive yourself to the hospital. This information is not intended to replace advice given to you by your health care  provider. Make sure you discuss any questions you have with your health care provider. Document Revised: 12/04/2021 Document Reviewed: 12/04/2021 Elsevier Patient Education  2024 [employer].

## 2024-02-06 ENCOUNTER — Encounter: Payer: Self-pay | Admitting: Nurse Practitioner

## 2024-02-06 ENCOUNTER — Ambulatory Visit (INDEPENDENT_AMBULATORY_CARE_PROVIDER_SITE_OTHER): Admitting: Nurse Practitioner

## 2024-02-06 VITALS — BP 126/72 | HR 86 | Temp 97.6°F | Ht 59.0 in | Wt 160.2 lb

## 2024-02-06 DIAGNOSIS — N1832 Chronic kidney disease, stage 3b: Secondary | ICD-10-CM | POA: Diagnosis not present

## 2024-02-06 DIAGNOSIS — Z23 Encounter for immunization: Secondary | ICD-10-CM | POA: Diagnosis not present

## 2024-02-06 DIAGNOSIS — M1A3720 Chronic gout due to renal impairment, left ankle and foot, without tophus (tophi): Secondary | ICD-10-CM

## 2024-02-06 DIAGNOSIS — I482 Chronic atrial fibrillation, unspecified: Secondary | ICD-10-CM

## 2024-02-06 DIAGNOSIS — D696 Thrombocytopenia, unspecified: Secondary | ICD-10-CM

## 2024-02-06 DIAGNOSIS — E66811 Obesity, class 1: Secondary | ICD-10-CM

## 2024-02-06 DIAGNOSIS — M85852 Other specified disorders of bone density and structure, left thigh: Secondary | ICD-10-CM | POA: Diagnosis not present

## 2024-02-06 DIAGNOSIS — I700 Atherosclerosis of aorta: Secondary | ICD-10-CM | POA: Diagnosis not present

## 2024-02-06 DIAGNOSIS — E782 Mixed hyperlipidemia: Secondary | ICD-10-CM | POA: Diagnosis not present

## 2024-02-06 DIAGNOSIS — E063 Autoimmune thyroiditis: Secondary | ICD-10-CM | POA: Diagnosis not present

## 2024-02-06 DIAGNOSIS — E538 Deficiency of other specified B group vitamins: Secondary | ICD-10-CM

## 2024-02-06 DIAGNOSIS — I10 Essential (primary) hypertension: Secondary | ICD-10-CM | POA: Diagnosis not present

## 2024-02-06 DIAGNOSIS — E559 Vitamin D deficiency, unspecified: Secondary | ICD-10-CM

## 2024-02-06 MED ORDER — LEVOTHYROXINE SODIUM 75 MCG PO TABS: 75.0000 ug | ORAL_TABLET | Freq: Every day | ORAL | 4 refills | Status: AC

## 2024-02-06 MED ORDER — ROSUVASTATIN CALCIUM 20 MG PO TABS: 20.0000 mg | ORAL_TABLET | Freq: Every day | ORAL | 4 refills | Status: AC

## 2024-02-06 NOTE — Assessment & Plan Note (Signed)
 BMI 32.36 with a-fib and HTN + CKD.  Recommended eating smaller high protein, low fat meals more frequently and exercising 30 mins a day 5 times a week with a goal of 10-15lb weight loss in the next 3 months. Patient voiced their understanding and motivation to adhere to these recommendations.

## 2024-02-06 NOTE — Assessment & Plan Note (Signed)
 Chronic, ongoing.  On 2017 DEXA and repeat in November 2022 (T-score -1.7).  Continue supplements at home and gentle weight bearing exercises.  Check Vit D leve. Continue Evista  holiday.  Plan on recheck DEXA in November 2027.

## 2024-02-06 NOTE — Assessment & Plan Note (Signed)
 Chronic, ongoing.  Continue collaboration with nephrology.  Taking ACE, may benefit Farxiga in future for further kidney protection but concern about affordability and increased urination.  Appears to be Tier 5 and she is on tight budget.  Could look into if any assistance with this if needed in future.  Labs up to date.  Renal dose medications as needed.

## 2024-02-06 NOTE — Assessment & Plan Note (Signed)
 Ongoing.  Noted on on past labs intermittently.  Continue to monitor.  CBC up to date.

## 2024-02-06 NOTE — Progress Notes (Signed)
 BP 126/72 (BP Location: Left Arm, Patient Position: Sitting)   Pulse 86   Temp 97.6 F (36.4 C) (Oral)   Ht 4' 11 (1.499 m)   Wt 160 lb 3.2 oz (72.7 kg)   LMP  (LMP Unknown)   SpO2 99%   BMI 32.36 kg/m    Subjective:    Patient ID: Amy Myers, female    DOB: [birth year], 78 y.o.   MRN: [id number]  HPI: Amy Myers is a 78 y.o. female  Chief Complaint  Patient presents with   Hyperlipidemia   Hypertension   Chronic Kidney Disease   Atrial Fibrillation   Hypothyroidism   HYPERTENSION / HYPERLIPIDEMIA  Last saw cardiology on 01/10/24 with no changes, they were going to start Farxiga but she has issues holding water at baseline. Takes Metoprolol , Rosuvastatin , Lisinopril , + Eliquis .  Echo August 2024 EF 76.8% and had stress test at the time.  History of aortic atherosclerosis noted on past imaging. Satisfied with current treatment? yes Duration of hypertension: chronic BP monitoring frequency: not checking BP range:  BP medication side effects: no Duration of hyperlipidemia: chronic Cholesterol medication side effects: no Cholesterol supplements: none Medication compliance: good compliance Aspirin: no Recent stressors: no Recurrent headaches: no Visual changes: no Palpitations: sometimes Dyspnea: no Chest pain: no Lower extremity edema: no Dizzy/lightheaded: no  The 10-year ASCVD risk score (Arnett DK, et al., 2019) is: 33%   Values used to calculate the score:     Age: 3 years     Clincally relevant sex: Female     Is Non-Hispanic African American: No     Diabetic: No     Tobacco smoker: No     Systolic Blood Pressure: 126 mmHg     Is BP treated: Yes     HDL Cholesterol: 85 mg/dL     Total Cholesterol: 137 mg/dL  ATRIAL FIBRILLATION As above, follows with cardiology. Atrial fibrillation status: stable Satisfied with current treatment: yes  Medication side effects:  no Medication compliance: good compliance Etiology of atrial fibrillation:  unknown Palpitations:  sometimes Chest pain:  no Dyspnea on exertion:  no Orthopnea:  no Syncope:  no Edema:  no Ventricular rate control: B-blocker Anti-coagulation: long acting    CHRONIC KIDNEY DISEASE Nephrology visit 11/05/23 with CRT 1.4 and GFR 38, mild decline. Continues Allopurinol  for gout, has not had a flare for many years. CKD status: stable Medications renally dose: yes Previous renal evaluation: yes Pneumovax:  Up to Date Influenza Vaccine:  Up to Date   HYPOTHYROIDISM Continues Levothyroxine  75 MCG.   Thyroid  control status:stable Satisfied with current treatment? yes Medication side effects: no Medication compliance: good compliance Etiology of hypothyroidism:  Recent dose adjustment:no Fatigue: no Cold intolerance: no Heat intolerance: no Weight gain: no Weight loss: no Constipation: off and on Diarrhea/loose stools: off and on Palpitations: no Lower extremity edema: no Anxiety/depressed mood: no    OSTEOPENIA DEXA 02/15/21 noted T-score -1.7. Continues Vitamin D  at home. Satisfied with current treatment?: yes Adequate calcium  & vitamin D : yes Intolerance to bisphosphonates: Evista  on holiday Weight bearing exercises: yes     02/04/2024    1:13 PM 01/29/2023    3:05 PM 01/29/2023    9:19 AM 08/13/2022   10:54 AM 03/14/2022    8:39 AM  Depression screen PHQ 2/9  Decreased Interest 0 0 0 0 0  Down, Depressed, Hopeless 0 0 0 0 0  PHQ - 2 Score 0 0 0 0 0  Altered sleeping  0 0 0 0 0  Tired, decreased energy 1 0 0 0 0  Change in appetite 0 0 0 0 0  Feeling bad or failure about yourself  0 0 0 0 0  Trouble concentrating 0 0 0 0 0  Moving slowly or fidgety/restless 0 0 0 0 0  Suicidal thoughts 0 0 0 0 0  PHQ-9 Score 1 0 0 0 0  Difficult doing work/chores Not difficult at all Not difficult at all Not difficult at all Not difficult at all Not difficult at all       01/29/2023    9:19 AM 08/13/2022   10:55 AM 01/31/2022   10:02 AM 08/04/2021     9:53 AM  GAD 7 : Generalized Anxiety Score  Nervous, Anxious, on Edge 0 0 0 0  Control/stop worrying 0 0 0 0  Worry too much - different things 0 0 0 0  Trouble relaxing 0 0 0 0  Restless 0 0 0 0  Easily annoyed or irritable 0 0 0 0  Afraid - awful might happen 0 0 0 0  Total GAD 7 Score 0 0 0 0  Anxiety Difficulty Not difficult at all Not difficult at all Not difficult at all Not difficult at all      Relevant past medical, surgical, family and social history reviewed and updated as indicated. Interim medical history since our last visit reviewed. Allergies and medications reviewed and updated.  Review of Systems  Constitutional:  Negative for activity change, appetite change, diaphoresis, fatigue and unexpected weight change.  Respiratory:  Negative for cough, chest tightness, shortness of breath and wheezing.   Cardiovascular:  Negative for chest pain, palpitations and leg swelling.  Endocrine: Negative for cold intolerance and heat intolerance.  Neurological: Negative.   Psychiatric/Behavioral: Negative.      Per HPI unless specifically indicated above     Objective:    BP 126/72 (BP Location: Left Arm, Patient Position: Sitting)   Pulse 86   Temp 97.6 F (36.4 C) (Oral)   Ht 4' 11 (1.499 m)   Wt 160 lb 3.2 oz (72.7 kg)   LMP  (LMP Unknown)   SpO2 99%   BMI 32.36 kg/m   Wt Readings from Last 3 Encounters:  02/06/24 160 lb 3.2 oz (72.7 kg)  02/04/24 160 lb (72.6 kg)  01/10/24 160 lb 9.6 oz (72.8 kg)    Physical Exam Vitals and nursing note reviewed.  Constitutional:      General: She is awake. She is not in acute distress.    Appearance: She is well-developed and well-groomed. She is obese. She is not ill-appearing or toxic-appearing.     Comments: Using cane per baseline.  HENT:     Head: Normocephalic.     Right Ear: Hearing normal.     Left Ear: Hearing normal.  Eyes:     General: Lids are normal.        Right eye: No discharge.        Left eye: No  discharge.     Conjunctiva/sclera: Conjunctivae normal.     Pupils: Pupils are equal, round, and reactive to light.  Neck:     Thyroid : No thyromegaly.     Vascular: No carotid bruit.  Cardiovascular:     Rate and Rhythm: Normal rate. Rhythm irregularly irregular.     Heart sounds: Murmur heard.     Systolic murmur is present with a grade of 2/6.     No gallop.  Pulmonary:     Effort: Pulmonary effort is normal. No accessory muscle usage or respiratory distress.     Breath sounds: Normal breath sounds.  Abdominal:     General: Bowel sounds are normal.     Palpations: Abdomen is soft.  Musculoskeletal:     Cervical back: Normal range of motion and neck supple.     Right lower leg: No edema.     Left lower leg: No edema.  Skin:    General: Skin is warm and dry.  Neurological:     Mental Status: She is alert and oriented to person, place, and time.  Psychiatric:        Attention and Perception: Attention normal.        Mood and Affect: Mood normal.        Behavior: Behavior normal. Behavior is cooperative.        Thought Content: Thought content normal.        Judgment: Judgment normal.    Results for orders placed or performed during the hospital encounter of 12/23/23  Surgical pathology   Collection Time: 12/23/23 12:00 AM  Result Value Ref Range   SURGICAL PATHOLOGY      SURGICAL PATHOLOGY [facility] [address] [place], [place] [address] Telephone [phone] or [phone] Fax [phone]  REPORT OF SURGICAL PATHOLOGY   Accession #: [id number] Patient Name: Amy Myers, Amy Myers Visit # : [id number]  MRN: [id number] Physician: Correne Krabbe DOB/Age [birth year] (Age: 23) Gender: F Collected Date: 12/23/2023 Received Date: 12/23/2023  FINAL DIAGNOSIS       1. 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       DATE SIGNED OUT: 12/24/2023 ELECTRONIC  SIGNATURE : Legolvan Do, Mark, Pathologist, Electronic Signature  MICROSCOPIC DESCRIPTION  CASE COMMENTS STAINS USED IN DIAGNOSIS: H&E-2 H&E-3 H&E-4 H&E    CLINICAL HISTORY  SPECIMEN(S) OBTAINED 1. Breast, right, needle core biopsy, 2:00 10 Cmfn Heart  SPECIMEN COMMENTS: 1. TIF: 9:02 am, CIT: < 30 sec, indeterminate asymmetry, hx of ipsilateral dystrophic calcs  SPECIMEN CLINICAL INFORMATION: 1. Fat necrosis, malignancy    Gross Description 1. Received in formalin, TIF 9:02 a.m. and CIT less than 30 seconds, are four cores of soft tan yellow tissue, 0.6 to 0.8 cm in length and each 0.2 cm in diameter.  The specimen is submitted in toto.  (GP:gt, 12/23/23)        Report signed out from the following location(s) [place] [address] [place], [place] [address] CLIA #: [id number]  [facility] [address] [place], [place] [address] CLIA #: [id number]       Assessment & Plan:   Problem List Items Addressed This Visit       Cardiovascular and Mediastinum   Hypertension   Chronic, stable.  BP at goal at in office.  Followed by cardiology and nephrology.  Continue current medication regimen and adjust as needed.  Continue collaboration with cardiologist and nephrologist.  LABS: up to date with nephrology.  Recommend she continue to monitor BP at home a few days a week and document + focus on DASH diet.  Return in 6 months.      Relevant Medications   rosuvastatin  (CRESTOR ) 20 MG tablet   Chronic atrial fibrillation (HCC)   Chronic, rate controlled.  Continue current medication regimen and collaboration with cardiology (  Dr. Fernand).  Recent notes and imaging reviewed in chart.      Relevant Medications   rosuvastatin  (CRESTOR ) 20 MG tablet   Aortic atherosclerosis   Chronic.  Noted on CT imaging 10/04/17.  Educated patient, continue statin and Eliquis  daily.  Recommend focus on healthy diet and regular activity.       Relevant Medications   rosuvastatin  (CRESTOR ) 20 MG tablet     Endocrine   Hashimoto's thyroiditis   Chronic, ongoing.  Continue current medication regimen and adjust as needed.  Thyroid  labs today.  ATA guidelines recommend goal for age <6.        Relevant Medications   levothyroxine  (SYNTHROID ) 75 MCG tablet   Other Relevant Orders   T4, free   TSH     Musculoskeletal and Integument   Osteopenia of neck of left femur   Chronic, ongoing.  On 2017 DEXA and repeat in November 2022 (T-score -1.7).  Continue supplements at home and gentle weight bearing exercises.  Check Vit D leve. Continue Evista  holiday.  Plan on recheck DEXA in November 2027.      Relevant Orders   VITAMIN D  25 Hydroxy (Vit-D Deficiency, Fractures)     Genitourinary   Stage 3 chronic kidney disease (HCC) - Primary   Chronic, ongoing.  Continue collaboration with nephrology.  Taking ACE, may benefit Farxiga in future for further kidney protection but concern about affordability and increased urination.  Appears to be Tier 5 and she is on tight budget.  Could look into if any assistance with this if needed in future.  Labs up to date.  Renal dose medications as needed.        Hematopoietic and Hemostatic   Thrombocytopenia   Ongoing.  Noted on on past labs intermittently.  Continue to monitor.  CBC up to date.        Other   Vitamin B12 deficiency   Currently taking daily supplement. Will check B12 level and adjust regimen as needed.       Relevant Orders   Vitamin B12   Obesity   BMI 32.36 with a-fib and HTN + CKD.  Recommended eating smaller high protein, low fat meals more frequently and exercising 30 mins a day 5 times a week with a goal of 10-15lb weight loss in the next 3 months. Patient voiced their understanding and motivation to adhere to these recommendations.        Hyperlipidemia   Chronic, ongoing.  Continue current medication regimen and adjust as needed based on labs.  Lipid panel  today.      Relevant Medications   rosuvastatin  (CRESTOR ) 20 MG tablet   Other Relevant Orders   Lipid Panel w/o Chol/HDL Ratio   Gout   Chronic, stable with no recent flares.  Continue renal dosed Allopurinol  and check uric acid level.      Relevant Orders   Uric acid   Other Visit Diagnoses       Flu vaccine need       Flu vaccine today, educated patient.   Relevant Orders   Flu vaccine HIGH DOSE PF(Fluzone Trivalent) (Completed)        Follow up plan: Return in about 6 months (around 08/06/2024) for HTN/HLD, Hypothyroid, CKD, A-FIB.

## 2024-02-06 NOTE — Assessment & Plan Note (Signed)
Chronic, ongoing.  Continue current medication regimen and adjust as needed.  Thyroid labs today.  ATA guidelines recommend goal for age <6.

## 2024-02-06 NOTE — Assessment & Plan Note (Signed)
Chronic, rate controlled.  Continue current medication regimen and collaboration with cardiology (Dr. Welton Flakes).  Recent notes and imaging reviewed in chart.

## 2024-02-06 NOTE — Assessment & Plan Note (Signed)
Currently taking daily supplement. Will check B12 level and adjust regimen as needed.

## 2024-02-06 NOTE — Assessment & Plan Note (Signed)
 Chronic, ongoing.  Continue current medication regimen and adjust as needed based on labs.  Lipid panel today.

## 2024-02-06 NOTE — Assessment & Plan Note (Signed)
 Chronic, stable.  BP at goal at in office.  Followed by cardiology and nephrology.  Continue current medication regimen and adjust as needed.  Continue collaboration with cardiologist and nephrologist.  LABS: up to date with nephrology.  Recommend she continue to monitor BP at home a few days a week and document + focus on DASH diet.  Return in 6 months.

## 2024-02-06 NOTE — Assessment & Plan Note (Signed)
Chronic.  Noted on CT imaging 10/04/17.  Educated patient, continue statin and Eliquis daily.  Recommend focus on healthy diet and regular activity. 

## 2024-02-06 NOTE — Assessment & Plan Note (Addendum)
 Chronic, stable with no recent flares.  Continue renal dosed Allopurinol  and check uric acid level.

## 2024-02-07 ENCOUNTER — Ambulatory Visit: Payer: Self-pay | Admitting: Nurse Practitioner

## 2024-02-07 DIAGNOSIS — E063 Autoimmune thyroiditis: Secondary | ICD-10-CM

## 2024-02-07 LAB — LIPID PANEL W/O CHOL/HDL RATIO
Cholesterol, Total: 126 mg/dL (ref 100–199)
HDL: 70 mg/dL (ref >39)
LDL Chol Calc (NIH): 43 mg/dL (ref 0–99)
Triglycerides: 57 mg/dL (ref 0–149)
VLDL Cholesterol Cal: 13 mg/dL (ref 5–40)

## 2024-02-07 LAB — URIC ACID: Uric Acid: 4.2 mg/dL (ref 3.1–7.9)

## 2024-02-07 LAB — T4, FREE: Free T4: 1.61 ng/dL (ref 0.82–1.77)

## 2024-02-07 LAB — VITAMIN B12: Vitamin B-12: 1137 pg/mL (ref 232–1245)

## 2024-02-07 LAB — TSH: TSH: 0.286 u[IU]/mL — ABNORMAL LOW (ref 0.450–4.500)

## 2024-02-07 LAB — VITAMIN D 25 HYDROXY (VIT D DEFICIENCY, FRACTURES): Vit D, 25-Hydroxy: 47.9 ng/mL (ref 30.0–100.0)

## 2024-02-07 NOTE — Progress Notes (Signed)
 Good morning, please let Rhoda know her labs have returned (plus she needs lab only visit scheduled for 3 weeks) and overall are stable with exception of TSH, thyroid  lab, being a little low (or hyperactive range). I recommend continue current Levothyroxine  dose for now and then I would like to recheck in 3 weeks, if still low then we will adjust dose.  Staff will assist in scheduling a 3 week lab only visit.  Any questions? Keep being stellar!!  Thank you for allowing me to participate in your care.  I appreciate you. Kindest regards, Janie Capp

## 2024-03-02 ENCOUNTER — Other Ambulatory Visit

## 2024-03-02 DIAGNOSIS — E063 Autoimmune thyroiditis: Secondary | ICD-10-CM

## 2024-03-03 ENCOUNTER — Ambulatory Visit: Payer: Self-pay | Admitting: Nurse Practitioner

## 2024-03-03 LAB — TSH: TSH: 0.494 u[IU]/mL (ref 0.450–4.500)

## 2024-03-03 LAB — T4, FREE: Free T4: 1.91 ng/dL — ABNORMAL HIGH (ref 0.82–1.77)

## 2024-03-03 NOTE — Progress Notes (Signed)
 Please let Becky know her thyroid  labs have returned and TSH has improved, Free T4 a little elevated. We can continue Levothyroxine  75 MCG at present and recheck next visit. Any questions? Keep being stellar!!  Thank you for allowing me to participate in your care.  I appreciate you. Kindest regards, Bach Rocchi

## 2024-03-04 ENCOUNTER — Other Ambulatory Visit: Payer: Self-pay | Admitting: Cardiovascular Disease

## 2024-03-04 DIAGNOSIS — I482 Chronic atrial fibrillation, unspecified: Secondary | ICD-10-CM

## 2024-03-05 ENCOUNTER — Other Ambulatory Visit: Payer: Self-pay

## 2024-04-01 ENCOUNTER — Other Ambulatory Visit: Payer: Self-pay | Admitting: Cardiovascular Disease

## 2024-04-01 DIAGNOSIS — I482 Chronic atrial fibrillation, unspecified: Secondary | ICD-10-CM

## 2024-04-13 ENCOUNTER — Encounter: Payer: Self-pay | Admitting: Cardiovascular Disease

## 2024-04-13 ENCOUNTER — Ambulatory Visit: Admitting: Cardiovascular Disease

## 2024-04-13 VITALS — BP 132/86 | HR 112 | Ht 59.0 in | Wt 155.8 lb

## 2024-04-13 DIAGNOSIS — I482 Chronic atrial fibrillation, unspecified: Secondary | ICD-10-CM | POA: Diagnosis not present

## 2024-04-13 DIAGNOSIS — I340 Nonrheumatic mitral (valve) insufficiency: Secondary | ICD-10-CM | POA: Diagnosis not present

## 2024-04-13 DIAGNOSIS — R0602 Shortness of breath: Secondary | ICD-10-CM

## 2024-04-13 DIAGNOSIS — I10 Essential (primary) hypertension: Secondary | ICD-10-CM | POA: Diagnosis not present

## 2024-04-13 DIAGNOSIS — I700 Atherosclerosis of aorta: Secondary | ICD-10-CM

## 2024-04-13 DIAGNOSIS — Z7901 Long term (current) use of anticoagulants: Secondary | ICD-10-CM

## 2024-04-13 DIAGNOSIS — N1832 Chronic kidney disease, stage 3b: Secondary | ICD-10-CM | POA: Diagnosis not present

## 2024-04-13 DIAGNOSIS — E782 Mixed hyperlipidemia: Secondary | ICD-10-CM
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
## 2024-04-29 ENCOUNTER — Other Ambulatory Visit: Payer: Self-pay | Admitting: Cardiovascular Disease

## 2024-04-29 DIAGNOSIS — I482 Chronic atrial fibrillation, unspecified: Secondary | ICD-10-CM

## 2024-05-04 ENCOUNTER — Other Ambulatory Visit: Payer: Self-pay | Admitting: Cardiovascular Disease

## 2024-05-04 DIAGNOSIS — I700 Atherosclerosis of aorta: Secondary | ICD-10-CM

## 2024-05-04 DIAGNOSIS — I482 Chronic atrial fibrillation, unspecified: Secondary | ICD-10-CM

## 2024-05-04 DIAGNOSIS — E782 Mixed hyperlipidemia: Secondary | ICD-10-CM

## 2024-05-04 DIAGNOSIS — R0602 Shortness of breath: Secondary | ICD-10-CM

## 2024-05-04 DIAGNOSIS — I10 Essential (primary) hypertension: Secondary | ICD-10-CM

## 2024-05-04 DIAGNOSIS — I340 Nonrheumatic mitral (valve) insufficiency: Secondary | ICD-10-CM

## 2024-06-16 ENCOUNTER — Ambulatory Visit: Admitting: Cardiovascular Disease

## 2024-08-10 ENCOUNTER — Ambulatory Visit: Admitting: Nurse Practitioner

## 2025-02-09 ENCOUNTER — Ambulatory Visit
